# Patient Record
Sex: Female | Born: 1945
Health system: Southern US, Community
[De-identification: ages and names within clinical notes are randomized; demographics above are authoritative.]

## PROBLEM LIST (undated history)

## (undated) DIAGNOSIS — E785 Hyperlipidemia, unspecified: Secondary | ICD-10-CM

## (undated) DIAGNOSIS — I10 Essential (primary) hypertension: Secondary | ICD-10-CM

## (undated) DIAGNOSIS — K635 Polyp of colon: Secondary | ICD-10-CM

## (undated) DIAGNOSIS — M1990 Unspecified osteoarthritis, unspecified site: Secondary | ICD-10-CM

## (undated) DIAGNOSIS — T7840XA Allergy, unspecified, initial encounter: Secondary | ICD-10-CM

## (undated) HISTORY — DX: Essential (primary) hypertension: I10

## (undated) HISTORY — DX: Hyperlipidemia, unspecified: E78.5

## (undated) HISTORY — PX: FOOT SURGERY: SHX648

## (undated) HISTORY — DX: Allergy, unspecified, initial encounter: T78.40XA

## (undated) HISTORY — DX: Unspecified osteoarthritis, unspecified site: M19.90

## (undated) HISTORY — DX: Polyp of colon: K63.5

---

## 2004-10-05 ENCOUNTER — Other Ambulatory Visit: Admission: RE | Admit: 2004-10-05 | Discharge: 2004-10-05 | Payer: Self-pay | Admitting: Family Medicine

## 2004-10-30 ENCOUNTER — Encounter: Admission: RE | Admit: 2004-10-30 | Discharge: 2004-10-30 | Payer: Self-pay | Admitting: Family Medicine

## 2005-11-29 ENCOUNTER — Encounter: Admission: RE | Admit: 2005-11-29 | Discharge: 2005-11-29 | Payer: Self-pay | Admitting: Family Medicine

## 2005-12-13 ENCOUNTER — Ambulatory Visit: Payer: Self-pay | Admitting: Gastroenterology

## 2005-12-22 ENCOUNTER — Encounter (INDEPENDENT_AMBULATORY_CARE_PROVIDER_SITE_OTHER): Payer: Self-pay | Admitting: *Deleted

## 2005-12-22 ENCOUNTER — Ambulatory Visit: Payer: Self-pay | Admitting: Gastroenterology

## 2006-04-28 ENCOUNTER — Ambulatory Visit: Payer: Self-pay | Admitting: Family Medicine

## 2006-05-30 ENCOUNTER — Ambulatory Visit: Payer: Self-pay | Admitting: Family Medicine

## 2007-03-14 ENCOUNTER — Other Ambulatory Visit: Admission: RE | Admit: 2007-03-14 | Discharge: 2007-03-14 | Payer: Self-pay | Admitting: Family Medicine

## 2007-03-14 ENCOUNTER — Ambulatory Visit: Payer: Self-pay | Admitting: Family Medicine

## 2007-04-04 ENCOUNTER — Encounter: Admission: RE | Admit: 2007-04-04 | Discharge: 2007-04-04 | Payer: Self-pay | Admitting: Family Medicine

## 2007-04-14 ENCOUNTER — Ambulatory Visit: Payer: Self-pay | Admitting: Family Medicine

## 2007-06-09 ENCOUNTER — Ambulatory Visit: Payer: Self-pay | Admitting: Family Medicine

## 2007-07-11 ENCOUNTER — Ambulatory Visit: Payer: Self-pay | Admitting: Family Medicine

## 2008-04-16 ENCOUNTER — Encounter: Admission: RE | Admit: 2008-04-16 | Discharge: 2008-04-16 | Payer: Self-pay | Admitting: Family Medicine

## 2008-05-01 ENCOUNTER — Ambulatory Visit: Payer: Self-pay | Admitting: Family Medicine

## 2008-05-03 ENCOUNTER — Ambulatory Visit: Payer: Self-pay | Admitting: Family Medicine

## 2009-04-10 ENCOUNTER — Ambulatory Visit: Payer: Self-pay | Admitting: Family Medicine

## 2009-04-17 ENCOUNTER — Encounter: Admission: RE | Admit: 2009-04-17 | Discharge: 2009-04-17 | Payer: Self-pay | Admitting: Family Medicine

## 2009-04-30 ENCOUNTER — Ambulatory Visit: Payer: Self-pay | Admitting: Family Medicine

## 2010-04-03 ENCOUNTER — Emergency Department (HOSPITAL_COMMUNITY): Admission: EM | Admit: 2010-04-03 | Discharge: 2010-04-03 | Payer: Self-pay | Admitting: Emergency Medicine

## 2010-04-10 ENCOUNTER — Ambulatory Visit: Payer: Self-pay | Admitting: Family Medicine

## 2010-04-30 ENCOUNTER — Ambulatory Visit: Payer: Self-pay | Admitting: Family Medicine

## 2010-05-04 ENCOUNTER — Encounter: Admission: RE | Admit: 2010-05-04 | Discharge: 2010-05-04 | Payer: Self-pay | Admitting: Family Medicine

## 2011-02-04 ENCOUNTER — Encounter: Payer: Self-pay | Admitting: Gastroenterology

## 2011-04-01 ENCOUNTER — Encounter: Payer: Self-pay | Admitting: Family Medicine

## 2011-04-01 ENCOUNTER — Other Ambulatory Visit: Payer: Self-pay | Admitting: Family Medicine

## 2011-04-01 DIAGNOSIS — Z1231 Encounter for screening mammogram for malignant neoplasm of breast: Secondary | ICD-10-CM

## 2011-05-06 ENCOUNTER — Encounter: Payer: Self-pay | Admitting: Gastroenterology

## 2011-05-06 ENCOUNTER — Ambulatory Visit
Admission: RE | Admit: 2011-05-06 | Discharge: 2011-05-06 | Disposition: A | Discharge status: Home / Self Care | Payer: BC Managed Care – PPO | Source: Ambulatory Visit | Attending: Family Medicine | Admitting: Family Medicine

## 2011-05-06 DIAGNOSIS — Z1231 Encounter for screening mammogram for malignant neoplasm of breast: Secondary | ICD-10-CM

## 2011-05-07 ENCOUNTER — Other Ambulatory Visit: Payer: Self-pay | Admitting: Family Medicine

## 2011-05-07 NOTE — Telephone Encounter (Signed)
Just sent approval

## 2011-05-13 ENCOUNTER — Telehealth: Payer: Self-pay | Admitting: *Deleted

## 2011-05-13 NOTE — Telephone Encounter (Signed)
Dr. Jarold Motto-- I am putting pt's chart on your desk for review.  Last colonoscopy 2007-hyperplastic polyps.  No family history of colon cancer or colon polyps. Pt is not currently having problems.  Recall sheet recommends recall 2012.  Is pt due for recall 16109 or 2017? Ezra Sites

## 2011-05-13 NOTE — Telephone Encounter (Signed)
2017

## 2011-05-13 NOTE — Telephone Encounter (Signed)
Pt notified of colon recall 2017.  Recall for 2017 entered into computer. Danielle Yoder

## 2011-05-26 ENCOUNTER — Other Ambulatory Visit: Payer: BC Managed Care – PPO | Admitting: Gastroenterology

## 2011-05-31 ENCOUNTER — Ambulatory Visit (INDEPENDENT_AMBULATORY_CARE_PROVIDER_SITE_OTHER): Payer: BC Managed Care – PPO | Admitting: Family Medicine

## 2011-05-31 ENCOUNTER — Encounter: Payer: Self-pay | Admitting: Family Medicine

## 2011-05-31 VITALS — BP 124/74 | HR 64 | Ht 60.0 in | Wt 137.0 lb

## 2011-05-31 DIAGNOSIS — M129 Arthropathy, unspecified: Secondary | ICD-10-CM

## 2011-05-31 DIAGNOSIS — Z23 Encounter for immunization: Secondary | ICD-10-CM

## 2011-05-31 DIAGNOSIS — Z8249 Family history of ischemic heart disease and other diseases of the circulatory system: Secondary | ICD-10-CM | POA: Insufficient documentation

## 2011-05-31 DIAGNOSIS — E785 Hyperlipidemia, unspecified: Secondary | ICD-10-CM

## 2011-05-31 DIAGNOSIS — M1990 Unspecified osteoarthritis, unspecified site: Secondary | ICD-10-CM | POA: Insufficient documentation

## 2011-05-31 DIAGNOSIS — Z860100 Personal history of colon polyps, unspecified: Secondary | ICD-10-CM | POA: Insufficient documentation

## 2011-05-31 DIAGNOSIS — Z0000 Encounter for general adult medical examination without abnormal findings: Secondary | ICD-10-CM

## 2011-05-31 DIAGNOSIS — J301 Allergic rhinitis due to pollen: Secondary | ICD-10-CM

## 2011-05-31 DIAGNOSIS — I10 Essential (primary) hypertension: Secondary | ICD-10-CM

## 2011-05-31 LAB — CBC WITH DIFFERENTIAL/PLATELET
Eosinophils Absolute: 0.3 10*3/uL (ref 0.0–0.7)
Eosinophils Relative: 5 % (ref 0–5)
Hemoglobin: 12.7 g/dL (ref 12.0–15.0)
Lymphs Abs: 2.4 10*3/uL (ref 0.7–4.0)
MCH: 27.5 pg (ref 26.0–34.0)
MCV: 84.2 fL (ref 78.0–100.0)
Monocytes Relative: 9 % (ref 3–12)
RBC: 4.62 MIL/uL (ref 3.87–5.11)

## 2011-05-31 LAB — LIPID PANEL
Cholesterol: 167 mg/dL (ref 0–200)
Triglycerides: 110 mg/dL (ref <150)
VLDL: 22 mg/dL (ref 0–40)

## 2011-05-31 LAB — COMPREHENSIVE METABOLIC PANEL
CO2: 26 mEq/L (ref 19–32)
Calcium: 10 mg/dL (ref 8.4–10.5)
Creat: 0.69 mg/dL (ref 0.50–1.10)
Glucose, Bld: 86 mg/dL (ref 70–99)
Sodium: 141 mEq/L (ref 135–145)
Total Bilirubin: 0.5 mg/dL (ref 0.3–1.2)
Total Protein: 7 g/dL (ref 6.0–8.3)

## 2011-05-31 LAB — POCT URINALYSIS DIPSTICK
Bilirubin, UA: NEGATIVE
Blood, UA: NEGATIVE
Glucose, UA: NEGATIVE
Ketones, UA: NEGATIVE
Spec Grav, UA: 1.02

## 2011-05-31 NOTE — Patient Instructions (Signed)
Continue with your exercise and cut at one more day to this. Continue on your present medications. We will call you with the results of your blood work.

## 2011-05-31 NOTE — Progress Notes (Signed)
Subjective:    Patient ID: Danielle Yoder, female    DOB: 1945-10-27, 65 y.o.   MRN: [id number]  HPI She is here for a complete examination. She does have a history of colonic polyps and recently check with her gastroenterologist. He informed her that it will be on a ten-year cycle. She continues on Lipitor and is having no difficulty with this. She also continues on her blood pressure medications again without trouble. Her allergies are under good control. She has had some difficulty with constipation. Arthritis gives her very little trouble. Family history is significant for brother having a stent at age 57. She is now retired and enjoying her retirement. She does exercise 3 or 4 days per week.   Review of Systems  Constitutional: Negative.   HENT: Negative.   Eyes: Negative.   Respiratory: Negative.   Cardiovascular: Negative.   Gastrointestinal: Negative.   Genitourinary: Negative.   Musculoskeletal: Positive for arthralgias.  Neurological: Negative.   Hematological: Negative.   Psychiatric/Behavioral: Negative.        Objective:   Physical Exam BP 124/74  Pulse 64  Ht 5' (1.524 m)  Wt 137 lb (62.143 kg)  BMI 26.76 kg/m2  General Appearance:    Alert, cooperative, no distress, appears stated age  Head:    Normocephalic, without obvious abnormality, atraumatic  Eyes:    PERRL, conjunctiva/corneas clear, EOM's intact, fundi    benign  Ears:    Normal TM's and external ear canals  Nose:   Nares normal, mucosa normal, no drainage or sinus   tenderness  Throat:   Lips, mucosa, and tongue normal; teeth and gums normal  Neck:   Supple, no lymphadenopathy;  thyroid:  no   enlargement/tenderness/nodules; no carotid   bruit or JVD  Back:    Spine nontender, no curvature, ROM normal, no CVA     tenderness  Lungs:     Clear to auscultation bilaterally without wheezes, rales or     ronchi; respirations unlabored  Chest Wall:    No tenderness or deformity   Heart:    Regular rate and  rhythm, S1 and S2 normal, no murmur, rub   or gallop  Breast Exam:    Deferred to GYN  Abdomen:     Soft, non-tender, nondistended, normoactive bowel sounds,    no masses, no hepatosplenomegaly  Genitalia:    Deferred to GYN     Extremities:   No clubbing, cyanosis or edema  Pulses:   2+ and symmetric all extremities  Skin:   Skin color, texture, turgor normal, no rashes or lesions  Lymph nodes:   Cervical, supraclavicular, and axillary nodes normal  Neurologic:   CNII-XII intact, normal strength, sensation and gait; reflexes 2+ and symmetric throughout          Psych:   Normal mood, affect, hygiene and grooming.   EKG does show some T-wave inversions in the lateral leads as well as V3        Assessment & Plan:   1. Physical exam, annual  POCT Urinalysis Dipstick, CBC w/Diff, Comprehensive metabolic panel, Lipid panel, PR ELECTROCARDIOGRAM, COMPLETE  2. Family history of heart disease in female family member before age 36  Ambulatory referral to Cardiology, PR ELECTROCARDIOGRAM, COMPLETE  3. Arthritis    4. Hyperlipidemia LDL goal < 100  Lipid panel  5. Hypertension  CBC w/Diff, Comprehensive metabolic panel, Lipid panel  6. Allergic rhinitis due to pollen    7. History of colonic polyps  encouraged her to continue with her diet and exercise regimen.

## 2011-06-01 ENCOUNTER — Ambulatory Visit
Admission: RE | Admit: 2011-06-01 | Discharge: 2011-06-01 | Disposition: A | Discharge status: Home / Self Care | Payer: BC Managed Care – PPO | Source: Ambulatory Visit | Attending: Cardiology | Admitting: Cardiology

## 2011-06-01 ENCOUNTER — Telehealth: Payer: Self-pay

## 2011-06-01 ENCOUNTER — Other Ambulatory Visit: Payer: Self-pay | Admitting: Cardiology

## 2011-06-01 DIAGNOSIS — I119 Hypertensive heart disease without heart failure: Secondary | ICD-10-CM

## 2011-06-01 NOTE — Telephone Encounter (Signed)
Called pt to let her know labs look good pt informed

## 2011-06-02 ENCOUNTER — Other Ambulatory Visit: Payer: BC Managed Care – PPO | Admitting: Gastroenterology

## 2011-06-05 ENCOUNTER — Other Ambulatory Visit: Payer: Self-pay | Admitting: Family Medicine

## 2011-06-07 ENCOUNTER — Telehealth: Payer: Self-pay | Admitting: Family Medicine

## 2011-06-07 MED ORDER — HYDROCHLOROTHIAZIDE 12.5 MG PO CAPS: 12.5000 mg | ORAL_CAPSULE | ORAL | Status: DC

## 2011-06-07 MED ORDER — ATORVASTATIN CALCIUM 40 MG PO TABS: 40.0000 mg | ORAL_TABLET | Freq: Every day | ORAL | Status: DC

## 2011-06-07 MED ORDER — HYDROCHLOROTHIAZIDE 25 MG PO TABS: 25.0000 mg | ORAL_TABLET | Freq: Every day | ORAL | Status: DC

## 2011-06-07 MED ORDER — VERAPAMIL HCL ER 240 MG PO TBCR: 240.0000 mg | EXTENDED_RELEASE_TABLET | Freq: Every day | ORAL | Status: DC

## 2011-06-07 NOTE — Telephone Encounter (Signed)
Pt called and request refills med refilled

## 2011-09-29 ENCOUNTER — Telehealth: Payer: Self-pay | Admitting: Internal Medicine

## 2011-09-29 MED ORDER — VERAPAMIL HCL ER 240 MG PO TBCR: 240.0000 mg | EXTENDED_RELEASE_TABLET | Freq: Every day | ORAL | Status: DC

## 2011-09-29 MED ORDER — ATORVASTATIN CALCIUM 40 MG PO TABS: 40.0000 mg | ORAL_TABLET | Freq: Every day | ORAL | Status: DC

## 2011-09-29 MED ORDER — HYDROCHLOROTHIAZIDE 12.5 MG PO CAPS: 12.5000 mg | ORAL_CAPSULE | ORAL | Status: DC

## 2011-09-29 NOTE — Telephone Encounter (Signed)
Sent med in 

## 2011-09-29 NOTE — Telephone Encounter (Signed)
Sent meds in to [facility]

## 2011-12-14 ENCOUNTER — Other Ambulatory Visit: Payer: Self-pay | Admitting: Family Medicine

## 2012-01-25 ENCOUNTER — Telehealth: Payer: Self-pay | Admitting: Family Medicine

## 2012-01-25 NOTE — Telephone Encounter (Signed)
lm

## 2012-03-12 ENCOUNTER — Other Ambulatory Visit: Payer: Self-pay | Admitting: Family Medicine

## 2012-03-16 ENCOUNTER — Other Ambulatory Visit: Payer: Self-pay | Admitting: Family Medicine

## 2012-03-29 ENCOUNTER — Other Ambulatory Visit: Payer: Self-pay | Admitting: *Deleted

## 2012-03-29 MED ORDER — ATORVASTATIN CALCIUM 40 MG PO TABS: 40.0000 mg | ORAL_TABLET | Freq: Every day | ORAL | Status: DC

## 2012-04-06 ENCOUNTER — Ambulatory Visit (INDEPENDENT_AMBULATORY_CARE_PROVIDER_SITE_OTHER): Payer: Medicare Other | Admitting: Family Medicine

## 2012-04-06 ENCOUNTER — Encounter: Payer: Self-pay | Admitting: Family Medicine

## 2012-04-06 ENCOUNTER — Other Ambulatory Visit: Payer: Self-pay

## 2012-04-06 VITALS — BP 122/70 | HR 61 | Wt 129.0 lb

## 2012-04-06 DIAGNOSIS — M1990 Unspecified osteoarthritis, unspecified site: Secondary | ICD-10-CM

## 2012-04-06 DIAGNOSIS — Z79899 Other long term (current) drug therapy: Secondary | ICD-10-CM

## 2012-04-06 DIAGNOSIS — Z86010 Personal history of colonic polyps: Secondary | ICD-10-CM

## 2012-04-06 DIAGNOSIS — M129 Arthropathy, unspecified: Secondary | ICD-10-CM

## 2012-04-06 DIAGNOSIS — I10 Essential (primary) hypertension: Secondary | ICD-10-CM

## 2012-04-06 DIAGNOSIS — J301 Allergic rhinitis due to pollen: Secondary | ICD-10-CM

## 2012-04-06 DIAGNOSIS — Z8249 Family history of ischemic heart disease and other diseases of the circulatory system: Secondary | ICD-10-CM

## 2012-04-06 DIAGNOSIS — E785 Hyperlipidemia, unspecified: Secondary | ICD-10-CM

## 2012-04-06 LAB — CBC WITH DIFFERENTIAL/PLATELET
Basophils Relative: 1 % (ref 0–1)
Hemoglobin: 13.4 g/dL (ref 12.0–15.0)
Lymphocytes Relative: 40 % (ref 12–46)
Lymphs Abs: 2.3 10*3/uL (ref 0.7–4.0)
MCHC: 34.7 g/dL (ref 30.0–36.0)
Monocytes Relative: 8 % (ref 3–12)
Neutro Abs: 2.7 10*3/uL (ref 1.7–7.7)
Neutrophils Relative %: 48 % (ref 43–77)
RBC: 4.8 MIL/uL (ref 3.87–5.11)
WBC: 5.7 10*3/uL (ref 4.0–10.5)

## 2012-04-06 LAB — COMPREHENSIVE METABOLIC PANEL
Albumin: 4.2 g/dL (ref 3.5–5.2)
Alkaline Phosphatase: 57 U/L (ref 39–117)
Chloride: 105 mEq/L (ref 96–112)
Glucose, Bld: 79 mg/dL (ref 70–99)
Potassium: 4.3 mEq/L (ref 3.5–5.3)
Sodium: 140 mEq/L (ref 135–145)
Total Protein: 6.9 g/dL (ref 6.0–8.3)

## 2012-04-06 LAB — LIPID PANEL
LDL Cholesterol: 86 mg/dL (ref 0–99)
Triglycerides: 79 mg/dL (ref <150)

## 2012-04-06 MED ORDER — HYDROCHLOROTHIAZIDE 12.5 MG PO CAPS: 12.5000 mg | ORAL_CAPSULE | ORAL | Status: DC

## 2012-04-06 MED ORDER — ATORVASTATIN CALCIUM 40 MG PO TABS: 40.0000 mg | ORAL_TABLET | Freq: Every day | ORAL | Status: DC

## 2012-04-06 MED ORDER — VERAPAMIL HCL ER 240 MG PO TBCR: 240.0000 mg | EXTENDED_RELEASE_TABLET | ORAL | Status: DC

## 2012-04-06 NOTE — Progress Notes (Signed)
  Subjective:    Patient ID: Danielle Yoder, female    DOB: [birth year], 66 y.o.   MRN: [id number]  HPI He is here for an interval evaluation. Her allergies are under good control. The arthritis gives her very little difficulty. She continues on her statin medication as well as blood pressure meds and is having no difficulty with them. She has had a colonoscopy. She keeps herself busy during her retirement. She does walk on a daily basis. She and her husband are getting along quite well. She has no concerns or complaints.   Review of Systems     Objective:   Physical Exam alert and in no distress. Tympanic membranes and canals are normal. Throat is clear. Tonsils are normal. Neck is supple without adenopathy or thyromegaly. Cardiac exam shows a regular sinus rhythm without murmurs or gallops. Lungs are clear to auscultation.        Assessment & Plan:   1. Allergic rhinitis due to pollen    2. Arthritis    3. Family history of heart disease in female family member before age 58  Lipid panel  4. Hyperlipidemia LDL goal < 100  Lipid panel  5. History of colonic polyps    6. Hypertension  CBC with Differential, Comprehensive metabolic panel, hydrochlorothiazide (MICROZIDE) 12.5 MG capsule, verapamil (CALAN-SR) 240 MG CR tablet  7. Encounter for long-term (current) use of other medications  CBC with Differential, Comprehensive metabolic panel, Lipid panel

## 2012-04-07 NOTE — Progress Notes (Signed)
Quick Note:  The blood work is normal ______ 

## 2012-04-10 ENCOUNTER — Other Ambulatory Visit: Payer: Self-pay | Admitting: Family Medicine

## 2012-04-10 DIAGNOSIS — Z1231 Encounter for screening mammogram for malignant neoplasm of breast: Secondary | ICD-10-CM

## 2012-04-10 NOTE — Progress Notes (Signed)
Called pt home # left message word for word

## 2012-05-08 ENCOUNTER — Ambulatory Visit
Admission: RE | Admit: 2012-05-08 | Discharge: 2012-05-08 | Disposition: A | Discharge status: Home / Self Care | Payer: Medicare Other | Source: Ambulatory Visit | Attending: Family Medicine | Admitting: Family Medicine

## 2012-05-08 DIAGNOSIS — Z1231 Encounter for screening mammogram for malignant neoplasm of breast: Secondary | ICD-10-CM

## 2012-09-10 ENCOUNTER — Other Ambulatory Visit: Payer: Self-pay | Admitting: Family Medicine

## 2012-09-26 ENCOUNTER — Other Ambulatory Visit: Payer: 59

## 2012-09-26 DIAGNOSIS — Z23 Encounter for immunization: Secondary | ICD-10-CM

## 2012-09-26 MED ORDER — INFLUENZA VIRUS VACC SPLIT PF IM SUSP: 0.5000 mL | Freq: Once | INTRAMUSCULAR | Status: DC

## 2012-10-09 ENCOUNTER — Telehealth: Payer: Self-pay | Admitting: Family Medicine

## 2012-10-09 DIAGNOSIS — I10 Essential (primary) hypertension: Secondary | ICD-10-CM

## 2012-10-09 MED ORDER — ATORVASTATIN CALCIUM 40 MG PO TABS: 40.0000 mg | ORAL_TABLET | Freq: Every day | ORAL | Status: DC

## 2012-10-09 MED ORDER — HYDROCHLOROTHIAZIDE 12.5 MG PO CAPS: 12.5000 mg | ORAL_CAPSULE | ORAL | Status: DC

## 2012-10-09 MED ORDER — VERAPAMIL HCL ER 240 MG PO TBCR: 240.0000 mg | EXTENDED_RELEASE_TABLET | ORAL | Status: DC

## 2012-10-09 NOTE — Telephone Encounter (Signed)
Sent all meds with 90days to [facility] by [employer]

## 2012-10-09 NOTE — Telephone Encounter (Signed)
Ahead and set this up it looks like it's been a while since her last physical so work out with her as well

## 2013-05-07 ENCOUNTER — Other Ambulatory Visit: Payer: Self-pay

## 2013-05-07 DIAGNOSIS — Z1231 Encounter for screening mammogram for malignant neoplasm of breast: Secondary | ICD-10-CM

## 2013-05-22 ENCOUNTER — Ambulatory Visit
Admission: RE | Admit: 2013-05-22 | Discharge: 2013-05-22 | Disposition: A | Discharge status: Home / Self Care | Payer: Medicare Other | Source: Ambulatory Visit

## 2013-05-22 DIAGNOSIS — Z1231 Encounter for screening mammogram for malignant neoplasm of breast: Secondary | ICD-10-CM

## 2013-07-05 ENCOUNTER — Other Ambulatory Visit: Payer: Self-pay | Admitting: Family Medicine

## 2013-09-18 ENCOUNTER — Other Ambulatory Visit (INDEPENDENT_AMBULATORY_CARE_PROVIDER_SITE_OTHER): Payer: Medicare Other

## 2013-09-18 DIAGNOSIS — Z23 Encounter for immunization: Secondary | ICD-10-CM

## 2014-01-03 ENCOUNTER — Other Ambulatory Visit: Payer: Self-pay | Admitting: Family Medicine

## 2014-01-07 ENCOUNTER — Encounter: Payer: Self-pay | Admitting: Family Medicine

## 2014-01-07 ENCOUNTER — Ambulatory Visit (INDEPENDENT_AMBULATORY_CARE_PROVIDER_SITE_OTHER): Payer: Medicare Other | Admitting: Family Medicine

## 2014-01-07 ENCOUNTER — Other Ambulatory Visit: Payer: Self-pay | Admitting: Family Medicine

## 2014-01-07 VITALS — BP 118/80 | HR 60 | Wt 137.0 lb

## 2014-01-07 DIAGNOSIS — Z860100 Personal history of colon polyps, unspecified: Secondary | ICD-10-CM

## 2014-01-07 DIAGNOSIS — M1990 Unspecified osteoarthritis, unspecified site: Secondary | ICD-10-CM

## 2014-01-07 DIAGNOSIS — J301 Allergic rhinitis due to pollen: Secondary | ICD-10-CM

## 2014-01-07 DIAGNOSIS — M129 Arthropathy, unspecified: Secondary | ICD-10-CM

## 2014-01-07 DIAGNOSIS — Z23 Encounter for immunization: Secondary | ICD-10-CM

## 2014-01-07 DIAGNOSIS — E785 Hyperlipidemia, unspecified: Secondary | ICD-10-CM

## 2014-01-07 DIAGNOSIS — Z8249 Family history of ischemic heart disease and other diseases of the circulatory system: Secondary | ICD-10-CM

## 2014-01-07 DIAGNOSIS — I10 Essential (primary) hypertension: Secondary | ICD-10-CM

## 2014-01-07 DIAGNOSIS — Z79899 Other long term (current) drug therapy: Secondary | ICD-10-CM

## 2014-01-07 LAB — COMPREHENSIVE METABOLIC PANEL
ALBUMIN: 4.2 g/dL (ref 3.5–5.2)
ALT: 19 U/L (ref 0–35)
AST: 19 U/L (ref 0–37)
Alkaline Phosphatase: 64 U/L (ref 39–117)
BILIRUBIN TOTAL: 0.4 mg/dL (ref 0.2–1.2)
BUN: 11 mg/dL (ref 6–23)
CO2: 26 meq/L (ref 19–32)
Calcium: 10 mg/dL (ref 8.4–10.5)
Chloride: 104 mEq/L (ref 96–112)
Creat: 0.75 mg/dL (ref 0.50–1.10)
GLUCOSE: 112 mg/dL — AB (ref 70–99)
Potassium: 3.8 mEq/L (ref 3.5–5.3)
Sodium: 141 mEq/L (ref 135–145)
Total Protein: 6.8 g/dL (ref 6.0–8.3)

## 2014-01-07 LAB — CBC WITH DIFFERENTIAL/PLATELET
Basophils Absolute: 0.1 10*3/uL (ref 0.0–0.1)
Basophils Relative: 1 % (ref 0–1)
EOS ABS: 0.4 10*3/uL (ref 0.0–0.7)
Eosinophils Relative: 7 % — ABNORMAL HIGH (ref 0–5)
HCT: 38.6 % (ref 36.0–46.0)
HEMOGLOBIN: 12.9 g/dL (ref 12.0–15.0)
LYMPHS ABS: 2.6 10*3/uL (ref 0.7–4.0)
LYMPHS PCT: 44 % (ref 12–46)
MCH: 26.8 pg (ref 26.0–34.0)
MCHC: 33.4 g/dL (ref 30.0–36.0)
MCV: 80.2 fL (ref 78.0–100.0)
MONOS PCT: 5 % (ref 3–12)
Monocytes Absolute: 0.3 10*3/uL (ref 0.1–1.0)
NEUTROS PCT: 43 % (ref 43–77)
Neutro Abs: 2.5 10*3/uL (ref 1.7–7.7)
Platelets: 246 10*3/uL (ref 150–400)
RBC: 4.81 MIL/uL (ref 3.87–5.11)
RDW: 14.1 % (ref 11.5–15.5)
WBC: 5.8 10*3/uL (ref 4.0–10.5)

## 2014-01-07 LAB — LIPID PANEL
Cholesterol: 155 mg/dL (ref 0–200)
HDL: 46 mg/dL (ref >39)
LDL Cholesterol: 86 mg/dL (ref 0–99)
TRIGLYCERIDES: 116 mg/dL (ref <150)
Total CHOL/HDL Ratio: 3.4 Ratio
VLDL: 23 mg/dL (ref 0–40)

## 2014-01-07 MED ORDER — ATORVASTATIN CALCIUM 40 MG PO TABS: ORAL_TABLET | ORAL | Status: DC

## 2014-01-07 MED ORDER — VERAPAMIL HCL ER 240 MG PO TBCR: 240.0000 mg | EXTENDED_RELEASE_TABLET | ORAL | Status: DC

## 2014-01-07 NOTE — Progress Notes (Signed)
   Subjective:    Patient ID: Christianne DolinJoyce A Galindo, female    DOB: 05/19/1946, 68 y.o.   MRN: [id number]  HPI   Msr. Christianne DolinJoyce A Dibenedetto is a very pleasant 68 y.o. yo female who  has a past medical history of Allergy; Arthritis; Hypertension; Hemorrhoids; Dyslipidemia; and Colonic polyp. She presents today for medication management.   The patient doing well with no acute complaints. Patient would like to know when to take her asprin, has been told to take it at night but isn't sure what is best.   Doing well overall, her allergies are well controlled and her recent blood pressures have been below 130 systolic and 90 diastolic.  She does have underlying arthritis but is not having any difficulty with that. She is now retired and enjoying her retirement. She is up-to-date on her colonoscopy. The patient needs to receive her pneumoccocal 23 today.   Review of Systems is negative except per HPI.    Objective:   Physical Exam  Constitutional: Patient is oriented to person, place, and time and well-developed, well-nourished, and in no distress. Cardiovascular: Normal rate, regular rhythm and intact distal pulses. Pulmonary/Chest: Effort normal and breath sounds normal.     Assessment & Plan:  History of colonic polyps  Arthritis  Allergic rhinitis due to pollen  Hyperlipidemia LDL goal < 100  Hypertension  Family history of heart disease in female family member before age 67  Encounter for long-term (current) use of other medications  Will order CBC, Cmet and Pneumoccol 23 today.

## 2014-01-08 LAB — HEMOGLOBIN A1C
Hgb A1c MFr Bld: 6.1 % — ABNORMAL HIGH (ref <5.7)
MEAN PLASMA GLUCOSE: 128 mg/dL — AB (ref <117)

## 2014-01-08 MED ORDER — HYDROCHLOROTHIAZIDE 12.5 MG PO CAPS: 12.5000 mg | ORAL_CAPSULE | ORAL | Status: DC

## 2014-01-08 NOTE — Addendum Note (Signed)
Addended by: Barbette OrLOWE, SABRINA A on: 01/08/2014 09:00 AM   Modules accepted: Orders

## 2014-05-14 ENCOUNTER — Other Ambulatory Visit: Payer: Self-pay

## 2014-05-14 DIAGNOSIS — Z1231 Encounter for screening mammogram for malignant neoplasm of breast: Secondary | ICD-10-CM

## 2014-05-30 ENCOUNTER — Ambulatory Visit
Admission: RE | Admit: 2014-05-30 | Discharge: 2014-05-30 | Disposition: A | Discharge status: Home / Self Care | Payer: 59 | Source: Ambulatory Visit

## 2014-05-30 DIAGNOSIS — Z1231 Encounter for screening mammogram for malignant neoplasm of breast: Secondary | ICD-10-CM

## 2015-01-08 ENCOUNTER — Ambulatory Visit (INDEPENDENT_AMBULATORY_CARE_PROVIDER_SITE_OTHER): Payer: Medicare Other | Admitting: Family Medicine

## 2015-01-08 ENCOUNTER — Encounter: Payer: Self-pay | Admitting: Family Medicine

## 2015-01-08 VITALS — BP 148/80 | HR 60 | Ht 60.0 in | Wt 135.8 lb

## 2015-01-08 DIAGNOSIS — S9031XA Contusion of right foot, initial encounter: Secondary | ICD-10-CM | POA: Diagnosis not present

## 2015-01-08 DIAGNOSIS — M25571 Pain in right ankle and joints of right foot: Secondary | ICD-10-CM

## 2015-01-08 NOTE — Progress Notes (Signed)
Chief Complaint  Patient presents with  . Toe Injury    yesterday afternoon she injured her two toes on right foot on a piece a furniture. She did ice it and took 2 advil last and again this am. She is having a pain that is going up the front of her leg. Toes are bruised. Has had 2 foot surgeries on this particular foot.    Yesterday afternoon, after putting her granddaughter to bed in her bed, while glancing back at her, her right foot hit the corner of a stand in her bedroom.  She had acute onset of pain. She iced it right away, and iced it again last night.  Advil helped with pain, but it would recur when it wore off.  She has noticed some swelling in the right foot, bruising, and occasionally has some pains that shoot up the top of the foot.  She feels like she can't bend the toes (2nd through 4th toes--these are the toes that are painful--but pain also extends into the top of the foot).  PMH, PSH, SH reviewed  Outpatient Encounter Prescriptions as of 01/08/2015  Medication Sig  . aspirin 81 MG tablet Take 81 mg by mouth daily.    Marland Kitchen. atorvastatin (LIPITOR) 40 MG tablet TAKE 1 TABLET BY MOUTH DAILY.  . Calcium-Vitamin D (CALTRATE 600 PLUS-VIT D PO) Take by mouth.    . hydrochlorothiazide (MICROZIDE) 12.5 MG capsule Take 1 capsule (12.5 mg total) by mouth 1 day or 1 dose.  . ibuprofen (ADVIL,MOTRIN) 200 MG tablet Take 400 mg by mouth every 6 (six) hours as needed.  . verapamil (CALAN-SR) 240 MG CR tablet Take 1 tablet (240 mg total) by mouth 1 day or 1 dose.   No Known Allergies  ROS: no fever, chills, URI symptoms, headaches, dizziness, chest pain, shortness of breath, GI or GU complaints. See HPI  PHYSICAL EXAM: BP 148/80 mmHg  Pulse 60  Ht 5' (1.524 m)  Wt 135 lb 12.8 oz (61.598 kg)  BMI 26.52 kg/m2  Well developed, pleasant female i no distress Right foot:  2+ pulse PT and DP. There is very minimal soft tissue swelling, along with ecchymosis noted at the distal part of her foot at  the base of the 2nd through 4th toes.  There is a slightly darker/purple bruise at the base of the 4th toe (MTP).  She is mildly tender over the proximal 3rd and 4th phalanges, and tender at the 4th MTP (where bruise is). There is no other bony tenderness.  Metatarsals are nontender.  ASSESSMENT/PLAN:  Foot contusion, right, initial encounter  Pain in joint, ankle and foot, right - Plan: DG Foot Complete Right   Right foot pain--suspect contusion rather than fracture.  Go for x-ray in 1-2 weeks if ongoing pain (sooner if worsening pain, swelling). You may use ibuprofen (advil) and/or tylenol as needed for pain. Continue to ice while there is still swelling present.

## 2015-01-08 NOTE — Patient Instructions (Signed)
   Right foot pain--suspect contusion rather than fracture.  Go for x-ray in 1-2 weeks if ongoing pain (sooner if worsening pain, swelling). You may use ibuprofen (advil) and/or tylenol as needed for pain. Continue to ice while there is still swelling present.  [facility], [facility] ([address])

## 2015-01-09 ENCOUNTER — Other Ambulatory Visit: Payer: Self-pay | Admitting: Family Medicine

## 2015-01-13 ENCOUNTER — Telehealth: Payer: Self-pay | Admitting: Family Medicine

## 2015-01-13 DIAGNOSIS — I10 Essential (primary) hypertension: Secondary | ICD-10-CM

## 2015-01-13 MED ORDER — ATORVASTATIN CALCIUM 40 MG PO TABS: ORAL_TABLET | ORAL | Status: DC

## 2015-01-13 MED ORDER — VERAPAMIL HCL ER 240 MG PO TBCR: 240.0000 mg | EXTENDED_RELEASE_TABLET | ORAL | Status: DC

## 2015-01-13 MED ORDER — HYDROCHLOROTHIAZIDE 12.5 MG PO CAPS: 12.5000 mg | ORAL_CAPSULE | ORAL | Status: DC

## 2015-01-13 NOTE — Telephone Encounter (Signed)
done

## 2015-01-13 NOTE — Telephone Encounter (Signed)
Pt schedule a medcheck appt for 5/17 but needs refills until them. She needs lipitor, microzide and verapamil [facility].

## 2015-01-15 ENCOUNTER — Ambulatory Visit
Admission: RE | Admit: 2015-01-15 | Discharge: 2015-01-15 | Disposition: A | Discharge status: Home / Self Care | Payer: Medicare Other | Source: Ambulatory Visit | Attending: Family Medicine | Admitting: Family Medicine

## 2015-01-15 DIAGNOSIS — M25571 Pain in right ankle and joints of right foot: Secondary | ICD-10-CM

## 2015-01-28 ENCOUNTER — Ambulatory Visit (INDEPENDENT_AMBULATORY_CARE_PROVIDER_SITE_OTHER): Payer: Medicare Other | Admitting: Family Medicine

## 2015-01-28 ENCOUNTER — Encounter: Payer: Self-pay | Admitting: Family Medicine

## 2015-01-28 VITALS — BP 130/90 | HR 60 | Wt 136.2 lb

## 2015-01-28 DIAGNOSIS — Z86010 Personal history of colonic polyps: Secondary | ICD-10-CM | POA: Diagnosis not present

## 2015-01-28 DIAGNOSIS — M1990 Unspecified osteoarthritis, unspecified site: Secondary | ICD-10-CM | POA: Diagnosis not present

## 2015-01-28 DIAGNOSIS — R7302 Impaired glucose tolerance (oral): Secondary | ICD-10-CM | POA: Diagnosis not present

## 2015-01-28 DIAGNOSIS — Z8249 Family history of ischemic heart disease and other diseases of the circulatory system: Secondary | ICD-10-CM

## 2015-01-28 DIAGNOSIS — J301 Allergic rhinitis due to pollen: Secondary | ICD-10-CM | POA: Diagnosis not present

## 2015-01-28 DIAGNOSIS — E785 Hyperlipidemia, unspecified: Secondary | ICD-10-CM

## 2015-01-28 DIAGNOSIS — I10 Essential (primary) hypertension: Secondary | ICD-10-CM

## 2015-01-28 LAB — CBC WITH DIFFERENTIAL/PLATELET
BASOS ABS: 0 10*3/uL (ref 0.0–0.1)
BASOS PCT: 0 % (ref 0–1)
EOS PCT: 7 % — AB (ref 0–5)
Eosinophils Absolute: 0.4 10*3/uL (ref 0.0–0.7)
HEMATOCRIT: 39.6 % (ref 36.0–46.0)
Hemoglobin: 13.2 g/dL (ref 12.0–15.0)
Lymphocytes Relative: 36 % (ref 12–46)
Lymphs Abs: 2.1 10*3/uL (ref 0.7–4.0)
MCH: 27.4 pg (ref 26.0–34.0)
MCHC: 33.3 g/dL (ref 30.0–36.0)
MCV: 82.3 fL (ref 78.0–100.0)
MONO ABS: 0.5 10*3/uL (ref 0.1–1.0)
MPV: 10.7 fL (ref 8.6–12.4)
Monocytes Relative: 9 % (ref 3–12)
NEUTROS ABS: 2.7 10*3/uL (ref 1.7–7.7)
Neutrophils Relative %: 48 % (ref 43–77)
PLATELETS: 255 10*3/uL (ref 150–400)
RBC: 4.81 MIL/uL (ref 3.87–5.11)
RDW: 14.1 % (ref 11.5–15.5)
WBC: 5.7 10*3/uL (ref 4.0–10.5)

## 2015-01-28 LAB — COMPREHENSIVE METABOLIC PANEL
ALT: 20 U/L (ref 0–35)
AST: 16 U/L (ref 0–37)
Albumin: 4.1 g/dL (ref 3.5–5.2)
Alkaline Phosphatase: 54 U/L (ref 39–117)
BILIRUBIN TOTAL: 0.6 mg/dL (ref 0.2–1.2)
BUN: 16 mg/dL (ref 6–23)
CO2: 24 mEq/L (ref 19–32)
CREATININE: 0.68 mg/dL (ref 0.50–1.10)
Calcium: 10 mg/dL (ref 8.4–10.5)
Chloride: 105 mEq/L (ref 96–112)
Glucose, Bld: 78 mg/dL (ref 70–99)
Potassium: 4.3 mEq/L (ref 3.5–5.3)
Sodium: 140 mEq/L (ref 135–145)
Total Protein: 7.1 g/dL (ref 6.0–8.3)

## 2015-01-28 LAB — LIPID PANEL
Cholesterol: 168 mg/dL (ref 0–200)
HDL: 51 mg/dL (ref >=46)
LDL CALC: 93 mg/dL (ref 0–99)
TRIGLYCERIDES: 121 mg/dL (ref <150)
Total CHOL/HDL Ratio: 3.3 Ratio
VLDL: 24 mg/dL (ref 0–40)

## 2015-01-28 MED ORDER — HYDROCHLOROTHIAZIDE 12.5 MG PO CAPS: 12.5000 mg | ORAL_CAPSULE | Freq: Every day | ORAL | Status: DC

## 2015-01-28 MED ORDER — VERAPAMIL HCL ER 240 MG PO TBCR: 240.0000 mg | EXTENDED_RELEASE_TABLET | Freq: Every day | ORAL | Status: DC

## 2015-01-28 MED ORDER — ATORVASTATIN CALCIUM 40 MG PO TABS: ORAL_TABLET | ORAL | Status: DC

## 2015-01-28 NOTE — Progress Notes (Signed)
   Subjective:    Patient ID: Danielle Yoder, female    DOB: 11/23/1945, 69 y.o.   MRN: [id number]  HPI She is here for medication check. She continues on her blood pressure medications and is having no difficulty with them. She also continues on atorvastatin again with no joint aches or pains or myalgias. She does have a history of colonic polyps and is scheduled for repeat colonoscopy next year. Family history of heart disease is noted. Her arthritis is giving her very little difficulty. She continues to treat her allergies with OTC medications. Review of her record indicates she did have an hemoglobin A1c of 6.1. She keeps himself quite busy. She is retired and enjoying this. Her marriage is going well. Family and social history was reviewed as well as health maintenance.   Review of Systems     Objective:   Physical Exam Alert and in no distress. Tympanic membranes and canals are normal. Pharyngeal area is normal. Neck is supple without adenopathy or thyromegaly. Cardiac exam shows a regular sinus rhythm without murmurs or gallops. Lungs are clear to auscultation.Abdominal exam shows normal bowel sounds without hepatosplenomegaly.       Assessment & Plan:  Essential hypertension - Plan: CBC with Differential/Platelet, Comprehensive metabolic panel, verapamil (CALAN-SR) 240 MG CR tablet, hydrochlorothiazide (MICROZIDE) 12.5 MG capsule  Hyperlipidemia with target LDL less than 100 - Plan: Lipid panel, atorvastatin (LIPITOR) 40 MG tablet  Family history of heart disease in female family member before age 68 - Plan: CBC with Differential/Platelet, Comprehensive metabolic panel, Lipid panel  History of colonic polyps  Arthritis  Allergic rhinitis due to pollen  Glucose intolerance (impaired glucose tolerance) I encouraged her to continue to take good care of herself.

## 2015-04-10 ENCOUNTER — Other Ambulatory Visit: Payer: Self-pay | Admitting: Family Medicine

## 2015-05-07 ENCOUNTER — Other Ambulatory Visit: Payer: Self-pay

## 2015-05-07 DIAGNOSIS — Z1231 Encounter for screening mammogram for malignant neoplasm of breast: Secondary | ICD-10-CM

## 2015-06-16 ENCOUNTER — Ambulatory Visit
Admission: RE | Admit: 2015-06-16 | Discharge: 2015-06-16 | Disposition: A | Discharge status: Home / Self Care | Payer: Medicare Other | Source: Ambulatory Visit

## 2015-06-16 DIAGNOSIS — Z1231 Encounter for screening mammogram for malignant neoplasm of breast: Secondary | ICD-10-CM

## 2015-06-16 LAB — HM MAMMOGRAPHY: HM Mammogram: NORMAL (ref 0–4)

## 2016-01-12 ENCOUNTER — Encounter: Payer: Self-pay | Admitting: Internal Medicine

## 2016-03-25 ENCOUNTER — Encounter: Payer: Self-pay | Admitting: Family Medicine

## 2016-03-25 ENCOUNTER — Ambulatory Visit (INDEPENDENT_AMBULATORY_CARE_PROVIDER_SITE_OTHER): Payer: Medicare Other | Admitting: Family Medicine

## 2016-03-25 VITALS — BP 128/80 | HR 64 | Ht 60.0 in | Wt 135.6 lb

## 2016-03-25 DIAGNOSIS — Z1211 Encounter for screening for malignant neoplasm of colon: Secondary | ICD-10-CM

## 2016-03-25 DIAGNOSIS — M1990 Unspecified osteoarthritis, unspecified site: Secondary | ICD-10-CM

## 2016-03-25 DIAGNOSIS — J301 Allergic rhinitis due to pollen: Secondary | ICD-10-CM

## 2016-03-25 DIAGNOSIS — M810 Age-related osteoporosis without current pathological fracture: Secondary | ICD-10-CM | POA: Diagnosis not present

## 2016-03-25 DIAGNOSIS — Z8249 Family history of ischemic heart disease and other diseases of the circulatory system: Secondary | ICD-10-CM

## 2016-03-25 DIAGNOSIS — I10 Essential (primary) hypertension: Secondary | ICD-10-CM

## 2016-03-25 DIAGNOSIS — Z23 Encounter for immunization: Secondary | ICD-10-CM | POA: Diagnosis not present

## 2016-03-25 DIAGNOSIS — Z1159 Encounter for screening for other viral diseases: Secondary | ICD-10-CM | POA: Diagnosis not present

## 2016-03-25 DIAGNOSIS — M79672 Pain in left foot: Secondary | ICD-10-CM

## 2016-03-25 DIAGNOSIS — G8929 Other chronic pain: Secondary | ICD-10-CM | POA: Diagnosis not present

## 2016-03-25 DIAGNOSIS — E785 Hyperlipidemia, unspecified: Secondary | ICD-10-CM

## 2016-03-25 LAB — CBC WITH DIFFERENTIAL/PLATELET
BASOS ABS: 0 {cells}/uL (ref 0–200)
Basophils Relative: 0 %
EOS PCT: 7 %
Eosinophils Absolute: 392 cells/uL (ref 15–500)
HCT: 39.9 % (ref 35.0–45.0)
Hemoglobin: 13.1 g/dL (ref 11.7–15.5)
LYMPHS PCT: 40 %
Lymphs Abs: 2240 cells/uL (ref 850–3900)
MCH: 27.1 pg (ref 27.0–33.0)
MCHC: 32.8 g/dL (ref 32.0–36.0)
MCV: 82.4 fL (ref 80.0–100.0)
MONOS PCT: 9 %
MPV: 10.4 fL (ref 7.5–12.5)
Monocytes Absolute: 504 cells/uL (ref 200–950)
NEUTROS PCT: 44 %
Neutro Abs: 2464 cells/uL (ref 1500–7800)
Platelets: 251 10*3/uL (ref 140–400)
RBC: 4.84 MIL/uL (ref 3.80–5.10)
RDW: 14.1 % (ref 11.0–15.0)
WBC: 5.6 10*3/uL (ref 4.0–10.5)

## 2016-03-25 MED ORDER — ATORVASTATIN CALCIUM 40 MG PO TABS: ORAL_TABLET | ORAL | Status: DC

## 2016-03-25 MED ORDER — VERAPAMIL HCL ER 240 MG PO TBCR: 240.0000 mg | EXTENDED_RELEASE_TABLET | Freq: Every day | ORAL | Status: DC

## 2016-03-25 MED ORDER — HYDROCHLOROTHIAZIDE 12.5 MG PO CAPS: 12.5000 mg | ORAL_CAPSULE | Freq: Every day | ORAL | Status: DC

## 2016-03-25 NOTE — Progress Notes (Signed)
Subjective:   HPI  Danielle Yoder is a 70 y.o. female who presents for a complete physical.  Medical care team includes:   Preventative care: Last ophthalmology visit:? Last dental visit:6/17 [facility] Last colonoscopy:12/22/05 Hyperplastic polyps noted. Last mammogram:06/16/15 Last gynecological exam:2008 Last ZOX:WRUEAVEKG:tilley 5 yrs ago Last labs:01/28/15  Prior vaccinations: TD or Tdap:03/13/10 Influenza:09/18/13 Pneumococcal: 23 04/30/09 01/07/14 Shingles/Zostavax:2009 Other: -  Advanced directive: Done Concerns: He continues on her blood pressure medications without difficulty. She also takes atorvastatin for her hyperlipidemia. There is also a positive family history for heart disease with a brother having an MI at age 69. She does complain of heel pain especially with walking but only on the left heel. She also complains of some arthritis type symptoms but mainly in the left index finger MCP joint. Her allergies give her very little difficulty. She has not had a DEXA scan. Reviewed their medical, surgical, family, social, medication, and allergy history and updated chart as appropriate.  Review of Systems Constitutional: -fever, -chills, -sweats, -unexpected weight change, -decreased appetite, -fatigue Allergy: -sneezing, -itching, -congestion Dermatology: -changing moles, --rash, -lumps ENT: -runny nose, -ear pain, -sore throat, -hoarseness, -sinus pain, -teeth pain, - ringing in ears, -hearing loss, -nosebleeds Cardiology: -chest pain, -palpitations, -swelling, -difficulty breathing when lying flat, -waking up short of breath Respiratory: -cough, -shortness of breath, -difficulty breathing with exercise or exertion, -wheezing, -coughing up blood Gastroenterology: -abdominal pain, -nausea, -vomiting, -diarrhea, -constipation, -blood in stool, -changes in bowel movement, -difficulty swallowing or eating Hematology: -bleeding, -bruising  Musculoskeletal: -joint aches, -muscle aches,  -joint swelling, -back pain, -neck pain, -cramping, -changes in gait Ophthalmology: denies vision changes, eye redness, itching, discharge Urology: -burning with urination, -difficulty urinating, -blood in urine, -urinary frequency, -urgency, -incontinence Neurology: -headache, -weakness, -tingling, -numbness, -memory loss, -falls, -dizziness Psychology: -depressed mood, -agitation, -sleep problems     Objective:   Physical Exam   General appearance: alert, no distress, WD/WN,  Skin:  HEENT: normocephalic, conjunctiva/corneas normal, sclerae anicteric, PERRLA, EOMi, nares patent, no discharge or erythema, pharynx normal Oral cavity: MMM, tongue normal, teeth normal Neck: supple, no lymphadenopathy, no thyromegaly, no masses, normal ROM Chest: non tender, normal shape and expansion Heart: RRR, normal S1, S2, no murmurs Lungs: CTA bilaterally, no wheezes, rhonchi, or rales Abdomen: +bs, soft, non tender, non distended, no masses, no hepatomegaly, no splenomegaly, no bruits Musculoskeletal: upper extremities non tender, no obvious deformity, normal ROM throughout, lower extremities non tender, no obvious deformity, normal ROM throughout Extremities: no edema, no cyanosis, no clubbing Pulses: 2+ symmetric, upper and lower extremities, normal cap refill Neurological: alert, oriented x 3, CN2-12 intact, strength normal upper extremities and lower extremities, sensation normal throughout, DTRs 2+ throughout, no cerebellar signs, gait normal Psychiatric: normal affect, behavior normal, pleasant     Assessment and Plan :   Essential hypertension - Plan: hydrochlorothiazide (MICROZIDE) 12.5 MG capsule, verapamil (CALAN-SR) 240 MG CR tablet, CBC with Differential/Platelet, Comprehensive metabolic panel  Arthritis  Hyperlipidemia with target LDL less than 100 - Plan: atorvastatin (LIPITOR) 40 MG tablet, Lipid panel  Non-seasonal allergic rhinitis due to pollen  Family history of heart  disease in female family member before age 69 - Plan: CBC with Differential/Platelet, Comprehensive metabolic panel, Lipid panel  Need for prophylactic vaccination against Streptococcus pneumoniae (pneumococcus) - Plan: Pneumococcal conjugate vaccine 13-valent  High risk for fracture due to osteoporosis by DEXA scan - Plan: DG Bone Density  Screening for colon cancer - Plan: Cologuard  Need for hepatitis C screening test -  Plan: Hepatitis C antibody  Heel pain, chronic, left Recommend Tylenol for her arthritis. Also recommend bilateral heel cups to help with her heel pain. Tylenol for pain relief. She will continue on her present medication regimen. I will set her up for Cologuard since she had hyperplastic polyps. Will also get a DEXA scan on her. Otherwise encouraged her to continue to take good care of herself.  Physical exam - discussed healthy lifestyle, diet, exercise, preventative care, vaccinations, and addressed their concerns.  Handout given. Follow-up one year.

## 2016-03-25 NOTE — Patient Instructions (Signed)
  Ms. Catalina AntiguaBattle , Thank you for taking time to come for your Medicare Wellness Visit. I appreciate your ongoing commitment to your health goals. Please review the following plan we discussed and let me know if I can assist you in the future.   These are the goals we discussed: Goals    None      This is a list of the screening recommended for you and due dates:  Health Maintenance  Topic Date Due  .  Hepatitis C: One time screening is recommended by Center for Disease Control  (CDC) for  adults born from [birth year] through 1964.   09-29-1945  . DEXA scan (bone density measurement)  12/20/2011  . Colon Cancer Screening  12/23/2015  . Flu Shot  04/13/2016  . Mammogram  06/15/2017  . Tetanus Vaccine  03/13/2020  . Shingles Vaccine  Completed  . Pneumonia vaccines  Completed

## 2016-03-26 LAB — COMPREHENSIVE METABOLIC PANEL
ALBUMIN: 4.2 g/dL (ref 3.6–5.1)
ALK PHOS: 58 U/L (ref 33–130)
ALT: 16 U/L (ref 6–29)
AST: 18 U/L (ref 10–35)
BILIRUBIN TOTAL: 0.5 mg/dL (ref 0.2–1.2)
BUN: 17 mg/dL (ref 7–25)
CALCIUM: 10.7 mg/dL — AB (ref 8.6–10.4)
CO2: 23 mmol/L (ref 20–31)
Chloride: 106 mmol/L (ref 98–110)
Creat: 0.84 mg/dL (ref 0.50–0.99)
Glucose, Bld: 88 mg/dL (ref 65–99)
POTASSIUM: 3.9 mmol/L (ref 3.5–5.3)
Sodium: 138 mmol/L (ref 135–146)
Total Protein: 6.8 g/dL (ref 6.1–8.1)

## 2016-03-26 LAB — LIPID PANEL
Cholesterol: 159 mg/dL (ref 125–200)
HDL: 49 mg/dL (ref >=46)
LDL Cholesterol: 89 mg/dL (ref <130)
TRIGLYCERIDES: 107 mg/dL (ref <150)
Total CHOL/HDL Ratio: 3.2 Ratio (ref <=5.0)
VLDL: 21 mg/dL (ref <30)

## 2016-03-26 LAB — HEPATITIS C ANTIBODY: HCV AB: NEGATIVE

## 2016-04-22 ENCOUNTER — Telehealth: Payer: Self-pay | Admitting: Family Medicine

## 2016-04-22 NOTE — Telephone Encounter (Signed)
Pt called stating that she has not received the Cologuard info and no one has ever contacted her about this at all

## 2016-04-22 NOTE — Telephone Encounter (Signed)
Called pt back to let her know I will resend the request pt verbalized understanding

## 2016-04-29 LAB — COLOGUARD: Cologuard: NEGATIVE

## 2016-05-07 ENCOUNTER — Telehealth: Payer: Self-pay

## 2016-05-07 NOTE — Telephone Encounter (Signed)
Pt states that she has done the cologuard and returned it to them. She got an automated call yesterday saying that she is due for a colonoscopy. Does she still need colonscopy with cologuard?   Thank you, RLB

## 2016-05-08 NOTE — Telephone Encounter (Signed)
Let her know that as soon as we get the results back on the Cologuard we will let her know concerning colonoscopy

## 2016-05-10 NOTE — Telephone Encounter (Signed)
Left message on home machine word for word

## 2016-05-12 ENCOUNTER — Encounter: Payer: Self-pay | Admitting: Family Medicine

## 2016-07-05 ENCOUNTER — Other Ambulatory Visit: Payer: Self-pay | Admitting: Family Medicine

## 2016-07-05 DIAGNOSIS — Z1231 Encounter for screening mammogram for malignant neoplasm of breast: Secondary | ICD-10-CM

## 2016-08-10 ENCOUNTER — Ambulatory Visit
Admission: RE | Admit: 2016-08-10 | Discharge: 2016-08-10 | Disposition: A | Discharge status: Home / Self Care | Payer: Medicare Other | Source: Ambulatory Visit | Attending: Family Medicine | Admitting: Family Medicine

## 2016-08-10 DIAGNOSIS — Z1231 Encounter for screening mammogram for malignant neoplasm of breast: Secondary | ICD-10-CM

## 2017-04-19 ENCOUNTER — Telehealth: Payer: Self-pay | Admitting: Family Medicine

## 2017-04-19 NOTE — Telephone Encounter (Signed)
Pt states got Emmi call stating she is due for colonoscopy and she had colo-guard 8/17 & was negative and we called her and informed her.  Apologized and advised will update record.

## 2017-04-20 ENCOUNTER — Other Ambulatory Visit: Payer: Self-pay | Admitting: Family Medicine

## 2017-04-20 DIAGNOSIS — I10 Essential (primary) hypertension: Secondary | ICD-10-CM

## 2017-04-20 DIAGNOSIS — E785 Hyperlipidemia, unspecified: Secondary | ICD-10-CM

## 2017-04-20 NOTE — Telephone Encounter (Signed)
done

## 2017-04-21 ENCOUNTER — Other Ambulatory Visit: Payer: Self-pay | Admitting: Family Medicine

## 2017-04-21 DIAGNOSIS — I10 Essential (primary) hypertension: Secondary | ICD-10-CM

## 2017-04-21 DIAGNOSIS — E785 Hyperlipidemia, unspecified: Secondary | ICD-10-CM

## 2017-05-17 ENCOUNTER — Encounter: Payer: Self-pay | Admitting: Family Medicine

## 2017-05-17 ENCOUNTER — Ambulatory Visit (INDEPENDENT_AMBULATORY_CARE_PROVIDER_SITE_OTHER): Payer: Medicare Other | Admitting: Family Medicine

## 2017-05-17 VITALS — BP 130/70 | HR 56 | Resp 16 | Wt 137.4 lb

## 2017-05-17 DIAGNOSIS — Z23 Encounter for immunization: Secondary | ICD-10-CM

## 2017-05-17 DIAGNOSIS — L299 Pruritus, unspecified: Secondary | ICD-10-CM | POA: Diagnosis not present

## 2017-05-17 NOTE — Progress Notes (Signed)
   Subjective:    Patient ID: Danielle Yoder, female    DOB: 12/30/1945, 71 y.o.   MRN: [id number]  HPI She complains of itching sensation in and around her right ear but no redness, swelling, vesicles, earache, sore throat   Review of Systems     Objective:   Physical Exam Alert and in no distress. Exam of the external ear shows no lesions. There is no postauricular problems. TMs and canals normal.       Assessment & Plan:  Pruritus  Need for influenza vaccination - Plan: Flu vaccine HIGH DOSE PF (Fluzone High dose)   I explained that I did not find anything of significance. Recommend supportive care and if continued difficulty, refer to ENT.

## 2017-07-19 ENCOUNTER — Other Ambulatory Visit: Payer: Self-pay | Admitting: Family Medicine

## 2017-07-19 DIAGNOSIS — Z1231 Encounter for screening mammogram for malignant neoplasm of breast: Secondary | ICD-10-CM

## 2017-07-22 ENCOUNTER — Other Ambulatory Visit: Payer: Self-pay | Admitting: Family Medicine

## 2017-07-22 DIAGNOSIS — E785 Hyperlipidemia, unspecified: Secondary | ICD-10-CM

## 2017-07-22 DIAGNOSIS — I10 Essential (primary) hypertension: Secondary | ICD-10-CM

## 2017-08-17 ENCOUNTER — Ambulatory Visit
Admission: RE | Admit: 2017-08-17 | Discharge: 2017-08-17 | Disposition: A | Discharge status: Home / Self Care | Payer: Medicare Other | Source: Ambulatory Visit | Attending: Family Medicine | Admitting: Family Medicine

## 2017-08-17 DIAGNOSIS — Z1231 Encounter for screening mammogram for malignant neoplasm of breast: Secondary | ICD-10-CM

## 2017-10-24 ENCOUNTER — Other Ambulatory Visit: Payer: Self-pay | Admitting: Family Medicine

## 2017-10-24 DIAGNOSIS — I10 Essential (primary) hypertension: Secondary | ICD-10-CM

## 2017-10-24 DIAGNOSIS — E785 Hyperlipidemia, unspecified: Secondary | ICD-10-CM

## 2017-10-27 ENCOUNTER — Other Ambulatory Visit: Payer: Self-pay | Admitting: Family Medicine

## 2017-10-27 DIAGNOSIS — I10 Essential (primary) hypertension: Secondary | ICD-10-CM

## 2017-10-27 DIAGNOSIS — E785 Hyperlipidemia, unspecified: Secondary | ICD-10-CM

## 2017-10-27 NOTE — Telephone Encounter (Signed)
[facility] Monday [facility]

## 2017-12-02 ENCOUNTER — Telehealth: Payer: Self-pay | Admitting: Family Medicine

## 2017-12-02 NOTE — Telephone Encounter (Signed)
Probably okay for her to stop the aspirin

## 2017-12-02 NOTE — Telephone Encounter (Signed)
Please advise if new studies on aspirin would apply to this pt. Thanks [facility]

## 2017-12-02 NOTE — Telephone Encounter (Signed)
New Message  Pt c/o medication issue:  1. Name of Medication:  Aspirin 81 mg tablet once daily  2. How are you currently taking this medication (dosage and times per day)?  See above  3. Are you having a reaction (difficulty breathing--STAT)?  N/A  4. What is your medication issue?  Pt verbalized she is wanting to speak to someone in regard this medication and recent studies.  Pt verbalized she includes Aspirin as her daily regimen and would like to speak to nurse.  Please f/u

## 2017-12-02 NOTE — Telephone Encounter (Signed)
PT WAS MADE AWARE. KH

## 2017-12-13 ENCOUNTER — Encounter: Payer: Self-pay | Admitting: Family Medicine

## 2017-12-13 ENCOUNTER — Ambulatory Visit: Payer: Medicare Other | Admitting: Family Medicine

## 2017-12-13 VITALS — BP 118/76 | HR 60 | Temp 97.8°F | Ht 60.0 in | Wt 139.6 lb

## 2017-12-13 DIAGNOSIS — Z860100 Personal history of colon polyps, unspecified: Secondary | ICD-10-CM

## 2017-12-13 DIAGNOSIS — Z8249 Family history of ischemic heart disease and other diseases of the circulatory system: Secondary | ICD-10-CM

## 2017-12-13 DIAGNOSIS — E2839 Other primary ovarian failure: Secondary | ICD-10-CM

## 2017-12-13 DIAGNOSIS — M1990 Unspecified osteoarthritis, unspecified site: Secondary | ICD-10-CM | POA: Diagnosis not present

## 2017-12-13 DIAGNOSIS — E785 Hyperlipidemia, unspecified: Secondary | ICD-10-CM | POA: Diagnosis not present

## 2017-12-13 DIAGNOSIS — I10 Essential (primary) hypertension: Secondary | ICD-10-CM

## 2017-12-13 DIAGNOSIS — J301 Allergic rhinitis due to pollen: Secondary | ICD-10-CM | POA: Diagnosis not present

## 2017-12-13 MED ORDER — HYDROCHLOROTHIAZIDE 12.5 MG PO CAPS: 12.5000 mg | ORAL_CAPSULE | Freq: Every day | ORAL | 3 refills | Status: DC

## 2017-12-13 MED ORDER — ATORVASTATIN CALCIUM 40 MG PO TABS: 40.0000 mg | ORAL_TABLET | Freq: Every day | ORAL | 3 refills | Status: DC

## 2017-12-13 MED ORDER — VERAPAMIL HCL ER 240 MG PO TBCR: 240.0000 mg | EXTENDED_RELEASE_TABLET | Freq: Every day | ORAL | 3 refills | Status: DC

## 2017-12-13 NOTE — Progress Notes (Signed)
Danielle Yoder is a 72 y.o. female who presents for annual wellness visit and follow-up on chronic medical conditions.  She has the following concerns: She has no particular concerns or complaints.  She continues on atorvastatin as well as her blood pressure medications and is having no difficulty with them.  She is also taking calcium and vitamin D.  Her allergies are under good control.  She does have occasional difficulty with arthritis but is not interfering with her ADLs.  She had a recent Cologuard.  Immunizations and Health Maintenance Immunization History  Administered Date(s) Administered  . Influenza Split 05/31/2011, 09/26/2012  . Influenza, High Dose Seasonal PF 09/18/2013, 05/17/2017  . Influenza-Unspecified 09/24/2016  . Pneumococcal Conjugate-13 03/25/2016  . Pneumococcal Polysaccharide-23 04/30/2009, 01/07/2014  . Tdap 03/13/2010  . Zoster 05/01/2008   Health Maintenance Due  Topic Date Due  . DEXA SCAN  12/20/2011    Last Pap smear: years ago Last mammogram: nov 2018 Last colonoscopy: 15 years ago, Cologuard in 2017 Last DEXA: never.ordered today  Symmetric text dentist: march 2019 Ophtho: 2018 end of year Exercise: [employer] silver sneaker 3 days a week  Other doctors caring for patient include:Danielle Yoder  Advanced directives: No.  Copy requested. Does Patient Have a Medical Advance Directive?: Yes Type of Advance Directive: Healthcare Power of Attorney, Living will Does patient want to make changes to medical advance directive?: No - Patient declined  Depression screen:  See questionnaire below.  Depression screen Danielle Yoder 2/9 12/13/2017 03/25/2016  Decreased Interest 0 0  Down, Depressed, Hopeless 0 0  PHQ - 2 Score 0 0    Fall Risk Screen: see questionnaire below. Fall Risk  12/13/2017 03/25/2016  Falls in the past year? No No    ADL screen:  See questionnaire below Functional Status Survey: Is the patient deaf or have difficulty hearing?: No Does the patient have  difficulty seeing, even when wearing glasses/contacts?: No Does the patient have difficulty concentrating, remembering, or making decisions?: No Does the patient have difficulty walking or climbing stairs?: No Does the patient have difficulty dressing or bathing?: No Does the patient have difficulty doing errands alone such as visiting a doctor's office or shopping?: No   Review of Systems Constitutional: -, -unexpected weight change, -anorexia, -fatigue Allergy: -sneezing, -itching, -congestion Dermatology: denies changing moles, rash, lumps ENT: -runny nose, -ear pain, -sore throat,  Cardiology:  -chest pain, -palpitations, -orthopnea, Respiratory: -cough, -shortness of breath, -dyspnea on exertion, -wheezing,  Gastroenterology: -abdominal pain, -nausea, -vomiting, -diarrhea, -constipation, -dysphagia Hematology: -bleeding or bruising problems Musculoskeletal: -arthralgias, -myalgias, -joint swelling, -back pain, - Ophthalmology: -vision changes,  Urology: -dysuria, -difficulty urinating,  -urinary frequency, -urgency, incontinence Neurology: -, -numbness, , -memory loss, -falls, -dizziness    PHYSICAL EXAM:  General Appearance: Alert, cooperative, no distress, appears stated age Head: Normocephalic, without obvious abnormality, atraumatic Eyes: PERRL, conjunctiva/corneas clear, EOM's intact, fundi benign Ears: Normal TM's and external ear canals Nose: Nares normal, mucosa normal, no drainage or sinus tenderness Throat: Lips, mucosa, and tongue normal; teeth and gums normal Neck: Supple, no lymphadenopathy;  thyroid:  no enlargement/tenderness/nodules; no carotid bruit or JVD Lungs: Clear to auscultation bilaterally without wheezes, rales or ronchi; respirations unlabored Heart: Regular rate and rhythm, S1 and S2 normal, no murmur, rubor gallop Abdomen: Soft, non-tender, nondistended, normoactive bowel sounds,  no masses, no hepatosplenomegaly Extremities: No clubbing, cyanosis  or edema Pulses: 2+ and symmetric all extremities Skin:  Skin color, texture, turgor normal, no rashes or lesions Lymph nodes: Cervical, supraclavicular, and  axillary nodes normal Neurologic:  CNII-XII intact, normal strength, sensation and gait; reflexes 2+ and symmetric throughout Psych: Normal mood, affect, hygiene and grooming.  ASSESSMENT/PLAN: Non-seasonal allergic rhinitis due to pollen  Arthritis  History of colonic polyps  Hyperlipidemia with target LDL less than 100 - Plan: Lipid panel, atorvastatin (LIPITOR) 40 MG tablet  Family history of heart disease in female family member before age 18 - Plan: CBC with Differential/Platelet, Comprehensive metabolic panel, Lipid panel  Essential hypertension - Plan: CBC with Differential/Platelet, Comprehensive metabolic panel, hydrochlorothiazide (MICROZIDE) 12.5 MG capsule, verapamil (CALAN-SR) 240 MG CR tablet  Estrogen deficiency - Plan: DG Bone Density Overall she is taking excellent care of herself.  I will continue her on her present medication regimen.  Encouraged her to stay physically active.      Medicare Attestation I have personally reviewed: The patient's medical and social history Their use of alcohol, tobacco or illicit drugs Their current medications and supplements The patient's functional ability including ADLs,fall risks, home safety risks, cognitive, and hearing and visual impairment Diet and physical activities Evidence for depression or mood disorders  The patient's weight, height, and BMI have been recorded in the chart.  I have made referrals, counseling, and provided education to the patient based on review of the above and I have provided the patient with a written personalized care plan for preventive services.     Danielle Gowda, MD   12/13/2017

## 2017-12-13 NOTE — Patient Instructions (Signed)
  Ms. Danielle Yoder , Thank you for taking time to come for your Medicare Wellness Visit. I appreciate your ongoing commitment to your health goals. Please review the following plan we discussed and let me know if I can assist you in the future.   These are the goals we discussed: Goals    None      This is a list of the screening recommended for you and due dates:  Health Maintenance  Topic Date Due  . DEXA scan (bone density measurement)  12/20/2011  . Flu Shot  04/13/2018  . Cologuard (Stool DNA test)  04/30/2019  . Mammogram  08/18/2019  . Tetanus Vaccine  03/13/2020  .  Hepatitis C: One time screening is recommended by Center for Disease Control  (CDC) for  adults born from [id number] through 1965.   Completed  . Pneumonia vaccines  Completed

## 2017-12-14 LAB — CBC WITH DIFFERENTIAL/PLATELET
BASOS: 0 %
Basophils Absolute: 0 10*3/uL (ref 0.0–0.2)
EOS (ABSOLUTE): 0.6 10*3/uL — ABNORMAL HIGH (ref 0.0–0.4)
EOS: 9 %
HEMATOCRIT: 41.7 % (ref 34.0–46.6)
HEMOGLOBIN: 13.1 g/dL (ref 11.1–15.9)
Immature Grans (Abs): 0 10*3/uL (ref 0.0–0.1)
Immature Granulocytes: 0 %
LYMPHS ABS: 2.2 10*3/uL (ref 0.7–3.1)
Lymphs: 32 %
MCH: 26.7 pg (ref 26.6–33.0)
MCHC: 31.4 g/dL — AB (ref 31.5–35.7)
MCV: 85 fL (ref 79–97)
Monocytes Absolute: 0.7 10*3/uL (ref 0.1–0.9)
Monocytes: 11 %
NEUTROS ABS: 3.2 10*3/uL (ref 1.4–7.0)
Neutrophils: 48 %
Platelets: 243 10*3/uL (ref 150–379)
RBC: 4.9 x10E6/uL (ref 3.77–5.28)
RDW: 14.2 % (ref 12.3–15.4)
WBC: 6.7 10*3/uL (ref 3.4–10.8)

## 2017-12-14 LAB — COMPREHENSIVE METABOLIC PANEL
A/G RATIO: 1.5 (ref 1.2–2.2)
ALBUMIN: 4.2 g/dL (ref 3.5–4.8)
ALT: 21 IU/L (ref 0–32)
AST: 18 IU/L (ref 0–40)
Alkaline Phosphatase: 71 IU/L (ref 39–117)
BUN / CREAT RATIO: 29 — AB (ref 12–28)
BUN: 24 mg/dL (ref 8–27)
Bilirubin Total: 0.4 mg/dL (ref 0.0–1.2)
CO2: 25 mmol/L (ref 20–29)
Calcium: 10.1 mg/dL (ref 8.7–10.3)
Chloride: 101 mmol/L (ref 96–106)
Creatinine, Ser: 0.82 mg/dL (ref 0.57–1.00)
GFR calc Af Amer: 84 mL/min/{1.73_m2} (ref >59)
GFR calc non Af Amer: 73 mL/min/{1.73_m2} (ref >59)
GLOBULIN, TOTAL: 2.8 g/dL (ref 1.5–4.5)
Glucose: 83 mg/dL (ref 65–99)
POTASSIUM: 4.2 mmol/L (ref 3.5–5.2)
SODIUM: 142 mmol/L (ref 134–144)
Total Protein: 7 g/dL (ref 6.0–8.5)

## 2017-12-14 LAB — LIPID PANEL
CHOL/HDL RATIO: 3.7 ratio (ref 0.0–4.4)
CHOLESTEROL TOTAL: 160 mg/dL (ref 100–199)
HDL: 43 mg/dL (ref >39)
LDL CALC: 92 mg/dL (ref 0–99)
Triglycerides: 125 mg/dL (ref 0–149)
VLDL Cholesterol Cal: 25 mg/dL (ref 5–40)

## 2018-01-11 ENCOUNTER — Inpatient Hospital Stay: Admission: RE | Admit: 2018-01-11 | Payer: Medicare Other | Source: Ambulatory Visit

## 2018-02-17 ENCOUNTER — Ambulatory Visit
Admission: RE | Admit: 2018-02-17 | Discharge: 2018-02-17 | Disposition: A | Discharge status: Home / Self Care | Payer: Medicare Other | Source: Ambulatory Visit | Attending: Family Medicine | Admitting: Family Medicine

## 2018-02-17 DIAGNOSIS — M8580 Other specified disorders of bone density and structure, unspecified site: Secondary | ICD-10-CM | POA: Insufficient documentation

## 2018-03-29 ENCOUNTER — Encounter: Payer: Self-pay | Admitting: *Deleted

## 2018-07-13 ENCOUNTER — Ambulatory Visit: Payer: Medicare Other | Admitting: Family Medicine

## 2018-07-13 ENCOUNTER — Encounter: Payer: Self-pay | Admitting: Family Medicine

## 2018-07-13 VITALS — BP 138/86 | HR 68 | Temp 98.0°F | Wt 139.2 lb

## 2018-07-13 DIAGNOSIS — R6889 Other general symptoms and signs: Secondary | ICD-10-CM | POA: Diagnosis not present

## 2018-07-13 NOTE — Progress Notes (Signed)
   Subjective:    Patient ID: Danielle Yoder, female    DOB: [birth year], 72 y.o.   MRN: [id number]  HPI She is here for a consult concerning her recent home evaluation from her insurance company.  It apparently did show possible circulation issue in her extremities.  The circulation test stated 0.85 on the right and 0.5 on the left.  She does not have any leg pains.  No difficulty with physical activity.  Sleep is normal.   Review of Systems     Objective:   Physical Exam Alert and in no distress.  Pulses are quite good bilaterally.  Skin is normal.  Negative Homans sign.      Assessment & Plan:  Abnormal ankle brachial index (ABI) - Plan: VAS [place] ABI WITH/WO TBI I will get a more formal ABI and see what this shows.

## 2018-07-18 ENCOUNTER — Other Ambulatory Visit: Payer: Self-pay | Admitting: Family Medicine

## 2018-07-18 DIAGNOSIS — Z1231 Encounter for screening mammogram for malignant neoplasm of breast: Secondary | ICD-10-CM

## 2018-07-19 ENCOUNTER — Ambulatory Visit (HOSPITAL_COMMUNITY)
Admission: RE | Admit: 2018-07-19 | Discharge: 2018-07-19 | Disposition: A | Discharge status: Home / Self Care | Payer: Medicare Other | Source: Ambulatory Visit | Attending: Cardiovascular Disease | Admitting: Cardiovascular Disease

## 2018-07-19 DIAGNOSIS — R6889 Other general symptoms and signs: Secondary | ICD-10-CM | POA: Diagnosis present

## 2018-08-29 ENCOUNTER — Ambulatory Visit
Admission: RE | Admit: 2018-08-29 | Discharge: 2018-08-29 | Disposition: A | Discharge status: Home / Self Care | Payer: Medicare Other | Source: Ambulatory Visit | Attending: Family Medicine | Admitting: Family Medicine

## 2018-08-29 DIAGNOSIS — Z1231 Encounter for screening mammogram for malignant neoplasm of breast: Secondary | ICD-10-CM

## 2019-01-24 ENCOUNTER — Other Ambulatory Visit: Payer: Self-pay | Admitting: Family Medicine

## 2019-01-24 DIAGNOSIS — I10 Essential (primary) hypertension: Secondary | ICD-10-CM

## 2019-01-24 DIAGNOSIS — E785 Hyperlipidemia, unspecified: Secondary | ICD-10-CM

## 2019-01-29 ENCOUNTER — Ambulatory Visit: Payer: Medicare Other | Admitting: Family Medicine

## 2019-01-30 ENCOUNTER — Encounter: Payer: Self-pay | Admitting: Family Medicine

## 2019-01-30 ENCOUNTER — Other Ambulatory Visit: Payer: Self-pay

## 2019-01-30 ENCOUNTER — Ambulatory Visit: Payer: Medicare Other | Admitting: Family Medicine

## 2019-01-30 VITALS — BP 132/80 | HR 63 | Temp 97.7°F | Ht 60.0 in | Wt 125.0 lb

## 2019-01-30 DIAGNOSIS — Z8249 Family history of ischemic heart disease and other diseases of the circulatory system: Secondary | ICD-10-CM

## 2019-01-30 DIAGNOSIS — M1990 Unspecified osteoarthritis, unspecified site: Secondary | ICD-10-CM

## 2019-01-30 DIAGNOSIS — J301 Allergic rhinitis due to pollen: Secondary | ICD-10-CM

## 2019-01-30 DIAGNOSIS — Z1211 Encounter for screening for malignant neoplasm of colon: Secondary | ICD-10-CM | POA: Diagnosis not present

## 2019-01-30 DIAGNOSIS — I10 Essential (primary) hypertension: Secondary | ICD-10-CM

## 2019-01-30 DIAGNOSIS — M8580 Other specified disorders of bone density and structure, unspecified site: Secondary | ICD-10-CM

## 2019-01-30 DIAGNOSIS — E785 Hyperlipidemia, unspecified: Secondary | ICD-10-CM

## 2019-01-30 LAB — COMPREHENSIVE METABOLIC PANEL
ALT: 22 IU/L (ref 0–32)
AST: 25 IU/L (ref 0–40)
Albumin/Globulin Ratio: 1.7 (ref 1.2–2.2)
Albumin: 4.3 g/dL (ref 3.7–4.7)
Alkaline Phosphatase: 57 IU/L (ref 39–117)
BUN/Creatinine Ratio: 24 (ref 12–28)
BUN: 22 mg/dL (ref 8–27)
Bilirubin Total: 0.4 mg/dL (ref 0.0–1.2)
CO2: 25 mmol/L (ref 20–29)
Calcium: 11.1 mg/dL — ABNORMAL HIGH (ref 8.7–10.3)
Chloride: 106 mmol/L (ref 96–106)
Creatinine, Ser: 0.9 mg/dL (ref 0.57–1.00)
GFR calc Af Amer: 74 mL/min/{1.73_m2} (ref >59)
GFR calc non Af Amer: 64 mL/min/{1.73_m2} (ref >59)
Globulin, Total: 2.6 g/dL (ref 1.5–4.5)
Glucose: 82 mg/dL (ref 65–99)
Potassium: 4.9 mmol/L (ref 3.5–5.2)
Sodium: 145 mmol/L — ABNORMAL HIGH (ref 134–144)
Total Protein: 6.9 g/dL (ref 6.0–8.5)

## 2019-01-30 LAB — CBC WITH DIFFERENTIAL/PLATELET
Basophils Absolute: 0.1 10*3/uL (ref 0.0–0.2)
Basos: 1 %
EOS (ABSOLUTE): 0.5 10*3/uL — ABNORMAL HIGH (ref 0.0–0.4)
Eos: 8 %
Hematocrit: 39.1 % (ref 34.0–46.6)
Hemoglobin: 12.7 g/dL (ref 11.1–15.9)
Immature Grans (Abs): 0 10*3/uL (ref 0.0–0.1)
Immature Granulocytes: 0 %
Lymphocytes Absolute: 2.1 10*3/uL (ref 0.7–3.1)
Lymphs: 35 %
MCH: 27.1 pg (ref 26.6–33.0)
MCHC: 32.5 g/dL (ref 31.5–35.7)
MCV: 83 fL (ref 79–97)
Monocytes Absolute: 0.7 10*3/uL (ref 0.1–0.9)
Monocytes: 11 %
Neutrophils Absolute: 2.7 10*3/uL (ref 1.4–7.0)
Neutrophils: 45 %
Platelets: 238 10*3/uL (ref 150–450)
RBC: 4.69 x10E6/uL (ref 3.77–5.28)
RDW: 13.1 % (ref 11.7–15.4)
WBC: 6 10*3/uL (ref 3.4–10.8)

## 2019-01-30 LAB — LIPID PANEL
Chol/HDL Ratio: 3.5 ratio (ref 0.0–4.4)
Cholesterol, Total: 143 mg/dL (ref 100–199)
HDL: 41 mg/dL (ref >39)
LDL Calculated: 84 mg/dL (ref 0–99)
Triglycerides: 92 mg/dL (ref 0–149)
VLDL Cholesterol Cal: 18 mg/dL (ref 5–40)

## 2019-01-30 MED ORDER — VERAPAMIL HCL ER 240 MG PO TBCR: 240.0000 mg | EXTENDED_RELEASE_TABLET | Freq: Every day | ORAL | 3 refills | Status: DC

## 2019-01-30 MED ORDER — ATORVASTATIN CALCIUM 40 MG PO TABS: 40.0000 mg | ORAL_TABLET | Freq: Every day | ORAL | 3 refills | Status: DC

## 2019-01-30 MED ORDER — HYDROCHLOROTHIAZIDE 12.5 MG PO CAPS: 12.5000 mg | ORAL_CAPSULE | Freq: Every day | ORAL | 3 refills | Status: DC

## 2019-01-30 NOTE — Progress Notes (Signed)
Danielle Yoder is a 73 y.o. female who presents for annual wellness visit and follow-up on chronic medical conditions.  She has had some minor difficulty when she walks of getting some allergic type symptoms.  Otherwise she seems to be doing well.  She continues on medications listed in the chart and they were reviewed with her.  Her marriage and home life are going quite well.  Family and social history was reviewed.  She has made some dietary and lifestyle adjustments and is very happy with the weight loss. Immunizations and Health Maintenance Immunization History  Administered Date(s) Administered  . Influenza Split 05/31/2011, 09/26/2012  . Influenza, High Dose Seasonal PF 09/18/2013, 05/17/2017  . Influenza-Unspecified 09/24/2016  . Pneumococcal Conjugate-13 03/25/2016  . Pneumococcal Polysaccharide-23 04/30/2009, 01/07/2014  . Tdap 03/13/2010  . Zoster 05/01/2008   There are no preventive care reminders to display for this patient.  Last Pap smear: aged out  Last mammogram:08-29-18 Last colonoscopy:04/29/16 [facility] Last DEXA:02/17/2018 Dentist: dec 2019 Ophtho: over two years Exercise: [employer], walking qd  Other doctors caring for patient include: Dr. Duke Salvia  Cardiology  Advanced directives: Yes.  Copy asked for.  Depression screen:  See questionnaire below.  Depression screen [facility] 2/9 01/30/2019 12/13/2017 03/25/2016  Decreased Interest 0 0 0  Down, Depressed, Hopeless 0 0 0  PHQ - 2 Score 0 0 0    Fall Risk Screen: see questionnaire below. Fall Risk  01/30/2019 12/13/2017 03/25/2016  Falls in the past year? 0 No No    ADL screen:  See questionnaire below Functional Status Survey: Is the patient deaf or have difficulty hearing?: No Does the patient have difficulty seeing, even when wearing glasses/contacts?: No Does the patient have difficulty concentrating, remembering, or making decisions?: No Does the patient have difficulty walking or climbing stairs?: No Does the  patient have difficulty dressing or bathing?: No Does the patient have difficulty doing errands alone such as visiting a doctor's office or shopping?: No   Review of Systems Constitutional: -, -unexpected weight change, -anorexia, -fatigue Allergy: -sneezing, -itching, -congestion Dermatology: denies changing moles, rash, lumps ENT: -runny nose, -ear pain, -sore throat,  Cardiology:  -chest pain, -palpitations, -orthopnea, Respiratory: -cough, -shortness of breath, -dyspnea on exertion, -wheezing,  Gastroenterology: -abdominal pain, -nausea, -vomiting, -diarrhea, -constipation, -dysphagia Hematology: -bleeding or bruising problems Musculoskeletal: -arthralgias, -myalgias, -joint swelling, -back pain, - Ophthalmology: -vision changes,  Urology: -dysuria, -difficulty urinating,  -urinary frequency, -urgency, incontinence Neurology: -, -numbness, , -memory loss, -falls, -dizziness    PHYSICAL EXAM:  BP 132/80 (BP Location: Left Arm, Patient Position: Sitting)   Pulse 63   Temp 97.7 F (36.5 C)   Ht 5' (1.524 m)   Wt 125 lb (56.7 kg)   SpO2 98%   BMI 24.41 kg/m   General Appearance: Alert, cooperative, no distress, appears stated age Head: Normocephalic, without obvious abnormality, atraumatic Eyes: PERRL, conjunctiva/corneas clear, EOM's intact, fundi benign Ears: Normal TM's and external ear canals Nose: Nares normal, mucosa normal, no drainage or sinus tenderness Throat: Lips, mucosa, and tongue normal; teeth and gums normal Neck: Supple, no lymphadenopathy;  thyroid:  no enlargement/tenderness/nodules; no carotid bruit or JVD Lungs: Clear to auscultation bilaterally without wheezes, rales or ronchi; respirations unlabored Heart: Regular rate and rhythm, S1 and S2 normal, no murmur, rubor gallop Abdomen: Soft, non-tender, nondistended, normoactive bowel sounds,  no masses, no hepatosplenomegaly Extremities: No clubbing, cyanosis or edema Pulses: 2+ and symmetric all  extremities Skin:  Skin color, texture, turgor normal, no rashes or  lesions Lymph nodes: Cervical, supraclavicular, and axillary nodes normal Neurologic:  CNII-XII intact, normal strength, sensation and gait; reflexes 2+ and symmetric throughout Psych: Normal mood, affect, hygiene and grooming.  ASSESSMENT/PLAN: Essential hypertension - Plan: CBC with Differential/Platelet, Comprehensive metabolic panel, verapamil (CALAN-SR) 240 MG CR tablet, hydrochlorothiazide (MICROZIDE) 12.5 MG capsule  Hyperlipidemia with target LDL less than 100 - Plan: Lipid panel, atorvastatin (LIPITOR) 40 MG tablet  Screening for colon cancer - Plan: Cologuard  Family history of heart disease in female family member before age 72  Osteopenia, unspecified location  Arthritis  Non-seasonal allergic rhinitis due to pollen Recommended that she try Claritin when she walks to see if that will help with her allergy symptoms.  Congratulated her on her weight loss and recommend that she get rid of the close that no longer fit her.  She will continue on her present medication regimen.     at least 30 minutes of aerobic activity at least 5 days/week and weight-bearing exercise 2x/week;  healthy diet, including goals of calcium and vitamin D intake Immunization recommendations discussed.  Colonoscopy recommendations reviewed   Medicare Attestation I have personally reviewed: The patient's medical and social history Their use of alcohol, tobacco or illicit drugs Their current medications and supplements The patient's functional ability including ADLs,fall risks, home safety risks, cognitive, and hearing and visual impairment Diet and physical activities Evidence for depression or mood disorders  The patient's weight, height, and BMI have been recorded in the chart.  I have made referrals, counseling, and provided education to the patient based on review of the above and I have provided the patient with a written  personalized care plan for preventive services.     Sharlot GowdaJohn , MD   01/30/2019

## 2019-01-30 NOTE — Patient Instructions (Signed)
  Danielle Yoder , Thank you for taking time to come for your Medicare Wellness Visit. I appreciate your ongoing commitment to your health goals. Please review the following plan we discussed and let me know if I can assist you in the future.   These are the goals we discussed: Try Claritin when you go out for walks to see if that will help with your allergies.  Otherwise continue on your present medications. This is a list of the screening recommended for you and due dates:  Health Maintenance  Topic Date Due  . Flu Shot  04/14/2019  . Cologuard (Stool DNA test)  04/30/2019  . Tetanus Vaccine  03/13/2020  . Mammogram  08/29/2020  . DEXA scan (bone density measurement)  Completed  .  Hepatitis C: One time screening is recommended by Center for Disease Control  (CDC) for  adults born from [birth year] through 1964.   Completed  . Pneumonia vaccines  Completed

## 2019-05-10 LAB — COLOGUARD: Cologuard: NEGATIVE

## 2019-05-17 ENCOUNTER — Encounter: Payer: Self-pay | Admitting: *Deleted

## 2019-07-24 ENCOUNTER — Encounter: Payer: Self-pay | Admitting: Family Medicine

## 2019-07-25 ENCOUNTER — Other Ambulatory Visit: Payer: Self-pay | Admitting: Family Medicine

## 2019-07-25 DIAGNOSIS — Z1231 Encounter for screening mammogram for malignant neoplasm of breast: Secondary | ICD-10-CM

## 2019-09-20 ENCOUNTER — Other Ambulatory Visit: Payer: Self-pay

## 2019-09-20 ENCOUNTER — Ambulatory Visit
Admission: RE | Admit: 2019-09-20 | Discharge: 2019-09-20 | Disposition: A | Discharge status: Home / Self Care | Payer: Medicare Other | Source: Ambulatory Visit | Attending: Family Medicine | Admitting: Family Medicine

## 2019-09-20 DIAGNOSIS — Z1231 Encounter for screening mammogram for malignant neoplasm of breast: Secondary | ICD-10-CM | POA: Diagnosis not present

## 2019-10-25 ENCOUNTER — Ambulatory Visit: Payer: Medicare PPO

## 2020-01-23 ENCOUNTER — Other Ambulatory Visit: Payer: Self-pay | Admitting: Family Medicine

## 2020-01-23 DIAGNOSIS — E785 Hyperlipidemia, unspecified: Secondary | ICD-10-CM

## 2020-01-31 ENCOUNTER — Other Ambulatory Visit: Payer: Self-pay

## 2020-01-31 ENCOUNTER — Encounter: Payer: Self-pay | Admitting: Family Medicine

## 2020-01-31 ENCOUNTER — Ambulatory Visit: Payer: Medicare PPO | Admitting: Family Medicine

## 2020-01-31 VITALS — BP 128/82 | HR 61 | Temp 96.7°F | Ht 59.75 in | Wt 132.8 lb

## 2020-01-31 DIAGNOSIS — I10 Essential (primary) hypertension: Secondary | ICD-10-CM

## 2020-01-31 DIAGNOSIS — M8580 Other specified disorders of bone density and structure, unspecified site: Secondary | ICD-10-CM

## 2020-01-31 DIAGNOSIS — E2839 Other primary ovarian failure: Secondary | ICD-10-CM

## 2020-01-31 DIAGNOSIS — Z8249 Family history of ischemic heart disease and other diseases of the circulatory system: Secondary | ICD-10-CM

## 2020-01-31 DIAGNOSIS — J301 Allergic rhinitis due to pollen: Secondary | ICD-10-CM | POA: Diagnosis not present

## 2020-01-31 DIAGNOSIS — E785 Hyperlipidemia, unspecified: Secondary | ICD-10-CM | POA: Diagnosis not present

## 2020-01-31 DIAGNOSIS — M1990 Unspecified osteoarthritis, unspecified site: Secondary | ICD-10-CM

## 2020-01-31 DIAGNOSIS — Z0000 Encounter for general adult medical examination without abnormal findings: Secondary | ICD-10-CM

## 2020-01-31 MED ORDER — ATORVASTATIN CALCIUM 40 MG PO TABS: 40.0000 mg | ORAL_TABLET | Freq: Every day | ORAL | 3 refills | Status: DC

## 2020-01-31 MED ORDER — VERAPAMIL HCL ER 240 MG PO TBCR: 240.0000 mg | EXTENDED_RELEASE_TABLET | Freq: Every day | ORAL | 3 refills | Status: DC

## 2020-01-31 MED ORDER — HYDROCHLOROTHIAZIDE 12.5 MG PO CAPS: 12.5000 mg | ORAL_CAPSULE | Freq: Every day | ORAL | 3 refills | Status: DC

## 2020-01-31 NOTE — Progress Notes (Signed)
Danielle Yoder is a 74 y.o. female who presents for annual wellness visit,CPE and follow-up on chronic medical conditions.  She continues on atorvastatin and is having no aches or pains with that.  She does take hydrochlorothiazide and verapamil for her blood pressure and is doing well on that.  Her allergies seem to be under good control.  She does have a history of osteopenia and presently is taking vitamin D and calcium supplementation.  She has had a Cologuard test and will be scheduled for another one in 2 years.  Immunizations and Health Maintenance Immunization History  Administered Date(s) Administered  . Influenza Split 05/31/2011, 09/26/2012  . Influenza, High Dose Seasonal PF 09/18/2013, 05/17/2017  . Influenza-Unspecified 09/24/2016  . PFIZER SARS-COV-2 Vaccination 10/04/2019, 10/25/2019  . Pneumococcal Conjugate-13 03/25/2016  . Pneumococcal Polysaccharide-23 04/30/2009, 01/07/2014  . Tdap 03/13/2010  . Zoster 05/01/2008  . Zoster Recombinat (Shingrix) 01/12/2018, 04/24/2018   There are no preventive care reminders to display for this patient.  Last Pap smear: aged out  Last mammogram: 09/20/19 Last colonoscopy: 05/07/19 cologuard Last DEXA: 02/17/18 Dentist: Q six months Ophtho: over five years Exercise: Staying active , walking  Other doctors caring for patient include: Dr. Sharlett I[facility]  Advanced directives: Not on file     Depression screen:  See questionnaire below.  Depression screen [facility] 2/9 01/31/2020 01/30/2019 12/13/2017 03/25/2016  Decreased Interest 0 0 0 0  Down, Depressed, Hopeless 0 0 0 0  PHQ - 2 Score 0 0 0 0    Fall Risk Screen: see questionnaire below. Fall Risk  01/31/2020 01/30/2019 12/13/2017 03/25/2016  Falls in the past year? 0 0 No No    ADL screen:  See questionnaire below Functional Status Survey: Is the patient deaf or have difficulty hearing?: No Does the patient have difficulty seeing, even when wearing glasses/contacts?: No Does the  patient have difficulty concentrating, remembering, or making decisions?: No Does the patient have difficulty walking or climbing stairs?: No Does the patient have difficulty dressing or bathing?: No Does the patient have difficulty doing errands alone such as visiting a doctor's office or shopping?: No   Review of Systems Constitutional: -, -unexpected weight change, -anorexia, -fatigue Allergy: -sneezing, -itching, -congestion Dermatology: denies changing moles, rash, lumps ENT: -runny nose, -ear pain, -sore throat,  Cardiology:  -chest pain, -palpitations, -orthopnea, Respiratory: -cough, -shortness of breath, -dyspnea on exertion, -wheezing,  Gastroenterology: -abdominal pain, -nausea, -vomiting, -diarrhea, -constipation, -dysphagia Hematology: -bleeding or bruising problems Musculoskeletal: -arthralgias, -myalgias, -joint swelling, -back pain, - Ophthalmology: -vision changes,  Urology: -dysuria, -difficulty urinating,  -urinary frequency, -urgency, incontinence Neurology: -, -numbness, , -memory loss, -falls, -dizziness    PHYSICAL EXAM:  BP 128/82   Pulse 61   Temp (!) 96.7 F (35.9 C)   Ht 4' 11.75" (1.518 m)   Wt 132 lb 12.8 oz (60.2 kg)   SpO2 97%   BMI 26.15 kg/m   General Appearance: Alert, cooperative, no distress, appears stated age Head: Normocephalic, without obvious abnormality, atraumatic Eyes: PERRL, conjunctiva/corneas clear, EOM's intact,  Ears: Normal TM's and external ear canals Nose: Nares normal, mucosa normal, no drainage or sinus tenderness Throat: Lips, mucosa, and tongue normal; teeth and gums normal Neck: Supple, no lymphadenopathy;  thyroid:  no enlargement/tenderness/nodules; no carotid bruit or JVD Lungs: Clear to auscultation bilaterally without wheezes, rales or ronchi; respirations unlabored Heart: Regular rate and rhythm, S1 and S2 normal, no murmur, rubor gallop Abdomen: Soft, non-tender, nondistended, normoactive bowel sounds,  no  masses, no  hepatosplenomegaly Extremities: No clubbing, cyanosis or edema Pulses: 2+ and symmetric all extremities Skin:  Skin color, texture, turgor normal, no rashes or lesions Lymph nodes: Cervical, supraclavicular, and axillary nodes normal Neurologic:  CNII-XII intact, normal strength, sensation and gait; reflexes 2+ and symmetric throughout Psych: Normal mood, affect, hygiene and grooming.  ASSESSMENT/PLAN: Osteopenia, unspecified location - Plan: DG Bone Density  Essential hypertension - Plan: Comprehensive metabolic panel, CBC with Differential/Platelet  Hyperlipidemia with target LDL less than 100 - Plan: Lipid panel  Family history of heart disease in female family member before age 56 - Plan: Comprehensive metabolic panel, CBC with Differential/Platelet, Lipid panel  Arthritis  Non-seasonal allergic rhinitis due to pollen  Estrogen deficiency - Plan: DG Bone Density . Recommend she follow-up with getting an ophthalmology evaluation continue on her present medications.  Encouraged her to remain physically active.  We then talked about her retirement and keeping herself intellectually, psychologically and socially engaged.   healthy diet, including goals of calcium and vitamin D intake and alcohol recommendations (less than or equal to 1 drink/day) reviewed; regular seatbelt use; changing batteries in smoke detectors.  Immunization recommendations discussed.  Colonoscopy recommendations reviewed   Medicare Attestation I have personally reviewed: The patient's medical and social history Their use of alcohol, tobacco or illicit drugs Their current medications and supplements The patient's functional ability including ADLs,fall risks, home safety risks, cognitive, and hearing and visual impairment Diet and physical activities Evidence for depression or mood disorders  The patient's weight, height, and BMI have been recorded in the chart.  I have made referrals, counseling,  and provided education to the patient based on review of the above and I have provided the patient with a written personalized care plan for preventive services.     Sharlot Gowda, MD   01/31/2020

## 2020-01-31 NOTE — Patient Instructions (Signed)
  Danielle Yoder , Thank you for taking time to come for your Medicare Wellness Visit. I appreciate your ongoing commitment to your health goals. Please review the following plan we discussed and let me know if I can assist you in the future.   These are the goals we discussed: Goals   None     This is a list of the screening recommended for you and due dates:  Health Maintenance  Topic Date Due  . Tetanus Vaccine  03/13/2020  . Flu Shot  04/13/2020  . Mammogram  09/19/2021  . Cologuard (Stool DNA test)  05/09/2022  . DEXA scan (bone density measurement)  Completed  . COVID-19 Vaccine  Completed  .  Hepatitis C: One time screening is recommended by Center for Disease Control  (CDC) for  adults born from [birth year] through 1964.   Completed  . Pneumonia vaccines  Completed

## 2020-02-01 LAB — COMPREHENSIVE METABOLIC PANEL
ALT: 26 IU/L (ref 0–32)
AST: 27 IU/L (ref 0–40)
Albumin/Globulin Ratio: 1.5 (ref 1.2–2.2)
Albumin: 4.2 g/dL (ref 3.7–4.7)
Alkaline Phosphatase: 64 IU/L (ref 48–121)
BUN/Creatinine Ratio: 25 (ref 12–28)
BUN: 21 mg/dL (ref 8–27)
Bilirubin Total: 0.4 mg/dL (ref 0.0–1.2)
CO2: 24 mmol/L (ref 20–29)
Calcium: 10.8 mg/dL — ABNORMAL HIGH (ref 8.7–10.3)
Chloride: 102 mmol/L (ref 96–106)
Creatinine, Ser: 0.84 mg/dL (ref 0.57–1.00)
GFR calc Af Amer: 80 mL/min/{1.73_m2} (ref >59)
GFR calc non Af Amer: 69 mL/min/{1.73_m2} (ref >59)
Globulin, Total: 2.8 g/dL (ref 1.5–4.5)
Glucose: 76 mg/dL (ref 65–99)
Potassium: 4.7 mmol/L (ref 3.5–5.2)
Sodium: 142 mmol/L (ref 134–144)
Total Protein: 7 g/dL (ref 6.0–8.5)

## 2020-02-01 LAB — CBC WITH DIFFERENTIAL/PLATELET
Basophils Absolute: 0 10*3/uL (ref 0.0–0.2)
Basos: 1 %
EOS (ABSOLUTE): 0.5 10*3/uL — ABNORMAL HIGH (ref 0.0–0.4)
Eos: 9 %
Hematocrit: 39.5 % (ref 34.0–46.6)
Hemoglobin: 13 g/dL (ref 11.1–15.9)
Immature Grans (Abs): 0 10*3/uL (ref 0.0–0.1)
Immature Granulocytes: 0 %
Lymphocytes Absolute: 1.9 10*3/uL (ref 0.7–3.1)
Lymphs: 35 %
MCH: 27.5 pg (ref 26.6–33.0)
MCHC: 32.9 g/dL (ref 31.5–35.7)
MCV: 84 fL (ref 79–97)
Monocytes Absolute: 0.6 10*3/uL (ref 0.1–0.9)
Monocytes: 10 %
Neutrophils Absolute: 2.4 10*3/uL (ref 1.4–7.0)
Neutrophils: 45 %
Platelets: 204 10*3/uL (ref 150–450)
RBC: 4.73 x10E6/uL (ref 3.77–5.28)
RDW: 12.5 % (ref 11.7–15.4)
WBC: 5.3 10*3/uL (ref 3.4–10.8)

## 2020-02-01 LAB — LIPID PANEL
Chol/HDL Ratio: 3.2 ratio (ref 0.0–4.4)
Cholesterol, Total: 161 mg/dL (ref 100–199)
HDL: 50 mg/dL (ref >39)
LDL Chol Calc (NIH): 91 mg/dL (ref 0–99)
Triglycerides: 110 mg/dL (ref 0–149)
VLDL Cholesterol Cal: 20 mg/dL (ref 5–40)

## 2020-03-04 ENCOUNTER — Encounter: Payer: Self-pay | Admitting: Family Medicine

## 2020-03-04 ENCOUNTER — Other Ambulatory Visit: Payer: Self-pay

## 2020-03-04 ENCOUNTER — Ambulatory Visit: Payer: Medicare PPO | Admitting: Family Medicine

## 2020-03-04 VITALS — BP 130/78 | HR 62 | Temp 97.8°F | Wt 133.6 lb

## 2020-03-04 DIAGNOSIS — M79661 Pain in right lower leg: Secondary | ICD-10-CM

## 2020-03-04 NOTE — Progress Notes (Signed)
   Subjective:    Patient ID: Danielle Yoder, female    DOB: 01-18-1946, 74 y.o.   MRN: [id number]  HPI She complains of a 1 week history of right lateral calf discomfort that mainly occurs in the morning.  She can usually take a pain medicine and it lasts the whole day.  No history of injury, increase activity, sitting for long periods of time.  She states the pain is 8 out of 10 but does drop down to a 2 out of 10 with minimal pain medication intervention.   Review of Systems     Objective:   Physical Exam Exam of the right leg shows no swelling, redness, tenderness.  Denna Haggard' sign is negative.  Full motion of the ankle without pain.  No palpable tenderness or lesions are noted.      Assessment & Plan:  Right calf pain Will treat with 2 Aleve twice per day for the next week and if continued difficulty reevaluate and possibly get an ultrasound on this area.

## 2020-03-04 NOTE — Patient Instructions (Signed)
Take 2 Aleve twice per day for the next week

## 2020-03-18 ENCOUNTER — Ambulatory Visit: Payer: Medicare PPO | Admitting: Family Medicine

## 2020-03-19 ENCOUNTER — Ambulatory Visit
Admission: RE | Admit: 2020-03-19 | Discharge: 2020-03-19 | Disposition: A | Discharge status: Home / Self Care | Payer: Medicare PPO | Source: Ambulatory Visit | Attending: Family Medicine | Admitting: Family Medicine

## 2020-03-19 ENCOUNTER — Other Ambulatory Visit: Payer: Self-pay

## 2020-03-19 ENCOUNTER — Ambulatory Visit: Payer: Medicare PPO | Admitting: Family Medicine

## 2020-03-19 ENCOUNTER — Encounter: Payer: Self-pay | Admitting: Family Medicine

## 2020-03-19 VITALS — BP 116/72 | HR 72 | Temp 97.3°F | Wt 132.6 lb

## 2020-03-19 DIAGNOSIS — M79661 Pain in right lower leg: Secondary | ICD-10-CM

## 2020-03-19 DIAGNOSIS — M79604 Pain in right leg: Secondary | ICD-10-CM | POA: Diagnosis not present

## 2020-03-19 NOTE — Progress Notes (Signed)
° °  Subjective:    Patient ID: Danielle Yoder, female    DOB: 11/29/1945, 73 y.o.   MRN: [id number]  HPI She is here for recheck on right calf pain.  This is been going on approximately 3 to 4 weeks.  She did use 2 Aleve twice per day and got no real benefit.  No history of injury, overuse, chest pain, shortness of breath, knee hip or ankle discomfort.  The pain does seem to be worse in the morning.   Review of Systems     Objective:   Physical Exam Alert and in no distress.  The calf size is equal bilaterally.  Negative Homans' sign.  It is not warm or tender.  No palpable lesions noted.  No pain on plantar flexion.       Assessment & Plan:  Right calf pain - Plan: DG Tibia/Fibula Right I explained that we need to get an x-ray to make sure there is no's and bony abnormalities and if negative, I will refer for possible ultrasound of this area.

## 2020-03-20 ENCOUNTER — Other Ambulatory Visit: Payer: Self-pay

## 2020-03-20 DIAGNOSIS — M79661 Pain in right lower leg: Secondary | ICD-10-CM

## 2020-03-25 ENCOUNTER — Ambulatory Visit: Payer: Medicare PPO | Admitting: Orthopedic Surgery

## 2020-03-25 ENCOUNTER — Other Ambulatory Visit: Payer: Self-pay

## 2020-03-25 ENCOUNTER — Encounter: Payer: Self-pay | Admitting: Orthopedic Surgery

## 2020-03-25 ENCOUNTER — Ambulatory Visit (INDEPENDENT_AMBULATORY_CARE_PROVIDER_SITE_OTHER): Payer: Medicare PPO

## 2020-03-25 VITALS — Ht 59.75 in | Wt 132.6 lb

## 2020-03-25 DIAGNOSIS — M5410 Radiculopathy, site unspecified: Secondary | ICD-10-CM | POA: Diagnosis not present

## 2020-03-25 DIAGNOSIS — M545 Low back pain, unspecified: Secondary | ICD-10-CM

## 2020-03-25 MED ORDER — PREDNISONE 10 MG PO TABS: 20.0000 mg | ORAL_TABLET | Freq: Every day | ORAL | 0 refills | Status: DC

## 2020-03-25 NOTE — Progress Notes (Signed)
Office Visit Note   Patient: Danielle Yoder           Date of Birth: 1946/04/27           MRN: [id number] Visit Date: 03/25/2020              Requested by: [name], MD [address] [place],  [place] [address] PCP: [name], MD  Chief Complaint  Patient presents with  . Right Leg - Pain, New Patient (Initial Visit)      HPI: Patient is a 74 year old woman who presents complaining of lateral right calf pain with symptoms for about 3 weeks.  Patient states she has a bone density is scheduled for August 5.  She has had x-rays of her right tibia and fibula on July 7 which are normal.  Patient states her pain is worse in the morning she has been using Aleve without relief denies any specific injuries.  Assessment & Plan: Visit Diagnoses:  1. Low back pain, unspecified back pain laterality, unspecified chronicity, unspecified whether sciatica present   2. Radicular pain of right lower extremity     Plan: Patient does have radicular symptoms down the right lower extremity we will start her on prednisone low dose at breakfast.  At follow-up if she is not improving will evaluate for an MRI scan.  Follow-Up Instructions: Return in about 3 weeks (around 04/15/2020).   Ortho Exam  Patient is alert, oriented, no adenopathy, well-dressed, normal affect, normal respiratory effort. Examination patient has full range of motion of her hips knees and ankles no restrictions with range of motion of the joints.  She has a good dorsalis pedis pulse she has a negative straight leg raise and motor strength is 5/5 in all motor groups of both lower extremities.  The superficial peroneal nerve is asymptomatic.  Her calf is nontender to compression dorsiflexion the ankle does not reproduce any pain no evidence of DVT.  Radiographs show facet arthropathy worse at L4-5 and S1.  Imaging: No results found. No images are attached to the encounter.  Labs: Lab Results  Component Value  Date   HGBA1C 6.1 (H) 01/07/2014     Lab Results  Component Value Date   ALBUMIN 4.2 01/31/2020   ALBUMIN 4.3 01/30/2019   ALBUMIN 4.2 12/13/2017    No results found for: MG No results found for: VD25OH  No results found for: PREALBUMIN CBC EXTENDED Latest Ref Rng & Units 01/31/2020 01/30/2019 12/13/2017  WBC 3.4 - 10.8 x10E3/uL 5.3 6.0 6.7  RBC 3.77 - 5.28 x10E6/uL 4.73 4.69 4.90  HGB 11.1 - 15.9 g/dL 49.6 75.9 16.3  HCT 84.6 - 46.6 % 39.5 39.1 41.7  PLT 150 - 450 x10E3/uL 204 238 243  NEUTROABS 1 - 7 x10E3/uL 2.4 2.7 3.2  LYMPHSABS 0 - 3 x10E3/uL 1.9 2.1 2.2     Body mass index is 26.11 kg/m.  Orders:  Orders Placed This Encounter  Procedures  . XR Lumbar Spine 2-3 Views   Meds ordered this encounter  Medications  . predniSONE (DELTASONE) 10 MG tablet    Sig: Take 2 tablets (20 mg total) by mouth daily with breakfast.    Dispense:  60 tablet    Refill:  0     Procedures: No procedures performed  Clinical Data: No additional findings.  ROS:  All other systems negative, except as noted in the HPI. Review of Systems  Objective: Vital Signs: Ht 4' 11.75" (1.518 m)  Wt 132 lb 9.6 oz (60.1 kg)   BMI 26.11 kg/m   Specialty Comments:  No specialty comments available.  PMFS History: Patient Active Problem List   Diagnosis Date Noted  . Osteopenia 02/17/2018  . Family history of heart disease in female family member before age 72 05/31/2011  . Arthritis 05/31/2011  . Hyperlipidemia with target LDL less than 100 05/31/2011  . Hypertension 05/31/2011  . Allergic rhinitis due to pollen 05/31/2011  . History of colonic polyps 05/31/2011   Past Medical History:  Diagnosis Date  . Allergy    RHINITIS  . Arthritis   . Colonic polyp   . Dyslipidemia   . Hemorrhoids   . Hypertension     Family History  Problem Relation Age of Onset  . Heart disease Mother 42       MI  . Cancer Father   . Hypertension Sister   . Breast cancer Sister   . Hypertension  Brother   . Heart disease Brother 50       stent    Past Surgical History:  Procedure Laterality Date  . FOOT SURGERY Right    bone shaved off; 2 surgeries (recurred 8 yrs later)  . FOOT SURGERY Left    bone shaved off ; 2 surgeries (recurred)   Social History   Occupational History  . Not on file  Tobacco Use  . Smoking status: Never Smoker  . Smokeless tobacco: Never Used  Substance and Sexual Activity  . Alcohol use: No  . Drug use: No  . Sexual activity: Yes

## 2020-04-01 ENCOUNTER — Telehealth: Payer: Self-pay | Admitting: Orthopedic Surgery

## 2020-04-01 ENCOUNTER — Encounter: Payer: Self-pay | Admitting: Orthopedic Surgery

## 2020-04-01 NOTE — Telephone Encounter (Signed)
Patient called. Says she is still in a lot of pain. Would like a MRI. Her call back number is [phone]

## 2020-04-01 NOTE — Telephone Encounter (Signed)
this pt was in the office on 03/25/20 for LBP no dictation available. had been given rx for prednisone at that office visit that is not helping and  she is having continued pain and would like an MRI.   Please advise.

## 2020-04-01 NOTE — Telephone Encounter (Signed)
Order placed

## 2020-04-01 NOTE — Telephone Encounter (Signed)
Pt has an appt for MRI on 04/20/20 and will follow up in the office for review with Dr. Lajoyce Corners 8/10/21pt states that she does not want to continue with the prednisone and that she has stopped taking this because it was not helping she will continue to use the OTC pain meds that she has been taking and then heat periodically and then once we have the results of the MRI will best know how to treat. Will call with any questions.

## 2020-04-01 NOTE — Addendum Note (Signed)
Addended by: Aldean Baker on: 04/01/2020 12:34 PM   Modules accepted: Orders

## 2020-04-15 ENCOUNTER — Ambulatory Visit: Payer: Medicare PPO | Admitting: Orthopedic Surgery

## 2020-04-17 ENCOUNTER — Ambulatory Visit
Admission: RE | Admit: 2020-04-17 | Discharge: 2020-04-17 | Disposition: A | Discharge status: Home / Self Care | Payer: Medicare PPO | Source: Ambulatory Visit | Attending: Family Medicine | Admitting: Family Medicine

## 2020-04-17 ENCOUNTER — Other Ambulatory Visit: Payer: Self-pay

## 2020-04-17 DIAGNOSIS — Z780 Asymptomatic menopausal state: Secondary | ICD-10-CM | POA: Diagnosis not present

## 2020-04-17 DIAGNOSIS — M85852 Other specified disorders of bone density and structure, left thigh: Secondary | ICD-10-CM | POA: Diagnosis not present

## 2020-04-20 ENCOUNTER — Other Ambulatory Visit: Payer: Self-pay

## 2020-04-20 ENCOUNTER — Ambulatory Visit
Admission: RE | Admit: 2020-04-20 | Discharge: 2020-04-20 | Disposition: A | Discharge status: Home / Self Care | Payer: Medicare PPO | Source: Ambulatory Visit | Attending: Orthopedic Surgery | Admitting: Orthopedic Surgery

## 2020-04-20 DIAGNOSIS — M48061 Spinal stenosis, lumbar region without neurogenic claudication: Secondary | ICD-10-CM | POA: Diagnosis not present

## 2020-04-20 DIAGNOSIS — M5410 Radiculopathy, site unspecified: Secondary | ICD-10-CM

## 2020-04-21 ENCOUNTER — Other Ambulatory Visit: Payer: Self-pay

## 2020-04-21 DIAGNOSIS — M545 Low back pain, unspecified: Secondary | ICD-10-CM

## 2020-04-21 DIAGNOSIS — M5410 Radiculopathy, site unspecified: Secondary | ICD-10-CM

## 2020-04-22 ENCOUNTER — Ambulatory Visit: Payer: Medicare PPO | Admitting: Orthopedic Surgery

## 2020-04-22 ENCOUNTER — Telehealth: Payer: Self-pay | Admitting: Physical Medicine and Rehabilitation

## 2020-04-22 NOTE — Telephone Encounter (Signed)
Patient called. Would like to schedule an appointment with Dr. Newton.  °

## 2020-04-23 ENCOUNTER — Other Ambulatory Visit: Payer: Self-pay

## 2020-04-23 ENCOUNTER — Other Ambulatory Visit: Payer: Self-pay | Admitting: Family

## 2020-04-23 MED ORDER — TRAMADOL HCL 50 MG PO TABS: 50.0000 mg | ORAL_TABLET | Freq: Four times a day (QID) | ORAL | 0 refills | Status: DC | PRN

## 2020-04-23 NOTE — Telephone Encounter (Signed)
I called patient to advise that we need to get authorization from [facility] before we can schedule her injection. She states that she is going on vacation and wants to know if there is anything other than advil that she can take for the pain. She states that prednisone did not really help.Please advise.

## 2020-04-23 NOTE — Telephone Encounter (Signed)
I called pt and she used the [facility] on [address]. Rx for tramadol was sent to the pharm for her per [facility]. Pt aware and will call with any questions.

## 2020-04-23 NOTE — Telephone Encounter (Signed)
Please see note from yesterday pt was referred to [facility] for ESI see message below.

## 2020-04-24 ENCOUNTER — Ambulatory Visit: Payer: Medicare PPO | Admitting: Family Medicine

## 2020-04-24 ENCOUNTER — Other Ambulatory Visit: Payer: Self-pay

## 2020-04-24 VITALS — BP 132/78 | HR 65 | Temp 97.4°F | Wt 131.8 lb

## 2020-04-24 DIAGNOSIS — N289 Disorder of kidney and ureter, unspecified: Secondary | ICD-10-CM | POA: Diagnosis not present

## 2020-04-24 DIAGNOSIS — M545 Low back pain, unspecified: Secondary | ICD-10-CM

## 2020-04-24 NOTE — Progress Notes (Signed)
   Subjective:    Patient ID: Danielle Yoder, female    DOB: [birth year], 74 y.o.   MRN: [id number]  HPI She is here for consult concerning recent MRI.  It did show evidence of a renal lesion indicating need for follow-up.  She was seen by orthopedics and apparently there is nothing surgical that appeared on the MRI however she is scheduled to see Dr. Naaman Plummer for possible epidural injection.  The MRI did show some subarticular stenosis that might be amenable to epidural.   Review of Systems     Objective:   Physical Exam Alert and in no distress otherwise not examined       Assessment & Plan:  Low back pain without sciatica, unspecified back pain laterality, unspecified chronicity  Renal lesion - Plan: US Renal Discussed the possible epidural injection and she is to follow-up with Dr. Alvester Morin concerning that.  We will also set her up for ultrasound

## 2020-04-25 ENCOUNTER — Telehealth: Payer: Self-pay

## 2020-04-25 NOTE — Telephone Encounter (Signed)
I called and sw pt and she states that she would like for Dr. Alvester Morin. She saw her PCP for a renal cyst that was found on MRI and that he highly recommended FN and she would like to wait for him and it was ok if it took a couple of weeks.

## 2020-04-29 ENCOUNTER — Telehealth: Payer: Self-pay | Admitting: Physical Medicine and Rehabilitation

## 2020-04-29 NOTE — Telephone Encounter (Signed)
Scheduled

## 2020-04-29 NOTE — Telephone Encounter (Signed)
Patient called requesting a call back about appt. Patient returning call from [place]. Please call patient about setting an appt at [phone].

## 2020-05-07 ENCOUNTER — Other Ambulatory Visit: Payer: Self-pay

## 2020-05-07 ENCOUNTER — Ambulatory Visit: Payer: Medicare PPO | Admitting: Physical Medicine and Rehabilitation

## 2020-05-07 ENCOUNTER — Ambulatory Visit: Payer: Self-pay

## 2020-05-07 ENCOUNTER — Encounter: Payer: Self-pay | Admitting: Physical Medicine and Rehabilitation

## 2020-05-07 DIAGNOSIS — M5416 Radiculopathy, lumbar region: Secondary | ICD-10-CM

## 2020-05-07 MED ORDER — METHYLPREDNISOLONE ACETATE 80 MG/ML IJ SUSP
80.0000 mg | Freq: Once | INTRAMUSCULAR | Status: AC
  Administered 2020-05-07: 80 mg

## 2020-05-07 NOTE — Progress Notes (Signed)
Pt states right calf aching pain that sometime come to her knee. Pt state waking up with pain in her calf. Pt states walking makes the pain worse. Pt state she has to rest to get sum relief.  Numeric Pain Rating Scale and Functional Assessment Average Pain 6   In the last MONTH (on 0-10 scale) has pain interfered with the following?  1. General activity like being  able to carry out your everyday physical activities such as walking, climbing stairs, carrying groceries, or moving a chair?  Rating(9)   +Driver, -BT, -Dye Allergies.

## 2020-05-20 ENCOUNTER — Telehealth: Payer: Self-pay | Admitting: Physical Medicine and Rehabilitation

## 2020-05-20 NOTE — Telephone Encounter (Signed)
Patient called.   She was told to call us if the pain persisted after the injection. It did persist so she is requesting a call back  Call back: [phone]

## 2020-05-21 NOTE — Telephone Encounter (Signed)
If helped a lot but not long then ok to repeat S1 tf esi, if no help then L5 tf esi

## 2020-05-21 NOTE — Telephone Encounter (Signed)
Please advise 

## 2020-05-21 NOTE — Telephone Encounter (Signed)
Needs auth and scheduling for [address]- S1 TF.

## 2020-05-22 NOTE — Telephone Encounter (Signed)
Scheduled for 9/22 at 1345 with driver.

## 2020-05-22 NOTE — Telephone Encounter (Signed)
Pt was approve, pt can be sch.

## 2020-05-25 NOTE — Procedures (Signed)
S1 Lumbosacral Transforaminal Epidural Steroid Injection - Sub-Pedicular Approach with Fluoroscopic Guidance   Patient: Danielle Yoder      Date of Birth: 14-Apr-1946 MRN: [id number] PCP: [name], MD      Visit Date: 05/07/2020   Universal Protocol:    Date/Time: 09/12/215:12 PM  Consent Given By: the patient  Position:  PRONE  Additional Comments: Vital signs were monitored before and after the procedure. Patient was prepped and draped in the usual sterile fashion. The correct patient, procedure, and site was verified.   Injection Procedure Details:  Procedure Site One Meds Administered:  Meds ordered this encounter  Medications  . methylPREDNISolone acetate (DEPO-MEDROL) injection 80 mg    Laterality: Right  Location/Site:  S1 Foramen   Needle size: 22 ga.  Needle type: Spinal  Needle Placement: Transforaminal  Findings:   -Comments: Excellent flow of contrast along the nerve and into the epidural space.  Epidurogram: Contrast epidurogram showed no nerve root cut off or restricted flow pattern.  Procedure Details: After squaring off the sacral end-plate to get a true AP view, the C-arm was positioned so that the best possible view of the S1 foramen was visualized. The soft tissues overlying this structure were infiltrated with 2-3 ml. of 1% Lidocaine without Epinephrine.    The spinal needle was inserted toward the target using a "trajectory" view along the fluoroscope beam.  Under AP and lateral visualization, the needle was advanced so it did not puncture dura. Biplanar projections were used to confirm position. Aspiration was confirmed to be negative for CSF and/or blood. A 1-2 ml. volume of Isovue-250 was injected and flow of contrast was noted at each level. Radiographs were obtained for documentation purposes.   After attaining the desired flow of contrast documented above, a 0.5 to 1.0 ml test dose of 0.25% Marcaine was injected into each  respective transforaminal space.  The patient was observed for 90 seconds post injection.  After no sensory deficits were reported, and normal lower extremity motor function was noted,   the above injectate was administered so that equal amounts of the injectate were placed at each foramen (level) into the transforaminal epidural space.   Additional Comments:  The patient tolerated the procedure well Dressing: Band-Aid with 2 x 2 sterile gauze    Post-procedure details: Patient was observed during the procedure. Post-procedure instructions were reviewed.  Patient left the clinic in stable condition.

## 2020-05-25 NOTE — Progress Notes (Signed)
Danielle Yoder [place] - 74 y.o. female MRN [id number]  Date of birth: Oct 19, 1945  Office Visit Note: Visit Date: 05/07/2020 PCP: [name], MD Referred by: [name], MD  Subjective: No chief complaint on file.  HPI:  Danielle Yoder is a 74 y.o. female who comes in today at the request of Dr. Aldean Baker for planned Right S1-2 Lumbar epidural steroid injection with fluoroscopic guidance.  The patient has failed conservative care including home exercise, medications, time and activity modification.  This injection will be diagnostic and hopefully therapeutic.  Please see requesting physician notes for further details and justification.  MRI reviewed with images and spine model.  MRI reviewed in the note below.  Patient was severe 6 out of 10 pain that really limits what she can do.  She reports pain really into the calf sometimes buttock but mainly this skips to the calf and this calf pain is really what is giving her a lot of grief.  MRI does show grade 1 listhesis of L5 on S1 with arthritis at both joints at that level with lateral recess or subarticular narrowing.  She really has this on both sides no high-grade nerve compression.  She is also been followed closely with this by her primary care physician Dr. Sharlot Gowda.  If not getting relief could consider electrodiagnostic study.  ROS Otherwise per HPI.  Assessment & Plan: Visit Diagnoses:  1. Lumbar radiculopathy     Plan: No additional findings.   Meds & Orders:  Meds ordered this encounter  Medications  . methylPREDNISolone acetate (DEPO-MEDROL) injection 80 mg    Orders Placed This Encounter  Procedures  . XR C-ARM NO REPORT  . Epidural Steroid injection    Follow-up: Return if symptoms worsen or fail to improve.   Procedures: No procedures performed  S1 Lumbosacral Transforaminal Epidural Steroid Injection - Sub-Pedicular Approach with Fluoroscopic Guidance   Patient: Danielle Yoder      Date  of Birth: 19-Mar-1946 MRN: [id number] PCP: [name], MD      Visit Date: 05/07/2020   Universal Protocol:    Date/Time: 09/12/215:12 PM  Consent Given By: the patient  Position:  PRONE  Additional Comments: Vital signs were monitored before and after the procedure. Patient was prepped and draped in the usual sterile fashion. The correct patient, procedure, and site was verified.   Injection Procedure Details:  Procedure Site One Meds Administered:  Meds ordered this encounter  Medications  . methylPREDNISolone acetate (DEPO-MEDROL) injection 80 mg    Laterality: Right  Location/Site:  S1 Foramen   Needle size: 22 ga.  Needle type: Spinal  Needle Placement: Transforaminal  Findings:   -Comments: Excellent flow of contrast along the nerve and into the epidural space.  Epidurogram: Contrast epidurogram showed no nerve root cut off or restricted flow pattern.  Procedure Details: After squaring off the sacral end-plate to get a true AP view, the C-arm was positioned so that the best possible view of the S1 foramen was visualized. The soft tissues overlying this structure were infiltrated with 2-3 ml. of 1% Lidocaine without Epinephrine.    The spinal needle was inserted toward the target using a "trajectory" view along the fluoroscope beam.  Under AP and lateral visualization, the needle was advanced so it did not puncture dura. Biplanar projections were used to confirm position. Aspiration was confirmed to be negative for CSF and/or blood. A 1-2 ml. volume of Isovue-250 was injected and flow of contrast  was noted at each level. Radiographs were obtained for documentation purposes.   After attaining the desired flow of contrast documented above, a 0.5 to 1.0 ml test dose of 0.25% Marcaine was injected into each respective transforaminal space.  The patient was observed for 90 seconds post injection.  After no sensory deficits were reported, and normal lower extremity  motor function was noted,   the above injectate was administered so that equal amounts of the injectate were placed at each foramen (level) into the transforaminal epidural space.   Additional Comments:  The patient tolerated the procedure well Dressing: Band-Aid with 2 x 2 sterile gauze    Post-procedure details: Patient was observed during the procedure. Post-procedure instructions were reviewed.  Patient left the clinic in stable condition.     Clinical History: Low back pain with right-sided radiculopathy  EXAM: MRI LUMBAR SPINE WITHOUT CONTRAST  TECHNIQUE: Multiplanar, multisequence MR imaging of the lumbar spine was performed. No intravenous contrast was administered.  COMPARISON:  X-ray 03/25/2020  FINDINGS: Segmentation:  Standard.  Alignment:  3 mm anterolisthesis L5 on S1.  Vertebrae: No fracture, evidence of discitis, or bone lesion. Diffuse marrow heterogeneity without discrete marrow replacing lesion. Findings are nonspecific but can be seen in the setting of chronic anemia, smoking, and/or obesity.  Conus medullaris and cauda equina: Conus extends to the L1-2 level. Conus and cauda equina appear normal.  Paraspinal and other soft tissues: 1.2 cm rounded lesion within the mid to inferior pole of the right kidney which is low signal on T2 (series 12, image 12).  Disc levels:  T12-L1: Unremarkable.  L1-L2: Unremarkable.  L2-L3: Unremarkable.  L3-L4: Unremarkable.  L4-L5: Unremarkable disc. Mild bilateral facet hypertrophy. Mild bilateral subarticular recess stenosis. No foraminal or canal stenosis.  L5-S1: Grade 1 anterolisthesis with disc uncovering. Moderate bilateral facet arthropathy. Moderate bilateral subarticular recess stenosis without canal or foraminal stenosis.  IMPRESSION: 1. Moderate bilateral subarticular recess stenosis at L5-S1 and mild bilateral subarticular recess stenosis at L4-L5. 2. No canal stenosis at  any level. 3. Incidental 1.2 cm rounded lesion within the mid to inferior pole of the right kidney which is low signal on T2. Findings are nonspecific but may reflect cyst with internal proteinaceous or hemorrhagic content. Solid lesion not excluded. Further evaluation with renal ultrasound is recommended. 4. Diffuse marrow heterogeneity without discrete marrow replacing lesion. Findings are nonspecific but can be seen in the setting of chronic anemia, smoking, and/or obesity.   Electronically Signed   By: Duanne Guess D.O.   On: 04/20/2020 17:40     Objective:  VS:  HT:    WT:   BMI:     BP:   HR: bpm  TEMP: ( )  RESP:  Physical Exam Constitutional:      General: She is not in acute distress.    Appearance: Normal appearance. She is not ill-appearing.  HENT:     Head: Normocephalic and atraumatic.     Right Ear: External ear normal.     Left Ear: External ear normal.  Eyes:     Extraocular Movements: Extraocular movements intact.  Cardiovascular:     Rate and Rhythm: Normal rate.     Pulses: Normal pulses.  Musculoskeletal:     Right lower leg: No edema.     Left lower leg: No edema.     Comments: Patient has good distal strength with no pain over the greater trochanters.  No clonus or focal weakness.  Skin:    Findings:  No erythema, lesion or rash.  Neurological:     General: No focal deficit present.     Mental Status: She is alert and oriented to person, place, and time.     Sensory: No sensory deficit.     Motor: No weakness or abnormal muscle tone.     Coordination: Coordination normal.  Psychiatric:        Mood and Affect: Mood normal.        Behavior: Behavior normal.      Imaging: No results found.

## 2020-06-04 ENCOUNTER — Encounter: Payer: Self-pay | Admitting: Physical Medicine and Rehabilitation

## 2020-06-04 ENCOUNTER — Other Ambulatory Visit: Payer: Self-pay

## 2020-06-04 ENCOUNTER — Ambulatory Visit: Payer: Self-pay

## 2020-06-04 ENCOUNTER — Ambulatory Visit: Payer: Medicare PPO | Admitting: Physical Medicine and Rehabilitation

## 2020-06-04 VITALS — BP 140/78 | HR 59

## 2020-06-04 DIAGNOSIS — M5416 Radiculopathy, lumbar region: Secondary | ICD-10-CM

## 2020-06-04 MED ORDER — METHYLPREDNISOLONE ACETATE 80 MG/ML IJ SUSP
80.0000 mg | Freq: Once | INTRAMUSCULAR | Status: AC
  Administered 2020-06-04: 80 mg

## 2020-06-04 NOTE — Procedures (Signed)
S1 Lumbosacral Transforaminal Epidural Steroid Injection - Sub-Pedicular Approach with Fluoroscopic Guidance   Patient: Danielle Yoder      Date of Birth: [birth year] MRN: [id number] PCP: [name], MD      Visit Date: 06/04/2020   Universal Protocol:    Date/Time: 09/22/212:00 PM  Consent Given By: the patient  Position:  PRONE  Additional Comments: Vital signs were monitored before and after the procedure. Patient was prepped and draped in the usual sterile fashion. The correct patient, procedure, and site was verified.   Injection Procedure Details:  Procedure Site One Meds Administered:  Meds ordered this encounter  Medications  . methylPREDNISolone acetate (DEPO-MEDROL) injection 80 mg    Laterality: Right  Location/Site:  S1 Foramen   Needle size: 22 ga.  Needle type: Spinal  Needle Placement: Transforaminal  Findings:   -Comments: Excellent flow of contrast along the nerve and into the epidural space.   Procedure Details: After squaring off the sacral end-plate to get a true AP view, the C-arm was positioned so that the best possible view of the S1 foramen was visualized. The soft tissues overlying this structure were infiltrated with 2-3 ml. of 1% Lidocaine without Epinephrine.    The spinal needle was inserted toward the target using a "trajectory" view along the fluoroscope beam.  Under AP and lateral visualization, the needle was advanced so it did not puncture dura. Biplanar projections were used to confirm position. Aspiration was confirmed to be negative for CSF and/or blood. A 1-2 ml. volume of Isovue-250 was injected and flow of contrast was noted at each level. Radiographs were obtained for documentation purposes.   After attaining the desired flow of contrast documented above, a 0.5 to 1.0 ml test dose of 0.25% Marcaine was injected into each respective transforaminal space.  The patient was observed for 90 seconds post injection.  After no  sensory deficits were reported, and normal lower extremity motor function was noted,   the above injectate was administered so that equal amounts of the injectate were placed at each foramen (level) into the transforaminal epidural space.   Additional Comments:  The patient tolerated the procedure well Dressing: Band-Aid with 2 x 2 sterile gauze    Post-procedure details: Patient was observed during the procedure. Post-procedure instructions were reviewed.  Patient left the clinic in stable condition.

## 2020-06-04 NOTE — Progress Notes (Signed)
Danielle Yoder - 74 y.o. female MRN [id number]  Date of birth: 30-Nov-1945  Office Visit Note: Visit Date: 06/04/2020 PCP: [name], MD Referred by: [name], MD  Subjective: Chief Complaint  Patient presents with  . Lower Back - Pain   HPI:  Danielle Yoder is a 74 y.o. female who comes in today for planned repeat Right S1-2 Lumbar epidural steroid injection with fluoroscopic guidance.  The patient has failed conservative care including home exercise, medications, time and activity modification.  This injection will be diagnostic and hopefully therapeutic.  Please see requesting physician notes for further details and justification. Patient received more than 50% pain relief from prior injection.   Referring: Dr. Aldean Baker   Prior injection did offer more than 50% relief as noted above and did give the patient more functional ability for activities of daily living and was also beneficial in that it did reduce her medication requirement.  Procedures are done as part of a comprehensive orthopedic and pain management program.  ROS Otherwise per HPI.  Assessment & Plan: Visit Diagnoses:  1. Lumbar radiculopathy     Plan: No additional findings.   Meds & Orders:  Meds ordered this encounter  Medications  . methylPREDNISolone acetate (DEPO-MEDROL) injection 80 mg    Orders Placed This Encounter  Procedures  . XR C-ARM NO REPORT  . Epidural Steroid injection    Follow-up: Return if symptoms worsen or fail to improve.   Procedures: No procedures performed  S1 Lumbosacral Transforaminal Epidural Steroid Injection - Sub-Pedicular Approach with Fluoroscopic Guidance   Patient: Danielle Yoder      Date of Birth: 1945/11/07 MRN: [id number] PCP: [name], MD      Visit Date: 06/04/2020   Universal Protocol:    Date/Time: 09/22/212:00 PM  Consent Given By: the patient  Position:  PRONE  Additional Comments: Vital signs were monitored before  and after the procedure. Patient was prepped and draped in the usual sterile fashion. The correct patient, procedure, and site was verified.   Injection Procedure Details:  Procedure Site One Meds Administered:  Meds ordered this encounter  Medications  . methylPREDNISolone acetate (DEPO-MEDROL) injection 80 mg    Laterality: Right  Location/Site:  S1 Foramen   Needle size: 22 ga.  Needle type: Spinal  Needle Placement: Transforaminal  Findings:   -Comments: Excellent flow of contrast along the nerve and into the epidural space.   Procedure Details: After squaring off the sacral end-plate to get a true AP view, the C-arm was positioned so that the best possible view of the S1 foramen was visualized. The soft tissues overlying this structure were infiltrated with 2-3 ml. of 1% Lidocaine without Epinephrine.    The spinal needle was inserted toward the target using a "trajectory" view along the fluoroscope beam.  Under AP and lateral visualization, the needle was advanced so it did not puncture dura. Biplanar projections were used to confirm position. Aspiration was confirmed to be negative for CSF and/or blood. A 1-2 ml. volume of Isovue-250 was injected and flow of contrast was noted at each level. Radiographs were obtained for documentation purposes.   After attaining the desired flow of contrast documented above, a 0.5 to 1.0 ml test dose of 0.25% Marcaine was injected into each respective transforaminal space.  The patient was observed for 90 seconds post injection.  After no sensory deficits were reported, and normal lower extremity motor function was noted,   the above  injectate was administered so that equal amounts of the injectate were placed at each foramen (level) into the transforaminal epidural space.   Additional Comments:  The patient tolerated the procedure well Dressing: Band-Aid with 2 x 2 sterile gauze    Post-procedure details: Patient was observed  during the procedure. Post-procedure instructions were reviewed.  Patient left the clinic in stable condition.     Clinical History: Low back pain with right-sided radiculopathy  EXAM: MRI LUMBAR SPINE WITHOUT CONTRAST  TECHNIQUE: Multiplanar, multisequence MR imaging of the lumbar spine was performed. No intravenous contrast was administered.  COMPARISON:  X-ray 03/25/2020  FINDINGS: Segmentation:  Standard.  Alignment:  3 mm anterolisthesis L5 on S1.  Vertebrae: No fracture, evidence of discitis, or bone lesion. Diffuse marrow heterogeneity without discrete marrow replacing lesion. Findings are nonspecific but can be seen in the setting of chronic anemia, smoking, and/or obesity.  Conus medullaris and cauda equina: Conus extends to the L1-2 level. Conus and cauda equina appear normal.  Paraspinal and other soft tissues: 1.2 cm rounded lesion within the mid to inferior pole of the right kidney which is low signal on T2 (series 12, image 12).  Disc levels:  T12-L1: Unremarkable.  L1-L2: Unremarkable.  L2-L3: Unremarkable.  L3-L4: Unremarkable.  L4-L5: Unremarkable disc. Mild bilateral facet hypertrophy. Mild bilateral subarticular recess stenosis. No foraminal or canal stenosis.  L5-S1: Grade 1 anterolisthesis with disc uncovering. Moderate bilateral facet arthropathy. Moderate bilateral subarticular recess stenosis without canal or foraminal stenosis.  IMPRESSION: 1. Moderate bilateral subarticular recess stenosis at L5-S1 and mild bilateral subarticular recess stenosis at L4-L5. 2. No canal stenosis at any level. 3. Incidental 1.2 cm rounded lesion within the mid to inferior pole of the right kidney which is low signal on T2. Findings are nonspecific but may reflect cyst with internal proteinaceous or hemorrhagic content. Solid lesion not excluded. Further evaluation with renal ultrasound is recommended. 4. Diffuse marrow heterogeneity  without discrete marrow replacing lesion. Findings are nonspecific but can be seen in the setting of chronic anemia, smoking, and/or obesity.   Electronically Signed   By: Duanne Guess D.O.   On: 04/20/2020 17:40     Objective:  VS:  HT:    WT:   BMI:     BP:140/78  HR:(!) 59bpm  TEMP: ( )  RESP:  Physical Exam Constitutional:      General: She is not in acute distress.    Appearance: Normal appearance. She is not ill-appearing.  HENT:     Head: Normocephalic and atraumatic.     Right Ear: External ear normal.     Left Ear: External ear normal.  Eyes:     Extraocular Movements: Extraocular movements intact.  Cardiovascular:     Rate and Rhythm: Normal rate.     Pulses: Normal pulses.  Musculoskeletal:     Right lower leg: No edema.     Left lower leg: No edema.     Comments: Patient has good distal strength with no pain over the greater trochanters.  No clonus or focal weakness.  Skin:    Findings: No erythema, lesion or rash.  Neurological:     General: No focal deficit present.     Mental Status: She is alert and oriented to person, place, and time.     Sensory: No sensory deficit.     Motor: No weakness or abnormal muscle tone.     Coordination: Coordination normal.  Psychiatric:        Mood and  Affect: Mood normal.        Behavior: Behavior normal.      Imaging: No results found.

## 2020-06-04 NOTE — Progress Notes (Signed)
Pt state lower back. Pt state mostly the pain is in her right leg down to her calf. Pt state pain pills to make it feel better. Pt has hx of inj on 05/07/20 pt state it was great for about two weeks then the pain returned.  Numeric Pain Rating Scale and Functional Assessment Average Pain 6   In the last MONTH (on 0-10 scale) has pain interfered with the following?  1. General activity like being  able to carry out your everyday physical activities such as walking, climbing stairs, carrying groceries, or moving a chair?  Rating(6)   +Driver, -BT, -Dye Allergies.

## 2020-06-12 DIAGNOSIS — Z20828 Contact with and (suspected) exposure to other viral communicable diseases: Secondary | ICD-10-CM | POA: Diagnosis not present

## 2020-06-14 ENCOUNTER — Other Ambulatory Visit: Payer: Self-pay | Admitting: Physician Assistant

## 2020-06-14 ENCOUNTER — Ambulatory Visit (HOSPITAL_COMMUNITY)
Admission: RE | Admit: 2020-06-14 | Discharge: 2020-06-14 | Disposition: A | Discharge status: Home / Self Care | Payer: Medicare Other | Source: Ambulatory Visit | Attending: Pulmonary Disease | Admitting: Pulmonary Disease

## 2020-06-14 DIAGNOSIS — E663 Overweight: Secondary | ICD-10-CM | POA: Diagnosis present

## 2020-06-14 DIAGNOSIS — Z23 Encounter for immunization: Secondary | ICD-10-CM | POA: Diagnosis not present

## 2020-06-14 DIAGNOSIS — U071 COVID-19: Secondary | ICD-10-CM

## 2020-06-14 DIAGNOSIS — I10 Essential (primary) hypertension: Secondary | ICD-10-CM | POA: Diagnosis present

## 2020-06-14 MED ORDER — SODIUM CHLORIDE 0.9 % IV SOLN
1200.0000 mg | Freq: Once | INTRAVENOUS | Status: AC
  Administered 2020-06-14: 1200 mg via INTRAVENOUS

## 2020-06-14 MED ORDER — DIPHENHYDRAMINE HCL 50 MG/ML IJ SOLN: 50.0000 mg | Freq: Once | INTRAMUSCULAR | Status: DC | PRN

## 2020-06-14 MED ORDER — SODIUM CHLORIDE 0.9 % IV SOLN: INTRAVENOUS | Status: DC | PRN

## 2020-06-14 MED ORDER — EPINEPHRINE 0.3 MG/0.3ML IJ SOAJ: 0.3000 mg | Freq: Once | INTRAMUSCULAR | Status: DC | PRN

## 2020-06-14 MED ORDER — FAMOTIDINE IN NACL 20-0.9 MG/50ML-% IV SOLN: 20.0000 mg | Freq: Once | INTRAVENOUS | Status: DC | PRN

## 2020-06-14 MED ORDER — ALBUTEROL SULFATE HFA 108 (90 BASE) MCG/ACT IN AERS: 2.0000 | INHALATION_SPRAY | Freq: Once | RESPIRATORY_TRACT | Status: DC | PRN

## 2020-06-14 MED ORDER — METHYLPREDNISOLONE SODIUM SUCC 125 MG IJ SOLR: 125.0000 mg | Freq: Once | INTRAMUSCULAR | Status: DC | PRN

## 2020-06-14 NOTE — Discharge Instructions (Signed)

## 2020-06-14 NOTE — Progress Notes (Signed)
  Diagnosis: COVID-19  Physician: Dr Wright  Procedure: [facility] Med: casirivimab\imdevimab infusion - Provided patient with casirivimab\imdevimab fact sheet for patients, parents and caregivers prior to infusion.  Complications: No immediate complications noted.  Discharge: Discharged home   Kaelei Wheeler L 06/14/2020  

## 2020-06-14 NOTE — Progress Notes (Signed)
I connected by phone with Danielle Yoder on 06/14/2020 at 8:41 AM to discuss the potential use of a new treatment for mild to moderate COVID-19 viral infection in non-hospitalized patients.  This patient is a 74 y.o. female that meets the FDA criteria for Emergency Use Authorization of COVID monoclonal antibody casirivimab/imdevimab or bamlanivimab/eteseviamb.  Has a (+) direct SARS-CoV-2 viral test result  Has mild or moderate COVID-19   Is NOT hospitalized due to COVID-19  Is within 10 days of symptom onset  Has at least one of the high risk factor(s) for progression to severe COVID-19 and/or hospitalization as defined in EUA.  Specific high risk criteria : Older age (>/= 73 yo), BMI > 25 and Cardiovascular disease or hypertension   I have spoken and communicated the following to the patient or parent/caregiver regarding COVID monoclonal antibody treatment:  1. FDA has authorized the emergency use for the treatment of mild to moderate COVID-19 in adults and pediatric patients with positive results of direct SARS-CoV-2 viral testing who are 28 years of age and older weighing at least 40 kg, and who are at high risk for progressing to severe COVID-19 and/or hospitalization.  2. The significant known and potential risks and benefits of COVID monoclonal antibody, and the extent to which such potential risks and benefits are unknown.  3. Information on available alternative treatments and the risks and benefits of those alternatives, including clinical trials.  4. Patients treated with COVID monoclonal antibody should continue to self-isolate and use infection control measures (e.g., wear mask, isolate, social distance, avoid sharing personal items, clean and disinfect "high touch" surfaces, and frequent handwashing) according to CDC guidelines.   5. The patient or parent/caregiver has the option to accept or refuse COVID monoclonal antibody treatment.  After reviewing this information  with the patient, the patient has agreed to receive one of the available covid 19 monoclonal antibodies and will be provided an appropriate fact sheet prior to infusion. Roe Rutherford [name], [place] 06/14/2020 8:41 AM

## 2020-06-18 ENCOUNTER — Encounter: Payer: Self-pay | Admitting: Family Medicine

## 2020-06-18 ENCOUNTER — Other Ambulatory Visit: Payer: Self-pay

## 2020-06-18 ENCOUNTER — Telehealth (INDEPENDENT_AMBULATORY_CARE_PROVIDER_SITE_OTHER): Payer: Medicare PPO | Admitting: Family Medicine

## 2020-06-18 VITALS — Temp 97.4°F | Wt 131.0 lb

## 2020-06-18 DIAGNOSIS — U071 COVID-19: Secondary | ICD-10-CM | POA: Diagnosis not present

## 2020-06-18 DIAGNOSIS — R059 Cough, unspecified: Secondary | ICD-10-CM

## 2020-06-18 NOTE — Progress Notes (Signed)
   Subjective:    Patient ID: Danielle Yoder, female    DOB: 11/13/1945, 74 y.o.   MRN: [id number]  HPI I connected with  Kasondra Junod Reif on 06/18/20 by a video enabled telemedicine application and verified that I am speaking with the correct person using two identifiers.  caregility used she is at home alone and I am in my office I discussed the limitations of evaluation and management by telemedicine. The patient expressed understanding and agreed to proceed. She started having sneezing, dry cough, rhinorrhea on September 27.  She was tested on September 30 and the test was positive.  She was given the IV antibodies on 10 2.  The same time she started having symptoms on the 27th, she also got her booster shot.  She has not lost smell or taste.  She does have a slight cough.  She is concerned about when she can get the flu shot.   Review of Systems     Objective:   Physical Exam Alert and in no distress otherwise not examined       Assessment & Plan:  COVID  Cough I explained that since she is now down the road roughly 10 days she is free to be out of quarantine.  She is having a slight cough and I did recommend using Robitussin-DM.  Also suggest to get the flu shot sometime next week.  She was comfortable with that.  8 minutes spent discussing all these issues with her.

## 2020-07-01 ENCOUNTER — Other Ambulatory Visit: Payer: Self-pay

## 2020-07-01 ENCOUNTER — Other Ambulatory Visit (INDEPENDENT_AMBULATORY_CARE_PROVIDER_SITE_OTHER): Payer: Medicare PPO

## 2020-07-01 ENCOUNTER — Telehealth: Payer: Self-pay | Admitting: Physical Medicine and Rehabilitation

## 2020-07-01 DIAGNOSIS — Z23 Encounter for immunization: Secondary | ICD-10-CM

## 2020-07-01 NOTE — Telephone Encounter (Signed)
Called pt back and sch for 11/15. °

## 2020-07-01 NOTE — Telephone Encounter (Signed)
Patient had right S1 TF on 9/22. Ok to repeat if helped, same problem/side, and no new injury?

## 2020-07-01 NOTE — Telephone Encounter (Signed)
Patient called. She would like an appointment with Dr. Alvester Morin. Her call back number is [phone]

## 2020-07-01 NOTE — Telephone Encounter (Signed)
Needs auth and scheduling for 64483-repeat S1 TF. Patient reports good relief from injection on 06/04/2020.

## 2020-07-01 NOTE — Telephone Encounter (Signed)
Pt was approve Authorization #[id number].

## 2020-07-01 NOTE — Telephone Encounter (Signed)
ok 

## 2020-07-10 ENCOUNTER — Telehealth: Payer: Self-pay | Admitting: Physical Medicine and Rehabilitation

## 2020-07-10 NOTE — Telephone Encounter (Signed)
Pt states she missed a call and believes it was for a sooner appt; pt would like a CB if that was the case  [phone]

## 2020-07-14 ENCOUNTER — Ambulatory Visit: Payer: Self-pay

## 2020-07-14 ENCOUNTER — Other Ambulatory Visit: Payer: Self-pay

## 2020-07-14 ENCOUNTER — Ambulatory Visit (INDEPENDENT_AMBULATORY_CARE_PROVIDER_SITE_OTHER): Payer: Medicare PPO | Admitting: Physical Medicine and Rehabilitation

## 2020-07-14 ENCOUNTER — Encounter: Payer: Self-pay | Admitting: Physical Medicine and Rehabilitation

## 2020-07-14 VITALS — BP 144/81 | HR 73

## 2020-07-14 DIAGNOSIS — M5416 Radiculopathy, lumbar region: Secondary | ICD-10-CM

## 2020-07-14 MED ORDER — METHYLPREDNISOLONE ACETATE 80 MG/ML IJ SUSP
80.0000 mg | Freq: Once | INTRAMUSCULAR | Status: AC
  Administered 2020-07-14: 80 mg

## 2020-07-14 NOTE — Procedures (Signed)
S1 Lumbosacral Transforaminal Epidural Steroid Injection - Sub-Pedicular Approach with Fluoroscopic Guidance   Patient: Danielle Yoder      Date of Birth: 1946-04-02 MRN: [id number] PCP: [name], MD      Visit Date: 07/14/2020   Universal Protocol:    Date/Time: 11/01/218:53 AM  Consent Given By: the patient  Position:  PRONE  Additional Comments: Vital signs were monitored before and after the procedure. Patient was prepped and draped in the usual sterile fashion. The correct patient, procedure, and site was verified.   Injection Procedure Details:  Procedure Site One Meds Administered:  Meds ordered this encounter  Medications  . methylPREDNISolone acetate (DEPO-MEDROL) injection 80 mg    Laterality: Right  Location/Site:  S1 Foramen   Needle size: 22 ga.  Needle type: Spinal  Needle Placement: Transforaminal  Findings:   -Comments: Excellent flow of contrast along the nerve and into the epidural space.   Procedure Details: After squaring off the sacral end-plate to get a true AP view, the C-arm was positioned so that the best possible view of the S1 foramen was visualized. The soft tissues overlying this structure were infiltrated with 2-3 ml. of 1% Lidocaine without Epinephrine.    The spinal needle was inserted toward the target using a "trajectory" view along the fluoroscope beam.  Under AP and lateral visualization, the needle was advanced so it did not puncture dura. Biplanar projections were used to confirm position. Aspiration was confirmed to be negative for CSF and/or blood. A 1-2 ml. volume of Isovue-250 was injected and flow of contrast was noted at each level. Radiographs were obtained for documentation purposes.   After attaining the desired flow of contrast documented above, a 0.5 to 1.0 ml test dose of 0.25% Marcaine was injected into each respective transforaminal space.  The patient was observed for 90 seconds post injection.  After no  sensory deficits were reported, and normal lower extremity motor function was noted,   the above injectate was administered so that equal amounts of the injectate were placed at each foramen (level) into the transforaminal epidural space.   Additional Comments:  The patient tolerated the procedure well Dressing: Band-Aid with 2 x 2 sterile gauze    Post-procedure details: Patient was observed during the procedure. Post-procedure instructions were reviewed.  Patient left the clinic in stable condition.

## 2020-07-14 NOTE — Progress Notes (Signed)
Danielle Yoder - 74 y.o. female MRN [id number]  Date of birth: Jul 26, 1946  Office Visit Note: Visit Date: 07/14/2020 PCP: [name], MD Referred by: [name], MD  Subjective: Chief Complaint  Patient presents with  . Right Leg - Pain   HPI:  Danielle Yoder is a 74 y.o. female who comes in today for planned repeat Right S1-2 Lumbar epidural steroid injection with fluoroscopic guidance.  The patient has failed conservative care including home exercise, medications, time and activity modification.  This injection will be diagnostic and hopefully therapeutic.  Please see requesting physician notes for further details and justification. Patient received more than 50% pain relief from prior injection.   Referring: Dr. Aldean Baker  Consider facet block vs spine surgey referral.  ROS Otherwise per HPI.  Assessment & Plan: Visit Diagnoses:  1. Lumbar radiculopathy     Plan: No additional findings.   Meds & Orders:  Meds ordered this encounter  Medications  . methylPREDNISolone acetate (DEPO-MEDROL) injection 80 mg    Orders Placed This Encounter  Procedures  . XR C-ARM NO REPORT  . Epidural Steroid injection    Follow-up: Return for visit to requesting physician as needed.   Procedures: No procedures performed  S1 Lumbosacral Transforaminal Epidural Steroid Injection - Sub-Pedicular Approach with Fluoroscopic Guidance   Patient: Danielle Yoder      Date of Birth: July 19, 1946 MRN: [id number] PCP: [name], MD      Visit Date: 07/14/2020   Universal Protocol:    Date/Time: 11/01/218:53 AM  Consent Given By: the patient  Position:  PRONE  Additional Comments: Vital signs were monitored before and after the procedure. Patient was prepped and draped in the usual sterile fashion. The correct patient, procedure, and site was verified.   Injection Procedure Details:  Procedure Site One Meds Administered:  Meds ordered this encounter    Medications  . methylPREDNISolone acetate (DEPO-MEDROL) injection 80 mg    Laterality: Right  Location/Site:  S1 Foramen   Needle size: 22 ga.  Needle type: Spinal  Needle Placement: Transforaminal  Findings:   -Comments: Excellent flow of contrast along the nerve and into the epidural space.   Procedure Details: After squaring off the sacral end-plate to get a true AP view, the C-arm was positioned so that the best possible view of the S1 foramen was visualized. The soft tissues overlying this structure were infiltrated with 2-3 ml. of 1% Lidocaine without Epinephrine.    The spinal needle was inserted toward the target using a "trajectory" view along the fluoroscope beam.  Under AP and lateral visualization, the needle was advanced so it did not puncture dura. Biplanar projections were used to confirm position. Aspiration was confirmed to be negative for CSF and/or blood. A 1-2 ml. volume of Isovue-250 was injected and flow of contrast was noted at each level. Radiographs were obtained for documentation purposes.   After attaining the desired flow of contrast documented above, a 0.5 to 1.0 ml test dose of 0.25% Marcaine was injected into each respective transforaminal space.  The patient was observed for 90 seconds post injection.  After no sensory deficits were reported, and normal lower extremity motor function was noted,   the above injectate was administered so that equal amounts of the injectate were placed at each foramen (level) into the transforaminal epidural space.   Additional Comments:  The patient tolerated the procedure well Dressing: Band-Aid with 2 x 2 sterile gauze  Post-procedure details: Patient was observed during the procedure. Post-procedure instructions were reviewed.  Patient left the clinic in stable condition.     Clinical History: Low back pain with right-sided radiculopathy  EXAM: MRI LUMBAR SPINE WITHOUT  CONTRAST  TECHNIQUE: Multiplanar, multisequence MR imaging of the lumbar spine was performed. No intravenous contrast was administered.  COMPARISON:  X-ray 03/25/2020  FINDINGS: Segmentation:  Standard.  Alignment:  3 mm anterolisthesis L5 on S1.  Vertebrae: No fracture, evidence of discitis, or bone lesion. Diffuse marrow heterogeneity without discrete marrow replacing lesion. Findings are nonspecific but can be seen in the setting of chronic anemia, smoking, and/or obesity.  Conus medullaris and cauda equina: Conus extends to the L1-2 level. Conus and cauda equina appear normal.  Paraspinal and other soft tissues: 1.2 cm rounded lesion within the mid to inferior pole of the right kidney which is low signal on T2 (series 12, image 12).  Disc levels:  T12-L1: Unremarkable.  L1-L2: Unremarkable.  L2-L3: Unremarkable.  L3-L4: Unremarkable.  L4-L5: Unremarkable disc. Mild bilateral facet hypertrophy. Mild bilateral subarticular recess stenosis. No foraminal or canal stenosis.  L5-S1: Grade 1 anterolisthesis with disc uncovering. Moderate bilateral facet arthropathy. Moderate bilateral subarticular recess stenosis without canal or foraminal stenosis.  IMPRESSION: 1. Moderate bilateral subarticular recess stenosis at L5-S1 and mild bilateral subarticular recess stenosis at L4-L5. 2. No canal stenosis at any level. 3. Incidental 1.2 cm rounded lesion within the mid to inferior pole of the right kidney which is low signal on T2. Findings are nonspecific but may reflect cyst with internal proteinaceous or hemorrhagic content. Solid lesion not excluded. Further evaluation with renal ultrasound is recommended. 4. Diffuse marrow heterogeneity without discrete marrow replacing lesion. Findings are nonspecific but can be seen in the setting of chronic anemia, smoking, and/or obesity.   Electronically Signed   By: Duanne Guess D.O.   On: 04/20/2020  17:40     Objective:  VS:  HT:    WT:   BMI:     BP:(!) 144/81  HR:73bpm  TEMP: ( )  RESP:  Physical Exam Constitutional:      General: She is not in acute distress.    Appearance: Normal appearance. She is not ill-appearing.  HENT:     Head: Normocephalic and atraumatic.     Right Ear: External ear normal.     Left Ear: External ear normal.  Eyes:     Extraocular Movements: Extraocular movements intact.  Cardiovascular:     Rate and Rhythm: Normal rate.     Pulses: Normal pulses.  Musculoskeletal:     Right lower leg: No edema.     Left lower leg: No edema.     Comments: Patient has good distal strength with no pain over the greater trochanters.  No clonus or focal weakness.  Skin:    Findings: No erythema, lesion or rash.  Neurological:     General: No focal deficit present.     Mental Status: She is alert and oriented to person, place, and time.     Sensory: No sensory deficit.     Motor: No weakness or abnormal muscle tone.     Coordination: Coordination normal.  Psychiatric:        Mood and Affect: Mood normal.        Behavior: Behavior normal.      Imaging: No results found.

## 2020-07-14 NOTE — Progress Notes (Signed)
Pt states right calf pain that travels up to the postier of the right thigh. Pt state walking and standing for a long period of time makes the pain worse. Pt state she has to sit down and rest to ease the pain. Pt has hx of inj on 06/04/20 pt state it was fine and she didn't feel any pain for a month.  Numeric Pain Rating Scale and Functional Assessment Average Pain 8   In the last MONTH (on 0-10 scale) has pain interfered with the following?  1. General activity like being  able to carry out your everyday physical activities such as walking, climbing stairs, carrying groceries, or moving a chair?  Rating(8)   +Driver, -BT, -Dye Allergies.

## 2020-07-15 ENCOUNTER — Telehealth: Payer: Self-pay

## 2020-07-15 NOTE — Telephone Encounter (Signed)
Pt has been getting injection for pain and pt has been taking ibuprofen in between the injections . Pt stated that there is a new ibuprofen with acetaminophen in it. Pt would like to know if this is safe to take with her other meds. Please advised. KH

## 2020-07-16 NOTE — Telephone Encounter (Signed)
yes

## 2020-07-17 NOTE — Telephone Encounter (Signed)
Pt was advised kh 

## 2020-07-21 ENCOUNTER — Telehealth: Payer: Self-pay | Admitting: Physical Medicine and Rehabilitation

## 2020-07-21 NOTE — Telephone Encounter (Signed)
Right S1 TF on 11/1. Please advise.

## 2020-07-21 NOTE — Telephone Encounter (Signed)
Patient called. Says she has not felt any relief from the injection. Would like a call back. Her call back number is [phone]

## 2020-07-21 NOTE — Telephone Encounter (Signed)
As prior note, facet block vs spine surgery referral, ? [facility]

## 2020-07-22 NOTE — Telephone Encounter (Signed)
Patient would like to try facet joint injection. Which levels/ side for auth?

## 2020-07-22 NOTE — Telephone Encounter (Signed)
Pt was approve #[id number]

## 2020-07-22 NOTE — Telephone Encounter (Signed)
Right vs bilateral TF esi

## 2020-07-22 NOTE — Telephone Encounter (Signed)
Called pt and sch 12/13. 

## 2020-07-22 NOTE — Telephone Encounter (Signed)
Needs auth and scheduling for bilateral L5-S1 facet injections.

## 2020-07-28 ENCOUNTER — Ambulatory Visit: Payer: Medicare Other | Admitting: Physical Medicine and Rehabilitation

## 2020-07-30 ENCOUNTER — Ambulatory Visit
Admission: RE | Admit: 2020-07-30 | Discharge: 2020-07-30 | Disposition: A | Discharge status: Home / Self Care | Payer: Medicare PPO | Source: Ambulatory Visit | Attending: Family Medicine | Admitting: Family Medicine

## 2020-07-30 DIAGNOSIS — N281 Cyst of kidney, acquired: Secondary | ICD-10-CM | POA: Diagnosis not present

## 2020-07-31 NOTE — Addendum Note (Signed)
Addended by: [name] on: 07/31/2020 08:28 AM   Modules accepted: Orders

## 2020-08-15 ENCOUNTER — Other Ambulatory Visit: Payer: Self-pay | Admitting: Family Medicine

## 2020-08-15 DIAGNOSIS — Z1231 Encounter for screening mammogram for malignant neoplasm of breast: Secondary | ICD-10-CM

## 2020-08-25 ENCOUNTER — Ambulatory Visit: Payer: Self-pay

## 2020-08-25 ENCOUNTER — Ambulatory Visit (INDEPENDENT_AMBULATORY_CARE_PROVIDER_SITE_OTHER): Payer: Medicare PPO | Admitting: Physical Medicine and Rehabilitation

## 2020-08-25 ENCOUNTER — Encounter: Payer: Self-pay | Admitting: Physical Medicine and Rehabilitation

## 2020-08-25 ENCOUNTER — Other Ambulatory Visit: Payer: Self-pay

## 2020-08-25 VITALS — BP 138/79 | HR 69

## 2020-08-25 DIAGNOSIS — M5416 Radiculopathy, lumbar region: Secondary | ICD-10-CM

## 2020-08-25 DIAGNOSIS — M47816 Spondylosis without myelopathy or radiculopathy, lumbar region: Secondary | ICD-10-CM | POA: Diagnosis not present

## 2020-08-25 MED ORDER — BETAMETHASONE SOD PHOS & ACET 6 (3-3) MG/ML IJ SUSP
12.0000 mg | Freq: Once | INTRAMUSCULAR | Status: AC
  Administered 2020-08-25: 12 mg

## 2020-08-25 NOTE — Progress Notes (Signed)
Pt state right leg pain. Pt state walking for a long period of time makes the worse. Pt state she has to sit down to help ease the pain.  Numeric Pain Rating Scale and Functional Assessment Average Pain 7   In the last MONTH (on 0-10 scale) has pain interfered with the following?  1. General activity like being  able to carry out your everyday physical activities such as walking, climbing stairs, carrying groceries, or moving a chair?  Rating(9)   +Driver, -BT, -Dye Allergies.

## 2020-09-26 ENCOUNTER — Ambulatory Visit: Payer: Medicare PPO

## 2020-10-23 NOTE — Progress Notes (Signed)
Danielle Yoder - 75 y.o. female MRN [id number]  Date of birth: Jan 17, 1946  Office Visit Note: Visit Date: 08/25/2020 PCP: Danielle Nian, MD Referred by: Danielle Nian, MD  Subjective: Chief Complaint  Patient presents with  . Right Leg - Pain   HPI:  Danielle Yoder is a 75 y.o. female who comes in today at the request of Dr. Aldean Yoder for planned Right L5-S1 Lumbar epidural steroid injection with fluoroscopic guidance.  The patient has failed conservative care including home exercise, medications, time and activity modification.  This injection will be diagnostic and hopefully therapeutic.  Please see requesting physician notes for further details and justification.  MRI reviewed with images and spine model.  MRI reviewed in the note below.    ROS Otherwise per HPI.  Assessment & Plan: Visit Diagnoses:    ICD-10-CM   1. Lumbar radiculopathy  M54.16 Epidural Steroid injection  2. Spondylosis without myelopathy or radiculopathy, lumbar region  M47.816 XR C-ARM NO REPORT    betamethasone acetate-betamethasone sodium phosphate (CELESTONE) injection 12 mg    CANCELED: Facet Injection    Plan: No additional findings.   Meds & Orders:  Meds ordered this encounter  Medications  . betamethasone acetate-betamethasone sodium phosphate (CELESTONE) injection 12 mg    Orders Placed This Encounter  Procedures  . XR C-ARM NO REPORT  . Epidural Steroid injection    Follow-up: Return if symptoms worsen or fail to improve.   Procedures: No procedures performed  Lumbar Epidural Steroid Injection - Interlaminar Approach with Fluoroscopic Guidance  Patient: Danielle Yoder      Date of Birth: 01/08/1946 MRN: [id number] PCP: Danielle Nian, MD      Visit Date: 08/25/2020   Universal Protocol:     Consent Given By: the patient  Position: PRONE  Additional Comments: Vital signs were monitored before and after the procedure. Patient was prepped and draped in the  usual sterile fashion. The correct patient, procedure, and site was verified.   Injection Procedure Details:   Procedure diagnoses: Lumbar radiculopathy [M54.16]   Meds Administered:  Meds ordered this encounter  Medications  . betamethasone acetate-betamethasone sodium phosphate (CELESTONE) injection 12 mg     Laterality: Right  Location/Site:  L5-S1  Needle: 3.5 in., 20 ga. Tuohy  Needle Placement: Paramedian epidural  Findings:   -Comments: Excellent flow of contrast into the epidural space.  Procedure Details: Using a paramedian approach from the side mentioned above, the region overlying the inferior lamina was localized under fluoroscopic visualization and the soft tissues overlying this structure were infiltrated with 4 ml. of 1% Lidocaine without Epinephrine. The Tuohy needle was inserted into the epidural space using a paramedian approach.   The epidural space was localized using loss of resistance along with counter oblique bi-planar fluoroscopic views.  After negative aspirate for air, blood, and CSF, a 2 ml. volume of Isovue-250 was injected into the epidural space and the flow of contrast was observed. Radiographs were obtained for documentation purposes.    The injectate was administered into the level noted above.   Additional Comments:  The patient tolerated the procedure well Dressing: 2 x 2 sterile gauze and Band-Aid    Post-procedure details: Patient was observed during the procedure. Post-procedure instructions were reviewed.  Patient left the clinic in stable condition.      Clinical History: Low back pain with right-sided radiculopathy  EXAM: MRI LUMBAR SPINE WITHOUT CONTRAST  TECHNIQUE: Multiplanar, multisequence MR imaging of the lumbar  spine was performed. No intravenous contrast was administered.  COMPARISON:  X-ray 03/25/2020  FINDINGS: Segmentation:  Standard.  Alignment:  3 mm anterolisthesis L5 on S1.  Vertebrae: No  fracture, evidence of discitis, or bone lesion. Diffuse marrow heterogeneity without discrete marrow replacing lesion. Findings are nonspecific but can be seen in the setting of chronic anemia, smoking, and/or obesity.  Conus medullaris and cauda equina: Conus extends to the L1-2 level. Conus and cauda equina appear normal.  Paraspinal and other soft tissues: 1.2 cm rounded lesion within the mid to inferior pole of the right kidney which is low signal on T2 (series 12, image 12).  Disc levels:  T12-L1: Unremarkable.  L1-L2: Unremarkable.  L2-L3: Unremarkable.  L3-L4: Unremarkable.  L4-L5: Unremarkable disc. Mild bilateral facet hypertrophy. Mild bilateral subarticular recess stenosis. No foraminal or canal stenosis.  L5-S1: Grade 1 anterolisthesis with disc uncovering. Moderate bilateral facet arthropathy. Moderate bilateral subarticular recess stenosis without canal or foraminal stenosis.  IMPRESSION: 1. Moderate bilateral subarticular recess stenosis at L5-S1 and mild bilateral subarticular recess stenosis at L4-L5. 2. No canal stenosis at any level. 3. Incidental 1.2 cm rounded lesion within the mid to inferior pole of the right kidney which is low signal on T2. Findings are nonspecific but may reflect cyst with internal proteinaceous or hemorrhagic content. Solid lesion not excluded. Further evaluation with renal ultrasound is recommended. 4. Diffuse marrow heterogeneity without discrete marrow replacing lesion. Findings are nonspecific but can be seen in the setting of chronic anemia, smoking, and/or obesity.   Electronically Signed   By: Duanne Guess D.O.   On: 04/20/2020 17:40     Objective:  VS:  HT:    WT:   BMI:     BP:138/79  HR:69bpm  TEMP: ( )  RESP:  Physical Exam Vitals and nursing note reviewed.  Constitutional:      General: She is not in acute distress.    Appearance: Normal appearance. She is not ill-appearing.  HENT:      Head: Normocephalic and atraumatic.     Right Ear: External ear normal.     Left Ear: External ear normal.  Eyes:     Extraocular Movements: Extraocular movements intact.  Cardiovascular:     Rate and Rhythm: Normal rate.     Pulses: Normal pulses.  Pulmonary:     Effort: Pulmonary effort is normal. No respiratory distress.  Abdominal:     General: There is no distension.     Palpations: Abdomen is soft.  Musculoskeletal:        General: Tenderness present.     Cervical back: Neck supple.     Right lower leg: No edema.     Left lower leg: No edema.     Comments: Patient has good distal strength with no pain over the greater trochanters.  No clonus or focal weakness.  Skin:    Findings: No erythema, lesion or rash.  Neurological:     General: No focal deficit present.     Mental Status: She is alert and oriented to person, place, and time.     Sensory: No sensory deficit.     Motor: No weakness or abnormal muscle tone.     Coordination: Coordination normal.  Psychiatric:        Mood and Affect: Mood normal.        Behavior: Behavior normal.      Imaging: No results found.

## 2020-10-23 NOTE — Procedures (Signed)
Lumbar Epidural Steroid Injection - Interlaminar Approach with Fluoroscopic Guidance  Patient: Danielle Yoder      Date of Birth: Oct 11, 1945 MRN: [id number] PCP: [name], MD      Visit Date: 08/25/2020   Universal Protocol:     Consent Given By: the patient  Position: PRONE  Additional Comments: Vital signs were monitored before and after the procedure. Patient was prepped and draped in the usual sterile fashion. The correct patient, procedure, and site was verified.   Injection Procedure Details:   Procedure diagnoses: Lumbar radiculopathy [M54.16]   Meds Administered:  Meds ordered this encounter  Medications  . betamethasone acetate-betamethasone sodium phosphate (CELESTONE) injection 12 mg     Laterality: Right  Location/Site:  L5-S1  Needle: 3.5 in., 20 ga. Tuohy  Needle Placement: Paramedian epidural  Findings:   -Comments: Excellent flow of contrast into the epidural space.  Procedure Details: Using a paramedian approach from the side mentioned above, the region overlying the inferior lamina was localized under fluoroscopic visualization and the soft tissues overlying this structure were infiltrated with 4 ml. of 1% Lidocaine without Epinephrine. The Tuohy needle was inserted into the epidural space using a paramedian approach.   The epidural space was localized using loss of resistance along with counter oblique bi-planar fluoroscopic views.  After negative aspirate for air, blood, and CSF, a 2 ml. volume of Isovue-250 was injected into the epidural space and the flow of contrast was observed. Radiographs were obtained for documentation purposes.    The injectate was administered into the level noted above.   Additional Comments:  The patient tolerated the procedure well Dressing: 2 x 2 sterile gauze and Band-Aid    Post-procedure details: Patient was observed during the procedure. Post-procedure instructions were reviewed.  Patient left the  clinic in stable condition.

## 2020-11-06 ENCOUNTER — Other Ambulatory Visit: Payer: Self-pay

## 2020-11-06 ENCOUNTER — Ambulatory Visit
Admission: RE | Admit: 2020-11-06 | Discharge: 2020-11-06 | Disposition: A | Discharge status: Home / Self Care | Payer: Medicare PPO | Source: Ambulatory Visit | Attending: Family Medicine | Admitting: Family Medicine

## 2020-11-06 DIAGNOSIS — Z1231 Encounter for screening mammogram for malignant neoplasm of breast: Secondary | ICD-10-CM | POA: Diagnosis not present

## 2020-12-24 ENCOUNTER — Other Ambulatory Visit: Payer: Self-pay

## 2020-12-24 ENCOUNTER — Ambulatory Visit (INDEPENDENT_AMBULATORY_CARE_PROVIDER_SITE_OTHER): Payer: Medicare PPO

## 2020-12-24 DIAGNOSIS — Z23 Encounter for immunization: Secondary | ICD-10-CM | POA: Diagnosis not present

## 2021-02-02 ENCOUNTER — Encounter: Payer: Self-pay | Admitting: Family Medicine

## 2021-02-02 ENCOUNTER — Other Ambulatory Visit: Payer: Self-pay

## 2021-02-02 ENCOUNTER — Ambulatory Visit: Payer: Medicare PPO | Admitting: Family Medicine

## 2021-02-02 VITALS — BP 124/80 | HR 58 | Temp 97.0°F | Ht 60.0 in | Wt 137.8 lb

## 2021-02-02 DIAGNOSIS — Z8249 Family history of ischemic heart disease and other diseases of the circulatory system: Secondary | ICD-10-CM | POA: Diagnosis not present

## 2021-02-02 DIAGNOSIS — E785 Hyperlipidemia, unspecified: Secondary | ICD-10-CM | POA: Diagnosis not present

## 2021-02-02 DIAGNOSIS — Z0000 Encounter for general adult medical examination without abnormal findings: Secondary | ICD-10-CM | POA: Diagnosis not present

## 2021-02-02 DIAGNOSIS — I10 Essential (primary) hypertension: Secondary | ICD-10-CM | POA: Diagnosis not present

## 2021-02-02 DIAGNOSIS — J301 Allergic rhinitis due to pollen: Secondary | ICD-10-CM

## 2021-02-02 DIAGNOSIS — Z86010 Personal history of colonic polyps: Secondary | ICD-10-CM

## 2021-02-02 DIAGNOSIS — M8580 Other specified disorders of bone density and structure, unspecified site: Secondary | ICD-10-CM | POA: Diagnosis not present

## 2021-02-02 MED ORDER — ATORVASTATIN CALCIUM 40 MG PO TABS: 40.0000 mg | ORAL_TABLET | Freq: Every day | ORAL | 3 refills | Status: DC

## 2021-02-02 MED ORDER — HYDROCHLOROTHIAZIDE 12.5 MG PO CAPS: 12.5000 mg | ORAL_CAPSULE | Freq: Every day | ORAL | 3 refills | Status: DC

## 2021-02-02 MED ORDER — VERAPAMIL HCL ER 240 MG PO TBCR: 240.0000 mg | EXTENDED_RELEASE_TABLET | Freq: Every day | ORAL | 3 refills | Status: DC

## 2021-02-02 NOTE — Patient Instructions (Signed)
  Ms. Kincannon , Thank you for taking time to come for your Medicare Wellness Visit. I appreciate your ongoing commitment to your health goals. Please review the following plan we discussed and let me know if I can assist you in the future.   These are the goals we discussed: Goals   None     This is a list of the screening recommended for you and due dates:  Health Maintenance  Topic Date Due  . Flu Shot  04/13/2021  . Cologuard (Stool DNA test)  05/09/2022  . Mammogram  11/06/2022  . Tetanus Vaccine  02/01/2030  . DEXA scan (bone density measurement)  Completed  . COVID-19 Vaccine  Completed  . Hepatitis C Screening: USPSTF Recommendation to screen - Ages 71-79 yo.  Completed  . Pneumonia vaccines  Completed  . HPV Vaccine  Aged Out

## 2021-02-02 NOTE — Progress Notes (Signed)
Danielle Yoder is a 75 y.o. female who presents for annual wellness visit and follow-up on chronic medical conditions.  She has no particular concerns or complaints.  She continues on atorvastatin, HCTZ and verapamil and is having no difficulties with them.  She does have a history of osteopenia and does use vitamin D and calcium.  Her allergies are under good control.  She does have a history of colonic polyps but they were hyperplastic.  She will be due for another Cologuard within the next year or 2.  Otherwise family and social history is unremarkable.  Immunizations and Health Maintenance Immunization History  Administered Date(s) Administered  . Fluad Quad(high Dose 65+) 07/01/2020  . Influenza Split 05/31/2011, 09/26/2012  . Influenza, High Dose Seasonal PF 09/18/2013, 05/17/2017  . Influenza-Unspecified 09/24/2016  . PFIZER Comirnaty(Gray Top)Covid-19 Tri-Sucrose Vaccine 12/24/2020  . PFIZER(Purple Top)SARS-COV-2 Vaccination 10/04/2019, 10/25/2019, 06/03/2020  . Pneumococcal Conjugate-13 03/25/2016  . Pneumococcal Polysaccharide-23 04/30/2009, 01/07/2014  . Tdap 03/13/2010, 02/02/2020  . Zoster 05/01/2008  . Zoster Recombinat (Shingrix) 01/12/2018, 04/24/2018   There are no preventive care reminders to display for this patient.  Last Pap smear: aged out  Last mammogram: 11/06/20 Last colonoscopy: 12/22/05 cologuard: 04/17/19 Last DEXA: 04/17/20 Dentist: Q six months Ophtho: N/A Exercise: walking five days a week  Other doctors caring for patient include: Dr. Alvester Morin ortho  Advanced directives: Does Patient Have a Medical Advance Directive?: Yes Type of Advance Directive: Living will Does patient want to make changes to medical advance directive?: No - Patient declined  Depression screen:  See questionnaire below.  Depression screen [facility] 2/9 02/02/2021 03/19/2020 03/04/2020 01/31/2020 01/30/2019  Decreased Interest 0 0 0 0 0  Down, Depressed, Hopeless 0 0 0 0 0  PHQ - 2 Score 0 0  0 0 0    Fall Risk Screen: see questionnaire below. Fall Risk  02/02/2021 03/04/2020 01/31/2020 01/30/2019 12/13/2017  Falls in the past year? 0 0 0 0 No  Number falls in past yr: 0 - - - -  Injury with Fall? 0 - - - -  Risk for fall due to : No Fall Risks - - - -  Follow up Falls evaluation completed - - - -    ADL screen:  See questionnaire below Functional Status Survey: Is the patient deaf or have difficulty hearing?: No Does the patient have difficulty seeing, even when wearing glasses/contacts?: No Does the patient have difficulty concentrating, remembering, or making decisions?: No Does the patient have difficulty walking or climbing stairs?: No Does the patient have difficulty dressing or bathing?: No Does the patient have difficulty doing errands alone such as visiting a doctor's office or shopping?: No   Review of Systems Constitutional: -, -unexpected weight change, -anorexia, -fatigue Allergy: -sneezing, -itching, -congestion Dermatology: denies changing moles, rash, lumps ENT: -runny nose, -ear pain, -sore throat,  Cardiology:  -chest pain, -palpitations, -orthopnea, Respiratory: -cough, -shortness of breath, -dyspnea on exertion, -wheezing,  Gastroenterology: -abdominal pain, -nausea, -vomiting, -diarrhea, -constipation, -dysphagia Hematology: -bleeding or bruising problems Musculoskeletal: -arthralgias, -myalgias, -joint swelling, -back pain, - Ophthalmology: -vision changes,  Urology: -dysuria, -difficulty urinating,  -urinary frequency, -urgency, incontinence Neurology: -, -numbness, , -memory loss, -falls, -dizziness Still visible wheezing albuterol amount of deltoid relief okay  PHYSICAL EXAM:  General Appearance: Alert, cooperative, no distress, appears stated age Head: Normocephalic, without obvious abnormality, atraumatic Eyes: PERRL, conjunctiva/corneas clear, EOM's intact,  Ears: Normal TM's and external ear canals Nose: Nares normal, mucosa normal, no  drainage or sinus  tenderness Throat: Lips, mucosa, and tongue normal; teeth and gums normal Neck: Supple, no lymphadenopathy;  thyroid:  no enlargement/tenderness/nodules; no carotid bruit or JVD Lungs: Clear to auscultation bilaterally without wheezes, rales or ronchi; respirations unlabored Heart: Regular rate and rhythm, S1 and S2 normal, no murmur, rubor gallop Abdomen: Soft, non-tender, nondistended, normoactive bowel sounds,  no masses, no hepatosplenomegaly Extremities: No clubbing, cyanosis or edema Pulses: 2+ and symmetric all extremities Skin:  Skin color, texture, turgor normal, no rashes or lesions Lymph nodes: Cervical, supraclavicular, and axillary nodes normal Neurologic:  CNII-XII intact, normal strength, sensation and gait; reflexes 2+ and symmetric throughout Psych: Normal mood, affect, hygiene and grooming.  ASSESSMENT/PLAN: Routine general medical examination at a health care facility - Plan: CBC with Differential/Platelet, Comprehensive metabolic panel, Lipid panel  Osteopenia, unspecified location  Essential hypertension - Plan: CBC with Differential/Platelet, Comprehensive metabolic panel, hydrochlorothiazide (MICROZIDE) 12.5 MG capsule, verapamil (CALAN-SR) 240 MG CR tablet  Hyperlipidemia with target LDL less than 100 - Plan: Lipid panel, atorvastatin (LIPITOR) 40 MG tablet  Family history of heart disease in female family member before age 74  Non-seasonal allergic rhinitis due to pollen  History of colonic polyps - Hyperplastic    Discussed monthly self breast exams and yearly mammograms, and the fact that we can potentially stop the next year.  At least 30 minutes of aerobic activity at least 5 days/week and weight-bearing exercise 2x/week; proper sunscreen use reviewed; healthy diet, including goals of calcium and vitamin D intake  regular seatbelt use; changing batteries in smoke detectors.  Immunization recommendations discussed.  Colonoscopy  recommendations reviewed   Medicare Attestation I have personally reviewed: The patient's medical and social history Their use of alcohol, tobacco or illicit drugs Their current medications and supplements The patient's functional ability including ADLs,fall risks, home safety risks, cognitive, and hearing and visual impairment Diet and physical activities Evidence for depression or mood disorders  The patient's weight, height, and BMI have been recorded in the chart.  I have made referrals, counseling, and provided education to the patient based on review of the above and I have provided the patient with a written personalized care plan for preventive services.     Sharlot Gowda, MD   02/02/2021

## 2021-02-03 LAB — CBC WITH DIFFERENTIAL/PLATELET
Basophils Absolute: 0 10*3/uL (ref 0.0–0.2)
Basos: 1 %
EOS (ABSOLUTE): 0.7 10*3/uL — ABNORMAL HIGH (ref 0.0–0.4)
Eos: 9 %
Hematocrit: 41.9 % (ref 34.0–46.6)
Hemoglobin: 13.6 g/dL (ref 11.1–15.9)
Immature Grans (Abs): 0 10*3/uL (ref 0.0–0.1)
Immature Granulocytes: 0 %
Lymphocytes Absolute: 2.5 10*3/uL (ref 0.7–3.1)
Lymphs: 34 %
MCH: 27 pg (ref 26.6–33.0)
MCHC: 32.5 g/dL (ref 31.5–35.7)
MCV: 83 fL (ref 79–97)
Monocytes Absolute: 0.8 10*3/uL (ref 0.1–0.9)
Monocytes: 10 %
Neutrophils Absolute: 3.5 10*3/uL (ref 1.4–7.0)
Neutrophils: 46 %
Platelets: 227 10*3/uL (ref 150–450)
RBC: 5.04 x10E6/uL (ref 3.77–5.28)
RDW: 12.8 % (ref 11.7–15.4)
WBC: 7.5 10*3/uL (ref 3.4–10.8)

## 2021-02-03 LAB — LIPID PANEL
Chol/HDL Ratio: 3.7 ratio (ref 0.0–4.4)
Cholesterol, Total: 169 mg/dL (ref 100–199)
HDL: 46 mg/dL (ref >39)
LDL Chol Calc (NIH): 101 mg/dL — ABNORMAL HIGH (ref 0–99)
Triglycerides: 124 mg/dL (ref 0–149)
VLDL Cholesterol Cal: 22 mg/dL (ref 5–40)

## 2021-02-03 LAB — COMPREHENSIVE METABOLIC PANEL
ALT: 16 IU/L (ref 0–32)
AST: 19 IU/L (ref 0–40)
Albumin/Globulin Ratio: 1.9 (ref 1.2–2.2)
Albumin: 4.6 g/dL (ref 3.7–4.7)
Alkaline Phosphatase: 75 IU/L (ref 44–121)
BUN/Creatinine Ratio: 21 (ref 12–28)
BUN: 18 mg/dL (ref 8–27)
Bilirubin Total: 0.5 mg/dL (ref 0.0–1.2)
CO2: 25 mmol/L (ref 20–29)
Calcium: 11 mg/dL — ABNORMAL HIGH (ref 8.7–10.3)
Chloride: 103 mmol/L (ref 96–106)
Creatinine, Ser: 0.84 mg/dL (ref 0.57–1.00)
Globulin, Total: 2.4 g/dL (ref 1.5–4.5)
Glucose: 90 mg/dL (ref 65–99)
Potassium: 4.7 mmol/L (ref 3.5–5.2)
Sodium: 143 mmol/L (ref 134–144)
Total Protein: 7 g/dL (ref 6.0–8.5)
eGFR: 73 mL/min/{1.73_m2} (ref >59)

## 2021-03-18 ENCOUNTER — Other Ambulatory Visit: Payer: Self-pay

## 2021-03-18 ENCOUNTER — Ambulatory Visit: Payer: Medicare PPO | Admitting: Medical

## 2021-03-18 VITALS — BP 140/70 | HR 60 | Temp 97.3°F | Wt 138.0 lb

## 2021-03-18 DIAGNOSIS — I10 Essential (primary) hypertension: Secondary | ICD-10-CM | POA: Diagnosis not present

## 2021-03-18 DIAGNOSIS — H43391 Other vitreous opacities, right eye: Secondary | ICD-10-CM | POA: Diagnosis not present

## 2021-03-18 DIAGNOSIS — E785 Hyperlipidemia, unspecified: Secondary | ICD-10-CM | POA: Diagnosis not present

## 2021-03-18 NOTE — Progress Notes (Signed)
Subjective:  Danielle Yoder is a 75 y.o. female who presents for Chief Complaint  Patient presents with   Flashes/floaters    Floaters in right eye for a week. Went to eye doctor yesterday but didn't have floaters addressed. Got a note from the doctor to call [facility] to follow-up for floaters     Here for c/o floaters in right eye x about a week.   She notes maybe having this years ago, but just started back.   Saw eye doctor yesterday, [facility], they did some tests and virtual visit with doctor.  And by the end of the visit, they didn't really give her any medication, just tried to get her to buy glasses.      No eye pain, no haziness, no cloudy vision.  No vision loss.  Otherwise in normal state of health.   No other aggravating or relieving factors.    No other c/o.  Past Medical History:  Diagnosis Date   Allergy    RHINITIS   Arthritis    Colonic polyp    Dyslipidemia    Hemorrhoids    Hypertension    Current Outpatient Medications on File Prior to Visit  Medication Sig Dispense Refill   atorvastatin (LIPITOR) 40 MG tablet Take 1 tablet (40 mg total) by mouth daily. 90 tablet 3   Calcium-Vitamin D (CALTRATE 600 PLUS-VIT D PO) Take by mouth.     hydrochlorothiazide (MICROZIDE) 12.5 MG capsule Take 1 capsule (12.5 mg total) by mouth daily. 90 capsule 3   ibuprofen (ADVIL,MOTRIN) 200 MG tablet Take 400 mg by mouth every 6 (six) hours as needed.     verapamil (CALAN-SR) 240 MG CR tablet Take 1 tablet (240 mg total) by mouth daily. 90 tablet 3   No current facility-administered medications on file prior to visit.     The following portions of the patient's history were reviewed and updated as appropriate: allergies, current medications, past family history, past medical history, past social history, past surgical history and problem list.  ROS Otherwise as in subjective above    Objective: BP 140/70   Pulse 60   Temp (!) 97.3 F (36.3 C)   Wt  138 lb (62.6 kg)   SpO2 98%   BMI 26.95 kg/m   BP Readings from Last 3 Encounters:  03/18/21 140/70  02/02/21 124/80  08/25/20 138/79    General appearance: alert, no distress, well developed, well nourished HEENT: normocephalic, sclerae anicteric, conjunctiva pink and moist Right eye visual field seems decreased in upper outer field, but acuity 20/40 bilat without correction PERRLA, EOMi    Assessment: Encounter Diagnoses  Name Primary?   Floaters in visual field, right Yes   Primary hypertension    Hyperlipidemia with target LDL less than 100      Plan: Discussed concerns, symptoms.  We will refer to ophthalmology.  Advised if pain, cloudy vision, blurred vision or other new changes , get evaluated immediately.  Otherwise she is not diabetes, reviewed 01/2021 labs, BP controlled although slightly elevated today.      Danielle Yoder was seen today for flashes/floaters.  Diagnoses and all orders for this visit:  Floaters in visual field, right -     Ambulatory referral to Ophthalmology  Primary hypertension  Hyperlipidemia with target LDL less than 100   Follow up: pending referral

## 2021-03-19 DIAGNOSIS — H43391 Other vitreous opacities, right eye: Secondary | ICD-10-CM | POA: Diagnosis not present

## 2021-03-19 DIAGNOSIS — H43811 Vitreous degeneration, right eye: Secondary | ICD-10-CM | POA: Diagnosis not present

## 2021-03-19 DIAGNOSIS — H25813 Combined forms of age-related cataract, bilateral: Secondary | ICD-10-CM | POA: Diagnosis not present

## 2021-04-21 ENCOUNTER — Other Ambulatory Visit: Payer: Self-pay

## 2021-04-21 ENCOUNTER — Ambulatory Visit: Payer: Medicare PPO | Admitting: Family Medicine

## 2021-04-21 ENCOUNTER — Encounter: Payer: Self-pay | Admitting: Family Medicine

## 2021-04-21 VITALS — BP 110/72 | HR 57 | Temp 96.5°F | Wt 138.4 lb

## 2021-04-21 DIAGNOSIS — L0291 Cutaneous abscess, unspecified: Secondary | ICD-10-CM

## 2021-04-21 NOTE — Progress Notes (Signed)
   Subjective:    Patient ID: Danielle Yoder, female    DOB: Apr 19, 1946, 75 y.o.   MRN: [id number]  HPI She complains of some slight swelling and discomfort in vaginal area mainly on the right.  She states that it is slightly tender to palpation but has not seen any drainage.  No discharge from the vaginal area.   Review of Systems     Objective:   Physical Exam Vaginal exam does show a 0.5 to 1/2 cm raised slightly whitish lesion that is slightly tender to palpation on the right labia.  No induration noted.       Assessment & Plan:  Abscess I explained that this seems to be very superficial and surgical intervention at this point would not be necessary.  Recommend warm soaks to the area and let it calm down on its own.  She was comfortable with that.

## 2021-09-25 ENCOUNTER — Other Ambulatory Visit: Payer: Self-pay | Admitting: Family Medicine

## 2021-09-25 DIAGNOSIS — Z1231 Encounter for screening mammogram for malignant neoplasm of breast: Secondary | ICD-10-CM

## 2021-10-13 ENCOUNTER — Telehealth: Payer: Medicare PPO | Admitting: Family Medicine

## 2021-10-13 ENCOUNTER — Encounter: Payer: Self-pay | Admitting: Family Medicine

## 2021-10-13 ENCOUNTER — Other Ambulatory Visit: Payer: Self-pay

## 2021-10-13 VITALS — Temp 98.3°F | Wt 138.0 lb

## 2021-10-13 DIAGNOSIS — U071 COVID-19: Secondary | ICD-10-CM

## 2021-10-13 DIAGNOSIS — Z23 Encounter for immunization: Secondary | ICD-10-CM

## 2021-10-13 NOTE — Progress Notes (Signed)
° °  Subjective:    Patient ID: Danielle Yoder, female    DOB: [birth year], 76 y.o.   MRN: [id number]  HPI Documentation for virtual audio and video telecommunications through Caregility encounter: The patient was located at home. 2 patient identifiers used.  The provider was located in the office. The patient did consent to this visit and is aware of possible charges through their insurance for this visit. The other persons participating in this telemedicine service were none. Time spent on call was 5 minutes and in review of previous records >20 minutes total for counseling and coordination of care. This virtual service is not related to other E/M service within previous 7 days.  She states that on Sunday she developed a slight cough and sore throat with rhinorrhea that has continued as well as developing some fatigue and a slight headache.  Today she tested positive is still having difficulty with cough, fatigue and slight sore throat.  She has had 3 COVID vaccines.  She has not lost her smell or taste and has had no difficulty with shortness of breath.  Review of Systems     Objective:   Physical Exam Alert and in no distress and does not appear toxic.       Assessment & Plan:  Need for COVID-19 vaccine I explained that we have a 5-day window to monitor this.  We have through Friday to monitor this and discussed worsening of cough, fever and shortness of breath is the the big issues we need to deal with.  She will keep in touch with [place] over the next several days.  Also discussed the fact that she is to stay sequestered until Friday and after that as long as she is feeling better she can wear a mask.  Explained that at this point do not think she needs to be given an antiviral and she was comfortable with that.

## 2021-10-14 ENCOUNTER — Telehealth: Payer: Self-pay

## 2021-10-14 ENCOUNTER — Encounter: Payer: Self-pay | Admitting: Family Medicine

## 2021-10-14 NOTE — Telephone Encounter (Signed)
Pt advised she is doing fine. She slept well and she will call back if anything changes. KH

## 2021-11-09 ENCOUNTER — Other Ambulatory Visit: Payer: Self-pay

## 2021-11-09 ENCOUNTER — Ambulatory Visit
Admission: RE | Admit: 2021-11-09 | Discharge: 2021-11-09 | Disposition: A | Discharge status: Home / Self Care | Payer: Medicare PPO | Source: Ambulatory Visit | Attending: Family Medicine | Admitting: Family Medicine

## 2021-11-09 DIAGNOSIS — Z1231 Encounter for screening mammogram for malignant neoplasm of breast: Secondary | ICD-10-CM | POA: Diagnosis not present

## 2021-11-14 ENCOUNTER — Emergency Department (HOSPITAL_BASED_OUTPATIENT_CLINIC_OR_DEPARTMENT_OTHER)
Admission: EM | Admit: 2021-11-14 | Discharge: 2021-11-14 | Disposition: A | Discharge status: Home / Self Care | Payer: Medicare PPO | Attending: Emergency Medicine | Admitting: Emergency Medicine

## 2021-11-14 ENCOUNTER — Emergency Department (HOSPITAL_BASED_OUTPATIENT_CLINIC_OR_DEPARTMENT_OTHER): Payer: Medicare PPO

## 2021-11-14 ENCOUNTER — Other Ambulatory Visit: Payer: Self-pay

## 2021-11-14 ENCOUNTER — Encounter (HOSPITAL_BASED_OUTPATIENT_CLINIC_OR_DEPARTMENT_OTHER): Payer: Self-pay

## 2021-11-14 DIAGNOSIS — Z23 Encounter for immunization: Secondary | ICD-10-CM | POA: Diagnosis not present

## 2021-11-14 DIAGNOSIS — Y9302 Activity, running: Secondary | ICD-10-CM | POA: Diagnosis not present

## 2021-11-14 DIAGNOSIS — G9389 Other specified disorders of brain: Secondary | ICD-10-CM | POA: Diagnosis not present

## 2021-11-14 DIAGNOSIS — S0990XA Unspecified injury of head, initial encounter: Secondary | ICD-10-CM | POA: Diagnosis not present

## 2021-11-14 DIAGNOSIS — S0993XA Unspecified injury of face, initial encounter: Secondary | ICD-10-CM | POA: Diagnosis not present

## 2021-11-14 DIAGNOSIS — Z79899 Other long term (current) drug therapy: Secondary | ICD-10-CM | POA: Insufficient documentation

## 2021-11-14 DIAGNOSIS — S300XXA Contusion of lower back and pelvis, initial encounter: Secondary | ICD-10-CM | POA: Diagnosis not present

## 2021-11-14 DIAGNOSIS — W01198A Fall on same level from slipping, tripping and stumbling with subsequent striking against other object, initial encounter: Secondary | ICD-10-CM | POA: Diagnosis not present

## 2021-11-14 DIAGNOSIS — I10 Essential (primary) hypertension: Secondary | ICD-10-CM | POA: Insufficient documentation

## 2021-11-14 DIAGNOSIS — M533 Sacrococcygeal disorders, not elsewhere classified: Secondary | ICD-10-CM | POA: Diagnosis not present

## 2021-11-14 DIAGNOSIS — R58 Hemorrhage, not elsewhere classified: Secondary | ICD-10-CM | POA: Diagnosis not present

## 2021-11-14 DIAGNOSIS — W19XXXA Unspecified fall, initial encounter: Secondary | ICD-10-CM

## 2021-11-14 DIAGNOSIS — S0191XA Laceration without foreign body of unspecified part of head, initial encounter: Secondary | ICD-10-CM | POA: Insufficient documentation

## 2021-11-14 DIAGNOSIS — G238 Other specified degenerative diseases of basal ganglia: Secondary | ICD-10-CM | POA: Diagnosis not present

## 2021-11-14 DIAGNOSIS — R102 Pelvic and perineal pain: Secondary | ICD-10-CM | POA: Diagnosis not present

## 2021-11-14 DIAGNOSIS — M545 Low back pain, unspecified: Secondary | ICD-10-CM | POA: Diagnosis not present

## 2021-11-14 DIAGNOSIS — Y92410 Unspecified street and highway as the place of occurrence of the external cause: Secondary | ICD-10-CM | POA: Diagnosis not present

## 2021-11-14 DIAGNOSIS — S0003XA Contusion of scalp, initial encounter: Secondary | ICD-10-CM | POA: Diagnosis not present

## 2021-11-14 DIAGNOSIS — T148XXA Other injury of unspecified body region, initial encounter: Secondary | ICD-10-CM

## 2021-11-14 MED ORDER — TETANUS-DIPHTH-ACELL PERTUSSIS 5-2.5-18.5 LF-MCG/0.5 IM SUSY
0.5000 mL | PREFILLED_SYRINGE | Freq: Once | INTRAMUSCULAR | Status: AC
  Administered 2021-11-14: 0.5 mL via INTRAMUSCULAR
  Filled 2021-11-14: qty 0.5

## 2021-11-14 MED ORDER — ACETAMINOPHEN 500 MG PO TABS
1000.0000 mg | ORAL_TABLET | Freq: Four times a day (QID) | ORAL | Status: DC | PRN
  Administered 2021-11-14: 1000 mg via ORAL
  Filled 2021-11-14: qty 2

## 2021-11-14 NOTE — ED Triage Notes (Signed)
Pt arrives with PTAR with reports of running from a dog today, states the dog did not attack her or push her down but she tripped running and hit the back of her head. Initially had some bleeding, non at this time per PTAR. No blood thinners. No LOC.  ?

## 2021-11-14 NOTE — Discharge Instructions (Addendum)
You may take acetaminophen (tylenol) 1000 mg 4 times a day for 1 week. This is the maximum dose of Tylenol (acetaminophen) you can take from all sources. Please check other over-the-counter medications and prescriptions to ensure you are not taking other medications that contain acetaminophen.  You may also take ibuprofen 400 mg 6 times a day alternating with or at the same time as tylenol OR ibpurofen 600mg  four times a day. You can also use a lidocaine patch over the area which can be purchased over the counter.  ?

## 2021-11-14 NOTE — ED Provider Notes (Signed)
[facility] EMERGENCY DEPARTMENT Provider Note   CSN: [id number] Arrival date & time: 11/14/21  1032     History  Chief Complaint  Patient presents with   Marletta Lor    Danielle Yoder is a 76 y.o. female.  HPI     76 year old female with history of hypertension presents with concern for fall.  She was out walking when a dog came up to her like he was going to attack, bearing his teeth and lunging at her.  In an effort to get away, she was stepping and stepped off of the curb falling into the street.    She fell backwards onto her buttock, back and hit the back of her head. Has bleeding from back of head.  Last tdap 2011.  No numbness, weakness, difficulty walking, loss of control of bowel or bladder.   Has severe pain to pelvis/tailbone. Worse when trying to sit.  No nausea, vomiting, dyspnea, chest pain.  Declines pain medications.   She is not on anticoagulation.   Past Medical History:  Diagnosis Date   Allergy    RHINITIS   Arthritis    Colonic polyp    Dyslipidemia    Hemorrhoids    Hypertension     Past Surgical History:  Procedure Laterality Date   FOOT SURGERY Right    bone shaved off; 2 surgeries (recurred 8 yrs later)   FOOT SURGERY Left    bone shaved off ; 2 surgeries (recurred)     Home Medications Prior to Admission medications   Medication Sig Start Date End Date Taking? Authorizing Provider  atorvastatin (LIPITOR) 40 MG tablet Take 1 tablet (40 mg total) by mouth daily. 02/02/21   [name], MD  Calcium-Vitamin D (CALTRATE 600 PLUS-VIT D PO) Take by mouth.    [provider]  hydrochlorothiazide (MICROZIDE) 12.5 MG capsule Take 1 capsule (12.5 mg total) by mouth daily. 02/02/21   [name], MD  ibuprofen (ADVIL,MOTRIN) 200 MG tablet Take 400 mg by mouth every 6 (six) hours as needed.    [provider]  verapamil (CALAN-SR) 240 MG CR tablet Take 1 tablet (240 mg total) by mouth daily. 02/02/21   [name],  MD      Allergies    Patient has no known allergies.    Review of Systems   Review of Systems See above  Physical Exam Updated Vital Signs BP 116/78 (BP Location: Right Arm)   Pulse 64   Temp 97.6 F (36.4 C) (Oral)   Resp 18   Ht 5' (1.524 m)   Wt 62.1 kg   SpO2 99%   BMI 26.76 kg/m  Physical Exam Vitals and nursing note reviewed.  Constitutional:      General: She is not in acute distress.    Appearance: She is well-developed. She is not diaphoretic.  HENT:     Head: Normocephalic.     Comments: Abrasion occiput, 2mm laceration Eyes:     Conjunctiva/sclera: Conjunctivae normal.  Cardiovascular:     Rate and Rhythm: Normal rate and regular rhythm.     Heart sounds: Normal heart sounds. No murmur heard.   No friction rub. No gallop.  Pulmonary:     Effort: Pulmonary effort is normal. No respiratory distress.     Breath sounds: Normal breath sounds. No wheezing or rales.  Abdominal:     General: There is no distension.     Palpations: Abdomen is soft.     Tenderness:  There is no abdominal tenderness. There is no guarding.  Musculoskeletal:        General: Tenderness (lower sacrum/coccyx) present.     Cervical back: Normal range of motion.  Skin:    General: Skin is warm and dry.     Findings: No erythema or rash.  Neurological:     Mental Status: She is alert and oriented to person, place, and time.    ED Results / Procedures / Treatments   Labs (all labs ordered are listed, but only abnormal results are displayed) Labs Reviewed - No data to display  EKG None  Radiology DG Lumbar Spine Complete  Result Date: 11/14/2021 CLINICAL DATA:  Fall, back pain EXAM: LUMBAR SPINE - COMPLETE 4+ VIEW COMPARISON:  None. FINDINGS: There is no evidence of lumbar spine fracture. Grade 1 anterolisthesis of L5 on S1. Mild multilevel degenerate disc disease of the lumbar spine. Facet joint arthropathy at L5-S1. IMPRESSION: 1.  No evidence of acute fracture. 2.  Mild  multilevel degenerate disc disease. Electronically Signed   By: Larose Hires D.O.   On: 11/14/2021 11:58   DG Pelvis 1-2 Views  Result Date: 11/14/2021 CLINICAL DATA:  Post fall, now with pelvic pain. EXAM: PELVIS - 1-2 VIEW COMPARISON:  Lumbar spine and sacral radiographs-earlier same day FINDINGS: No fracture or dislocation. Suspected mild degenerative change the bilateral hips with joint space loss and subchondral sclerosis. Suspected degenerative change the lower lumbar spine, incompletely evaluated. Regional soft tissues appear normal. No acute osseous abnormalities. IMPRESSION: No displaced pelvic fracture. Electronically Signed   By: Simonne Come M.D.   On: 11/14/2021 11:56   DG Sacrum/Coccyx  Result Date: 11/14/2021 CLINICAL DATA:  Post fall, now with sacral and coccygeal pain. EXAM: SACRUM AND COCCYX - 2+ VIEW COMPARISON:  Pelvic and lumbar spine radiographs-earlier same day FINDINGS: No definite displaced sacral or coccygeal fracture. Limited visualization the bilateral SI joints and pubic symphysis is normal. Mild degenerative change the bilateral hips with joint space loss, subchondral sclerosis and osteophytosis. No evidence of avascular necrosis. Limited visualization of the lower lumbar spine is normal. Regional soft tissues appear normal. No radiopaque foreign body. IMPRESSION: No displaced sacral or coccygeal fracture. Electronically Signed   By: Simonne Come M.D.   On: 11/14/2021 11:58   CT Head Wo Contrast  Result Date: 11/14/2021 CLINICAL DATA:  Trauma EXAM: CT HEAD WITHOUT CONTRAST TECHNIQUE: Contiguous axial images were obtained from the base of the skull through the vertex without intravenous contrast. RADIATION DOSE REDUCTION: This exam was performed according to the departmental dose-optimization program which includes automated exposure control, adjustment of the mA and/or kV according to patient size and/or use of iterative reconstruction technique. COMPARISON:  None. FINDINGS:  Brain: No acute intracranial findings are seen in noncontrast CT brain. Ventricles are not dilated. There is no shift of midline structures. There are no epidural or subdural fluid collections. Minimal calcifications are seen in the basal ganglia. Vascular: Unremarkable. Skull: No fracture is seen in the calvarium. There is subcutaneous contusion/hematoma in the parietal scalp in the midline. There is small calcific density in the parietal scalp significance of this finding is not clear. This may be calcification from previous inflammation or a foreign body. Sinuses/Orbits: Unremarkable. Other: None IMPRESSION: No acute intracranial findings are seen in noncontrast CT brain. Electronically Signed   By: Ernie Avena M.D.   On: 11/14/2021 11:39   CT Maxillofacial Wo Contrast  Result Date: 11/14/2021 CLINICAL DATA:  76 year old female status post trip  and fall while running. EXAM: CT MAXILLOFACIAL WITHOUT CONTRAST TECHNIQUE: Multidetector CT imaging of the maxillofacial structures was performed. Multiplanar CT image reconstructions were also generated. RADIATION DOSE REDUCTION: This exam was performed according to the departmental dose-optimization program which includes automated exposure control, adjustment of the mA and/or kV according to patient size and/or use of iterative reconstruction technique. COMPARISON:  Head CT today reported separately. FINDINGS: Osseous: Mandible intact and normally located. Torus palatini is, normal variant. No maxilla, zygoma, pterygoid, or definite nasal bone fracture. Central skull base and visible upper cervical vertebrae appear intact. Orbits: Intact orbital walls. Globes and intraorbital soft tissues appears symmetric and normal. Sinuses: Clear throughout. Visible tympanic cavities and mastoids are clear. Soft tissues: Pharynx motion artifact. Grossly negative visible noncontrast deep soft tissue spaces of the face. No discrete superficial injury identified. Limited  intracranial: Reported separately. IMPRESSION: 1.  No acute traumatic injury identified in the Face. 2. Head CT today reported separately. Electronically Signed   By: Odessa Fleming M.D.   On: 11/14/2021 11:39    Procedures Procedures    Medications Ordered in ED Medications  Tdap (BOOSTRIX) injection 0.5 mL (0.5 mLs Intramuscular Given 11/14/21 1204)    ED Course/ Medical Decision Making/ A&P                           Medical Decision Making Amount and/or Complexity of Data Reviewed Radiology: ordered.  Risk Prescription drug management.    76 year old female with history of hypertension presents with concern for fall while trying to get away from a dog.    Due to head injury, CT head completed and CT face given associated jaw pain.  CT without signs of acute traumatic injury.  XR lumbar, sacrum, coccyx without signs of fracture. Able to ambulate with steady gait in the ED.  Clinically suspect contusion or occult fracture to coccyx. Discussed recommended supportive care, tylenol, ibuprofen, lidocaine. Patient discharged in stable condition with understanding of reasons to return.         Final Clinical Impression(s) / ED Diagnoses Final diagnoses:  Contusion of coccyx, initial encounter  Abrasion  Fall, initial encounter    Rx / DC Orders ED Discharge Orders     None         Alvira Monday, MD 11/15/21 2240

## 2021-11-24 ENCOUNTER — Other Ambulatory Visit: Payer: Self-pay

## 2021-11-24 ENCOUNTER — Encounter: Payer: Self-pay | Admitting: Family Medicine

## 2021-11-24 ENCOUNTER — Ambulatory Visit: Payer: Medicare PPO | Admitting: Family Medicine

## 2021-11-24 VITALS — BP 160/90 | HR 70 | Temp 97.1°F | Wt 143.0 lb

## 2021-11-24 DIAGNOSIS — S0003XA Contusion of scalp, initial encounter: Secondary | ICD-10-CM

## 2021-11-24 DIAGNOSIS — M545 Low back pain, unspecified: Secondary | ICD-10-CM

## 2021-11-24 MED ORDER — TRAMADOL HCL 50 MG PO TABS: 50.0000 mg | ORAL_TABLET | Freq: Three times a day (TID) | ORAL | 0 refills | Status: AC | PRN

## 2021-11-24 NOTE — Progress Notes (Signed)
? ?  Subjective:  ? ? Patient ID: Danielle Yoder, female    DOB: August 26, 1946, 76 y.o.   MRN: [id number] ? ?HPI ?She was seen in the emergency room on March 4 for evaluation of head and low back pain after falling.  This occurred because the dog lunged at her causing her to lose her balance and fall backwards hitting her head and gluteal area.  She was seen in the emergency room and CT scan of head as well as back area was negative.  She has been treating this with Tylenol and intermittent use of ibuprofen.  She is still having a lot of discomfort in both areas. ?She has also had some slight constipation from all of this as well. ? ?Review of Systems ? ?   ?Objective:  ? Physical Exam ?Alert and in no distress.  EOMI.  Other cranial nerves grossly intact.  Exam of the occipital area does show a healing contusion.  She still is having some palpable tenderness in the right SI joint area. ?The emergency room record including CT scans was evaluated. ? ? ?   ?Assessment & Plan:  ?Contusion of scalp, initial encounter - Plan: traMADol (ULTRAM) 50 MG tablet ? ?Low back pain without sciatica, unspecified back pain laterality, unspecified chronicity - Plan: traMADol (ULTRAM) 50 MG tablet ?Continue with 2 Tylenol 4 times per day and you can take 3 ibuprofen 4 times per day.  If this does not work then use the pain medicine of them going to call in ?You can also take either Metamucil or MiraLAX to help with the constipation ?If you still have not as much trouble in a week as you are now ,come back ? ?

## 2021-11-24 NOTE — Patient Instructions (Addendum)
Continue with 2 Tylenol 4 times per day and you can take 3 ibuprofen 4 times per day.  If this does not work then use the pain medicine of them going to call in ?You can also take either Metamucil or MiraLAX to help with the constipation ?If you still have not as much trouble in a week as you are now ,come back ?

## 2022-02-05 ENCOUNTER — Ambulatory Visit (INDEPENDENT_AMBULATORY_CARE_PROVIDER_SITE_OTHER): Payer: Medicare PPO

## 2022-02-05 VITALS — Ht 60.0 in | Wt 130.0 lb

## 2022-02-05 DIAGNOSIS — Z0000 Encounter for general adult medical examination without abnormal findings: Secondary | ICD-10-CM

## 2022-02-05 NOTE — Patient Instructions (Signed)
Danielle Yoder , Thank you for taking time to come for your Medicare Wellness Visit. I appreciate your ongoing commitment to your health goals. Please review the following plan we discussed and let me know if I can assist you in the future.   Screening recommendations/referrals: Colonoscopy: cologuard 05/07/2019, due 05/06/2022 Mammogram: completed 11/09/2021, due 11/10/2022 Bone Density: completed 04/17/2020 Recommended yearly ophthalmology/optometry visit for glaucoma screening and checkup Recommended yearly dental visit for hygiene and checkup  Vaccinations: Influenza vaccine: due 04/13/2022 Pneumococcal vaccine: completed 03/25/2016 Tdap vaccine: completed 11/14/2021, due 11/15/2031 Shingles vaccine: completed   Covid-19: 06/09/2021, 12/24/2020, 06/03/2020, 10/25/2019, 10/25/2019, 10/04/2019  Advanced directives: copy in chart  Conditions/risks identified: none  Next appointment: Follow up in one year for your annual wellness visit    Preventive Care 65 Years and Older, Female Preventive care refers to lifestyle choices and visits with your health care provider that can promote health and wellness. What does preventive care include? A yearly physical exam. This is also called an annual well check. Dental exams once or twice a year. Routine eye exams. Ask your health care provider how often you should have your eyes checked. Personal lifestyle choices, including: Daily care of your teeth and gums. Regular physical activity. Eating a healthy diet. Avoiding tobacco and drug use. Limiting alcohol use. Practicing safe sex. Taking low-dose aspirin every day. Taking vitamin and mineral supplements as recommended by your health care provider. What happens during an annual well check? The services and screenings done by your health care provider during your annual well check will depend on your age, overall health, lifestyle risk factors, and family history of disease. Counseling  Your health care  provider may ask you questions about your: Alcohol use. Tobacco use. Drug use. Emotional well-being. Home and relationship well-being. Sexual activity. Eating habits. History of falls. Memory and ability to understand (cognition). Work and work Astronomer. Reproductive health. Screening  You may have the following tests or measurements: Height, weight, and BMI. Blood pressure. Lipid and cholesterol levels. These may be checked every 5 years, or more frequently if you are over 15 years old. Skin check. Lung cancer screening. You may have this screening every year starting at age 54 if you have a 30-pack-year history of smoking and currently smoke or have quit within the past 15 years. Fecal occult blood test (FOBT) of the stool. You may have this test every year starting at age 1. Flexible sigmoidoscopy or colonoscopy. You may have a sigmoidoscopy every 5 years or a colonoscopy every 10 years starting at age 25. Hepatitis C blood test. Hepatitis B blood test. Sexually transmitted disease (STD) testing. Diabetes screening. This is done by checking your blood sugar (glucose) after you have not eaten for a while (fasting). You may have this done every 1-3 years. Bone density scan. This is done to screen for osteoporosis. You may have this done starting at age 60. Mammogram. This may be done every 1-2 years. Talk to your health care provider about how often you should have regular mammograms. Talk with your health care provider about your test results, treatment options, and if necessary, the need for more tests. Vaccines  Your health care provider may recommend certain vaccines, such as: Influenza vaccine. This is recommended every year. Tetanus, diphtheria, and acellular pertussis (Tdap, Td) vaccine. You may need a Td booster every 10 years. Zoster vaccine. You may need this after age 10. Pneumococcal 13-valent conjugate (PCV13) vaccine. One dose is recommended after age  27. Pneumococcal polysaccharide (  PPSV23) vaccine. One dose is recommended after age 69. Talk to your health care provider about which screenings and vaccines you need and how often you need them. This information is not intended to replace advice given to you by your health care provider. Make sure you discuss any questions you have with your health care provider. Document Released: 09/26/2015 Document Revised: 05/19/2016 Document Reviewed: 07/01/2015 Elsevier Interactive Patient Education  2017 [place] Prevention in the Home Falls can cause injuries. They can happen to people of all ages. There are many things you can do to make your home safe and to help prevent falls. What can I do on the outside of my home? Regularly fix the edges of walkways and driveways and fix any cracks. Remove anything that might make you trip as you walk through a door, such as a raised step or threshold. Trim any bushes or trees on the path to your home. Use bright outdoor lighting. Clear any walking paths of anything that might make someone trip, such as rocks or tools. Regularly check to see if handrails are loose or broken. Make sure that both sides of any steps have handrails. Any raised decks and porches should have guardrails on the edges. Have any leaves, snow, or ice cleared regularly. Use sand or salt on walking paths during winter. Clean up any spills in your garage right away. This includes oil or grease spills. What can I do in the bathroom? Use night lights. Install grab bars by the toilet and in the tub and shower. Do not use towel bars as grab bars. Use non-skid mats or decals in the tub or shower. If you need to sit down in the shower, use a plastic, non-slip stool. Keep the floor dry. Clean up any water that spills on the floor as soon as it happens. Remove soap buildup in the tub or shower regularly. Attach bath mats securely with double-sided non-slip rug tape. Do not have throw  rugs and other things on the floor that can make you trip. What can I do in the bedroom? Use night lights. Make sure that you have a light by your bed that is easy to reach. Do not use any sheets or blankets that are too big for your bed. They should not hang down onto the floor. Have a firm chair that has side arms. You can use this for support while you get dressed. Do not have throw rugs and other things on the floor that can make you trip. What can I do in the kitchen? Clean up any spills right away. Avoid walking on wet floors. Keep items that you use a lot in easy-to-reach places. If you need to reach something above you, use a strong step stool that has a grab bar. Keep electrical cords out of the way. Do not use floor polish or wax that makes floors slippery. If you must use wax, use non-skid floor wax. Do not have throw rugs and other things on the floor that can make you trip. What can I do with my stairs? Do not leave any items on the stairs. Make sure that there are handrails on both sides of the stairs and use them. Fix handrails that are broken or loose. Make sure that handrails are as long as the stairways. Check any carpeting to make sure that it is firmly attached to the stairs. Fix any carpet that is loose or worn. Avoid having throw rugs at the top or bottom  of the stairs. If you do have throw rugs, attach them to the floor with carpet tape. Make sure that you have a light switch at the top of the stairs and the bottom of the stairs. If you do not have them, ask someone to add them for you. What else can I do to help prevent falls? Wear shoes that: Do not have high heels. Have rubber bottoms. Are comfortable and fit you well. Are closed at the toe. Do not wear sandals. If you use a stepladder: Make sure that it is fully opened. Do not climb a closed stepladder. Make sure that both sides of the stepladder are locked into place. Ask someone to hold it for you, if  possible. Clearly mark and make sure that you can see: Any grab bars or handrails. First and last steps. Where the edge of each step is. Use tools that help you move around (mobility aids) if they are needed. These include: Canes. Walkers. Scooters. Crutches. Turn on the lights when you go into a dark area. Replace any light bulbs as soon as they burn out. Set up your furniture so you have a clear path. Avoid moving your furniture around. If any of your floors are uneven, fix them. If there are any pets around you, be aware of where they are. Review your medicines with your doctor. Some medicines can make you feel dizzy. This can increase your chance of falling. Ask your doctor what other things that you can do to help prevent falls. This information is not intended to replace advice given to you by your health care provider. Make sure you discuss any questions you have with your health care provider. Document Released: 06/26/2009 Document Revised: 02/05/2016 Document Reviewed: 10/04/2014 Elsevier Interactive Patient Education  2017 [employer].

## 2022-02-05 NOTE — Progress Notes (Signed)
I connected with Danielle Yoder today by telephone and verified that I am speaking with the correct person using two identifiers. Location patient: home Location provider: work Persons participating in the virtual visit: Danielle Yoder, Elisha Ponder LPN.   I discussed the limitations, risks, security and privacy concerns of performing an evaluation and management service by telephone and the availability of in person appointments. I also discussed with the patient that there may be a patient responsible charge related to this service. The patient expressed understanding and verbally consented to this telephonic visit.    Interactive audio and video telecommunications were attempted between this provider and patient, however failed, due to patient having technical difficulties OR patient did not have access to video capability.  We continued and completed visit with audio only.     Vital signs may be patient reported or missing.  Subjective:   Danielle Yoder is a 76 y.o. female who presents for Medicare Annual (Subsequent) preventive examination.  Review of Systems     Cardiac Risk Factors include: advanced age (>53men, >95 women);dyslipidemia;hypertension     Objective:    Today's Vitals   02/05/22 1156  Weight: 130 lb (59 kg)  Height: 5' (1.524 m)   Body mass index is 25.39 kg/m.     02/05/2022   12:00 PM 11/14/2021   10:36 AM 02/02/2021    9:39 AM 01/30/2019   10:26 AM 12/13/2017   11:35 AM 01/28/2015   11:31 AM  Advanced Directives  Does Patient Have a Medical Advance Directive? Yes No Yes Yes Yes No  Type of Estate agent of [place];Living will  Living will Healthcare Power of [place];Living will Healthcare Power of [place];Living will   Does patient want to make changes to medical advance directive?   No - Patient declined No - Patient declined No - Patient declined   Copy of Healthcare Power of Attorney in Chart? Yes - validated most recent copy  scanned in chart (See row information)   No - copy requested    Would patient like information on creating a medical advance directive?  No - Patient declined    Yes - Educational materials given    Current Medications (verified) Outpatient Encounter Medications as of 02/05/2022  Medication Sig   atorvastatin (LIPITOR) 40 MG tablet Take 1 tablet (40 mg total) by mouth daily.   Calcium-Vitamin D (CALTRATE 600 PLUS-VIT D PO) Take by mouth.   hydrochlorothiazide (MICROZIDE) 12.5 MG capsule Take 1 capsule (12.5 mg total) by mouth daily.   ibuprofen (ADVIL,MOTRIN) 200 MG tablet Take 400 mg by mouth every 6 (six) hours as needed.   verapamil (CALAN-SR) 240 MG CR tablet Take 1 tablet (240 mg total) by mouth daily.   No facility-administered encounter medications on file as of 02/05/2022.    Allergies (verified) Patient has no known allergies.   History: Past Medical History:  Diagnosis Date   Allergy    RHINITIS   Arthritis    Colonic polyp    Dyslipidemia    Hemorrhoids    Hypertension    Past Surgical History:  Procedure Laterality Date   FOOT SURGERY Right    bone shaved off; 2 surgeries (recurred 8 yrs later)   FOOT SURGERY Left    bone shaved off ; 2 surgeries (recurred)   Family History  Problem Relation Age of Onset   Heart disease Mother 24       MI   Cancer Father    Hypertension Sister  Breast cancer Sister        80's recur 30's dbl mastectomy   Hypertension Brother    Heart disease Brother 35       stent   Social History   Socioeconomic History   Marital status: Married    Spouse name: Not on file   Number of children: Not on file   Years of education: Not on file   Highest education level: Not on file  Occupational History   Not on file  Tobacco Use   Smoking status: Never   Smokeless tobacco: Never  Vaping Use   Vaping Use: Never used  Substance and Sexual Activity   Alcohol use: No   Drug use: No   Sexual activity: Yes  Other Topics Concern    Not on file  Social History Narrative   Not on file   Social Determinants of Health   Financial Resource Strain: Low Risk    Difficulty of Paying Living Expenses: Not hard at all  Food Insecurity: No Food Insecurity   Worried About Charity fundraiser in the Last Year: Never true   Mar-Mac in the Last Year: Never true  Transportation Needs: No Transportation Needs   Lack of Transportation (Medical): No   Lack of Transportation (Non-Medical): No  Physical Activity: Sufficiently Active   Days of Exercise per Week: 5 days   Minutes of Exercise per Session: 60 min  Stress: No Stress Concern Present   Feeling of Stress : Not at all  Social Connections: Not on file    Tobacco Counseling Counseling given: Not Answered   Clinical Intake:  Pre-visit preparation completed: Yes  Pain : No/denies pain     Nutritional Status: BMI 25 -29 Overweight Nutritional Risks: None Diabetes: No  How often do you need to have someone help you when you read instructions, pamphlets, or other written materials from your doctor or pharmacy?: 1 - Never What is the last grade level you completed in school?: BS degree  Diabetic? no  Interpreter Needed?: No  Information entered by :: NAllen LPN   Activities of Daily Living    02/05/2022   12:02 PM  In your present state of health, do you have any difficulty performing the following activities:  Hearing? 0  Vision? 0  Difficulty concentrating or making decisions? 0  Walking or climbing stairs? 0  Dressing or bathing? 0  Doing errands, shopping? 0  Preparing Food and eating ? N  Using the Toilet? N  In the past six months, have you accidently leaked urine? N  Do you have problems with loss of bowel control? N  Managing your Medications? N  Managing your Finances? N  Housekeeping or managing your Housekeeping? N    Patient Care Team: Denita Lung, MD as PCP - General (Family Medicine)  Indicate any recent Medical  Services you may have received from other than [facility] providers in the past year (date may be approximate).     Assessment:   This is a routine wellness examination for Danielle Yoder.  Hearing/Vision screen Vision Screening - Comments:: Regular eye exams, Dr. Katy Fitch  Dietary issues and exercise activities discussed: Current Exercise Habits: Home exercise routine, Type of exercise: walking, Time (Minutes): 60, Frequency (Times/Week): 5, Weekly Exercise (Minutes/Week): 300   Goals Addressed             This Visit's Progress    Patient Stated       02/05/2022, wants to lose weight  Depression Screen    02/05/2022   12:01 PM 02/02/2021    9:40 AM 03/19/2020    8:56 AM 03/04/2020    4:09 PM 01/31/2020    9:00 AM 01/30/2019    9:39 AM 12/13/2017   11:10 AM  PHQ 2/9 Scores  PHQ - 2 Score 0 0 0 0 0 0 0  PHQ- 9 Score 0          Fall Risk    02/05/2022   12:00 PM 02/02/2021    9:39 AM 03/04/2020    4:09 PM 01/31/2020    9:00 AM 01/30/2019    9:39 AM  Fall Risk   Falls in the past year? 1 0 0 0 0  Comment tripped due to a dog      Number falls in past yr: 1 0     Injury with Fall? 0 0     Risk for fall due to : Medication side effect No Fall Risks     Follow up Falls evaluation completed;Education provided;Falls prevention discussed Falls evaluation completed       FALL RISK PREVENTION PERTAINING TO THE HOME:  Any stairs in or around the home? Yes  If so, are there any without handrails? No  Home free of loose throw rugs in walkways, pet beds, electrical cords, etc? Yes  Adequate lighting in your home to reduce risk of falls? Yes   ASSISTIVE DEVICES UTILIZED TO PREVENT FALLS:  Life alert? No  Use of a cane, walker or w/c? No  Grab bars in the bathroom? No  Shower chair or bench in shower? No  Elevated toilet seat or a handicapped toilet? No   TIMED UP AND GO:  Was the test performed? No .      Cognitive Function:        02/05/2022   12:03 PM  6CIT Screen  What  Year? 0 points  What month? 0 points  What time? 0 points  Count back from 20 0 points  Months in reverse 0 points  Repeat phrase 0 points  Total Score 0 points    Immunizations Immunization History  Administered Date(s) Administered   Fluad Quad(high Dose 65+) 07/01/2020   Influenza Split 05/31/2011, 09/26/2012   Influenza, High Dose Seasonal PF 09/18/2013, 05/17/2017   Influenza-Unspecified 09/24/2016, 05/25/2021   PFIZER Comirnaty(Gray Top)Covid-19 Tri-Sucrose Vaccine 12/24/2020   PFIZER(Purple Top)SARS-COV-2 Vaccination 10/04/2019, 10/25/2019, 06/03/2020   Pfizer Covid-19 Vaccine Bivalent Booster 53yrs & up 06/09/2021   Pneumococcal Conjugate-13 03/25/2016   Pneumococcal Polysaccharide-23 04/30/2009, 01/07/2014   Tdap 03/13/2010, 02/02/2020, 11/14/2021   Zoster Recombinat (Shingrix) 01/12/2018, 04/24/2018   Zoster, Live 05/01/2008    TDAP status: Up to date  Flu Vaccine status: Up to date  Pneumococcal vaccine status: Up to date  Covid-19 vaccine status: Completed vaccines  Qualifies for Shingles Vaccine? Yes   Zostavax completed Yes   Shingrix Completed?: Yes  Screening Tests Health Maintenance  Topic Date Due   INFLUENZA VACCINE  04/13/2022   Fecal DNA (Cologuard)  05/09/2022   TETANUS/TDAP  11/15/2031   Pneumonia Vaccine 54+ Years old  Completed   DEXA SCAN  Completed   COVID-19 Vaccine  Completed   Hepatitis C Screening  Completed   Zoster Vaccines- Shingrix  Completed   HPV VACCINES  Aged Out    Health Maintenance  There are no preventive care reminders to display for this patient.  Colorectal cancer screening: Type of screening: Cologuard. Completed 05/10/2019. Repeat every 3 years  Mammogram status:  Completed 11/09/2021. Repeat every year  Bone Density status: Completed 04/17/2020.   Lung Cancer Screening: (Low Dose CT Chest recommended if Age 20-80 years, 30 pack-year currently smoking OR have quit w/in 15years.) does not qualify.   Lung Cancer  Screening Referral: no  Additional Screening:  Hepatitis C Screening: does qualify; Completed 03/25/2016  Vision Screening: Recommended annual ophthalmology exams for early detection of glaucoma and other disorders of the eye. Is the patient up to date with their annual eye exam?  Yes  Who is the provider or what is the name of the office in which the patient attends annual eye exams? Dr. Katy Fitch  If pt is not established with a provider, would they like to be referred to a provider to establish care? No .   Dental Screening: Recommended annual dental exams for proper oral hygiene  Community Resource Referral / Chronic Care Management: CRR required this visit?  No   CCM required this visit?  No      Plan:     I have personally reviewed and noted the following in the patient's chart:   Medical and social history Use of alcohol, tobacco or illicit drugs  Current medications and supplements including opioid prescriptions.  Functional ability and status Nutritional status Physical activity Advanced directives List of other physicians Hospitalizations, surgeries, and ER visits in previous 12 months Vitals Screenings to include cognitive, depression, and falls Referrals and appointments  In addition, I have reviewed and discussed with patient certain preventive protocols, quality metrics, and best practice recommendations. A written personalized care plan for preventive services as well as general preventive health recommendations were provided to patient.     Kellie Simmering, LPN   [id number]   Nurse Notes: none  Due to this being a virtual visit, the after visit summary with patients personalized plan was offered to patient via mail or my-chart.  Patient would like to access on my-chart

## 2022-02-11 ENCOUNTER — Encounter: Payer: Self-pay | Admitting: Family Medicine

## 2022-02-11 ENCOUNTER — Ambulatory Visit: Payer: Medicare PPO | Admitting: Family Medicine

## 2022-02-11 VITALS — BP 138/70 | HR 67 | Temp 96.8°F | Ht 60.0 in | Wt 136.8 lb

## 2022-02-11 DIAGNOSIS — E785 Hyperlipidemia, unspecified: Secondary | ICD-10-CM | POA: Diagnosis not present

## 2022-02-11 DIAGNOSIS — M1990 Unspecified osteoarthritis, unspecified site: Secondary | ICD-10-CM | POA: Diagnosis not present

## 2022-02-11 DIAGNOSIS — Z0000 Encounter for general adult medical examination without abnormal findings: Secondary | ICD-10-CM | POA: Diagnosis not present

## 2022-02-11 DIAGNOSIS — I10 Essential (primary) hypertension: Secondary | ICD-10-CM | POA: Diagnosis not present

## 2022-02-11 DIAGNOSIS — J301 Allergic rhinitis due to pollen: Secondary | ICD-10-CM

## 2022-02-11 DIAGNOSIS — Z1211 Encounter for screening for malignant neoplasm of colon: Secondary | ICD-10-CM | POA: Diagnosis not present

## 2022-02-11 DIAGNOSIS — M8580 Other specified disorders of bone density and structure, unspecified site: Secondary | ICD-10-CM

## 2022-02-11 DIAGNOSIS — Z8249 Family history of ischemic heart disease and other diseases of the circulatory system: Secondary | ICD-10-CM

## 2022-02-11 MED ORDER — HYDROCHLOROTHIAZIDE 12.5 MG PO CAPS: 12.5000 mg | ORAL_CAPSULE | Freq: Every day | ORAL | 3 refills | Status: DC

## 2022-02-11 MED ORDER — VERAPAMIL HCL ER 240 MG PO TBCR: 240.0000 mg | EXTENDED_RELEASE_TABLET | Freq: Every day | ORAL | 3 refills | Status: DC

## 2022-02-11 MED ORDER — ATORVASTATIN CALCIUM 40 MG PO TABS: 40.0000 mg | ORAL_TABLET | Freq: Every day | ORAL | 3 refills | Status: DC

## 2022-02-11 NOTE — Progress Notes (Signed)
Complete physical exam  Patient: Danielle Yoder   DOB: 1945-09-21   76 y.o. Female  MRN: [id number]  Subjective:      Danielle Yoder is a 76 y.o. female who presents today for a complete physical exam. She reports consuming a general, low fat, and low sodium diet. Gym/ health club routine includes walking on track . She generally feels well. She reports sleeping well. She does have difficulty with arthritis mainly in her hands but the symptoms go away fairly quickly.  Her allergies are under good control.  She does have osteopenia and is taking extra vitamin D and calcium.  She continues on atorvastatin without difficulty and is also taking HCTZ and verapamil for her blood pressure.  She does have a history of colonic polyps however it was hyperplastic.  She is now retired.  She has been married for almost 5 years.  Life seem to be going very well for her.   Most recent fall risk assessment:    02/05/2022   12:00 PM  [place] in the past year? 1  Comment tripped due to a dog  Number falls in past yr: 1  Injury with Fall? 0  Risk for fall due to : Medication side effect  Follow up Falls evaluation completed;Education provided;Falls prevention discussed     Most recent depression screenings:    02/05/2022   12:01 PM 02/02/2021    9:40 AM  PHQ 2/9 Scores  PHQ - 2 Score 0 0  PHQ- 9 Score 0       Patient Active Problem List   Diagnosis Date Noted   Osteopenia 02/17/2018   Family history of heart disease in female family member before age 67 05/31/2011   Arthritis 05/31/2011   Hyperlipidemia with target LDL less than 100 05/31/2011   Hypertension 05/31/2011   Allergic rhinitis due to pollen 05/31/2011   History of colonic polyps 05/31/2011   Past Medical History:  Diagnosis Date   Allergy    RHINITIS   Arthritis    Colonic polyp    Dyslipidemia    Hemorrhoids    Hypertension    Past Surgical History:  Procedure Laterality Date   FOOT SURGERY Right     bone shaved off; 2 surgeries (recurred 8 yrs later)   FOOT SURGERY Left    bone shaved off ; 2 surgeries (recurred)   Social History   Tobacco Use   Smoking status: Never   Smokeless tobacco: Never  Vaping Use   Vaping Use: Never used  Substance Use Topics   Alcohol use: No   Drug use: No   Family History  Problem Relation Age of Onset   Heart disease Mother 58       MI   Cancer Father    Hypertension Sister    Breast cancer Sister        12's recur 70's dbl mastectomy   Hypertension Brother    Heart disease Brother 69       stent   No Known Allergies    Patient Care Team: Denita Lung, MD as PCP - General (Family Medicine)   Outpatient Medications Prior to Visit  Medication Sig   Calcium-Vitamin D (CALTRATE 600 PLUS-VIT D PO) Take by mouth.   ibuprofen (ADVIL,MOTRIN) 200 MG tablet Take 400 mg by mouth every 6 (six) hours as needed.   [DISCONTINUED] atorvastatin (LIPITOR) 40 MG tablet Take 1 tablet (40 mg total) by mouth daily.   [  DISCONTINUED] hydrochlorothiazide (MICROZIDE) 12.5 MG capsule Take 1 capsule (12.5 mg total) by mouth daily.   [DISCONTINUED] verapamil (CALAN-SR) 240 MG CR tablet Take 1 tablet (240 mg total) by mouth daily.   No facility-administered medications prior to visit.    Review of Systems  All other systems reviewed and are negative.        Objective:     BP 138/70   Pulse 67   Temp (!) 96.8 F (36 C)   Ht 5' (1.524 m)   Wt 136 lb 12.8 oz (62.1 kg)   SpO2 98%   BMI 26.72 kg/m  BP Readings from Last 3 Encounters:  02/11/22 138/70  11/24/21 (!) 160/90  11/14/21 116/78      Physical Exam  Alert and in no distress. Tympanic membranes and canals are normal. Pharyngeal area is normal. Neck is supple without adenopathy or thyromegaly. Cardiac exam shows a regular sinus rhythm without murmurs or gallops. Lungs are clear to auscultation.  Last CBC Lab Results  Component Value Date   WBC 7.5 02/02/2021   HGB 13.6  02/02/2021   HCT 41.9 02/02/2021   MCV 83 02/02/2021   MCH 27.0 02/02/2021   RDW 12.8 02/02/2021   PLT 227 81/44/8185   Last metabolic panel Lab Results  Component Value Date   GLUCOSE 90 02/02/2021   NA 143 02/02/2021   K 4.7 02/02/2021   CL 103 02/02/2021   CO2 25 02/02/2021   BUN 18 02/02/2021   CREATININE 0.84 02/02/2021   EGFR 73 02/02/2021   CALCIUM 11.0 (H) 02/02/2021   PROT 7.0 02/02/2021   ALBUMIN 4.6 02/02/2021   LABGLOB 2.4 02/02/2021   AGRATIO 1.9 02/02/2021   BILITOT 0.5 02/02/2021   ALKPHOS 75 02/02/2021   AST 19 02/02/2021   ALT 16 02/02/2021   Last lipids Lab Results  Component Value Date   CHOL 169 02/02/2021   HDL 46 02/02/2021   LDLCALC 101 (H) 02/02/2021   TRIG 124 02/02/2021   CHOLHDL 3.7 02/02/2021   Last vitamin D No results found for: 25OHVITD2, 25OHVITD3, VD25OH      Assessment & Plan:    Routine general medical examination at a health care facility  Hyperlipidemia with target LDL less than 100 - Plan: Lipid panel, atorvastatin (LIPITOR) 40 MG tablet  Osteopenia, unspecified location - Plan: DG Bone Density  Primary hypertension - Plan: CBC with Differential/Platelet, Comprehensive metabolic panel  Family history of heart disease in female family member before age 58 - Plan: CBC with Differential/Platelet, Comprehensive metabolic panel  Arthritis - Plan: CBC with Differential/Platelet, Comprehensive metabolic panel  Non-seasonal allergic rhinitis due to pollen  Screening for colon cancer - Plan: Cologuard  Essential hypertension - Plan: verapamil (CALAN-SR) 240 MG CR tablet, hydrochlorothiazide (MICROZIDE) 12.5 MG capsule  Immunization History  Administered Date(s) Administered   Fluad Quad(high Dose 65+) 07/01/2020   Influenza Split 05/31/2011, 09/26/2012   Influenza, High Dose Seasonal PF 09/18/2013, 05/17/2017   Influenza-Unspecified 09/24/2016, 05/25/2021   PFIZER Comirnaty(Gray Top)Covid-19 Tri-Sucrose Vaccine 12/24/2020    PFIZER(Purple Top)SARS-COV-2 Vaccination 10/04/2019, 10/25/2019, 06/03/2020   Pfizer Covid-19 Vaccine Bivalent Booster 34yr & up 06/09/2021   Pneumococcal Conjugate-13 03/25/2016   Pneumococcal Polysaccharide-23 04/30/2009, 01/07/2014   Tdap 03/13/2010, 02/02/2020, 11/14/2021   Zoster Recombinat (Shingrix) 01/12/2018, 04/24/2018   Zoster, Live 05/01/2008    Health Maintenance  Topic Date Due   INFLUENZA VACCINE  04/13/2022   Fecal DNA (Cologuard)  05/09/2022   TETANUS/TDAP  11/15/2031   Pneumonia Vaccine 65+  Years old  Completed   DEXA SCAN  Completed   COVID-19 Vaccine  Completed   Hepatitis C Screening  Completed   Zoster Vaccines- Shingrix  Completed   HPV VACCINES  Aged Out    Discussed health benefits of physical activity, and encouraged her to engage in regular exercise appropriate for her age and condition.  Problem List Items Addressed This Visit     Allergic rhinitis due to pollen   Arthritis   Relevant Orders   CBC with Differential/Platelet   Comprehensive metabolic panel   Family history of heart disease in female family member before age 48   Relevant Orders   CBC with Differential/Platelet   Comprehensive metabolic panel   Hyperlipidemia with target LDL less than 100   Relevant Medications   verapamil (CALAN-SR) 240 MG CR tablet   atorvastatin (LIPITOR) 40 MG tablet   hydrochlorothiazide (MICROZIDE) 12.5 MG capsule   Other Relevant Orders   Lipid panel   Hypertension   Relevant Medications   verapamil (CALAN-SR) 240 MG CR tablet   atorvastatin (LIPITOR) 40 MG tablet   hydrochlorothiazide (MICROZIDE) 12.5 MG capsule   Other Relevant Orders   CBC with Differential/Platelet   Comprehensive metabolic panel   Osteopenia   Relevant Orders   DG Bone Density   Other Visit Diagnoses     Routine general medical examination at a health care facility    -  Primary   Screening for colon cancer       Relevant Orders   Cologuard   Essential hypertension        Relevant Medications   verapamil (CALAN-SR) 240 MG CR tablet   atorvastatin (LIPITOR) 40 MG tablet   hydrochlorothiazide (MICROZIDE) 12.5 MG capsule      Return in about 1 year (around 02/12/2023) for awv/cpe .     Jill Alexanders, MD

## 2022-02-12 LAB — COMPREHENSIVE METABOLIC PANEL
ALT: 14 IU/L (ref 0–32)
AST: 19 IU/L (ref 0–40)
Albumin/Globulin Ratio: 1.6 (ref 1.2–2.2)
Albumin: 4.3 g/dL (ref 3.7–4.7)
Alkaline Phosphatase: 77 IU/L (ref 44–121)
BUN/Creatinine Ratio: 17 (ref 12–28)
BUN: 15 mg/dL (ref 8–27)
Bilirubin Total: 0.5 mg/dL (ref 0.0–1.2)
CO2: 24 mmol/L (ref 20–29)
Calcium: 10.7 mg/dL — ABNORMAL HIGH (ref 8.7–10.3)
Chloride: 105 mmol/L (ref 96–106)
Creatinine, Ser: 0.9 mg/dL (ref 0.57–1.00)
Globulin, Total: 2.7 g/dL (ref 1.5–4.5)
Glucose: 90 mg/dL (ref 70–99)
Potassium: 4.6 mmol/L (ref 3.5–5.2)
Sodium: 140 mmol/L (ref 134–144)
Total Protein: 7 g/dL (ref 6.0–8.5)
eGFR: 67 mL/min/{1.73_m2} (ref >59)

## 2022-02-12 LAB — CBC WITH DIFFERENTIAL/PLATELET
Basophils Absolute: 0.1 10*3/uL (ref 0.0–0.2)
Basos: 1 %
EOS (ABSOLUTE): 0.5 10*3/uL — ABNORMAL HIGH (ref 0.0–0.4)
Eos: 9 %
Hematocrit: 39.8 % (ref 34.0–46.6)
Hemoglobin: 12.9 g/dL (ref 11.1–15.9)
Immature Grans (Abs): 0 10*3/uL (ref 0.0–0.1)
Immature Granulocytes: 0 %
Lymphocytes Absolute: 2.1 10*3/uL (ref 0.7–3.1)
Lymphs: 35 %
MCH: 27.1 pg (ref 26.6–33.0)
MCHC: 32.4 g/dL (ref 31.5–35.7)
MCV: 84 fL (ref 79–97)
Monocytes Absolute: 0.6 10*3/uL (ref 0.1–0.9)
Monocytes: 10 %
Neutrophils Absolute: 2.7 10*3/uL (ref 1.4–7.0)
Neutrophils: 45 %
Platelets: 223 10*3/uL (ref 150–450)
RBC: 4.76 x10E6/uL (ref 3.77–5.28)
RDW: 12.8 % (ref 11.7–15.4)
WBC: 6 10*3/uL (ref 3.4–10.8)

## 2022-02-12 LAB — LIPID PANEL
Chol/HDL Ratio: 3.4 ratio (ref 0.0–4.4)
Cholesterol, Total: 162 mg/dL (ref 100–199)
HDL: 48 mg/dL (ref >39)
LDL Chol Calc (NIH): 96 mg/dL (ref 0–99)
Triglycerides: 97 mg/dL (ref 0–149)
VLDL Cholesterol Cal: 18 mg/dL (ref 5–40)

## 2022-03-24 DIAGNOSIS — H43391 Other vitreous opacities, right eye: Secondary | ICD-10-CM | POA: Diagnosis not present

## 2022-03-24 DIAGNOSIS — H25813 Combined forms of age-related cataract, bilateral: Secondary | ICD-10-CM | POA: Diagnosis not present

## 2022-03-24 DIAGNOSIS — H43811 Vitreous degeneration, right eye: Secondary | ICD-10-CM | POA: Diagnosis not present

## 2022-05-19 ENCOUNTER — Encounter: Payer: Self-pay | Admitting: Internal Medicine

## 2022-05-19 DIAGNOSIS — Z1211 Encounter for screening for malignant neoplasm of colon: Secondary | ICD-10-CM | POA: Diagnosis not present

## 2022-05-19 LAB — COLOGUARD: Cologuard: NEGATIVE

## 2022-05-23 LAB — COLOGUARD: COLOGUARD: NEGATIVE

## 2022-06-22 ENCOUNTER — Encounter: Payer: Self-pay | Admitting: Internal Medicine

## 2022-06-28 ENCOUNTER — Telehealth: Payer: Self-pay | Admitting: Family Medicine

## 2022-06-28 NOTE — Telephone Encounter (Signed)
Pt's husband called and wanted to now if wife should received the RSV vaccine. Please advise at [phone].

## 2022-07-05 ENCOUNTER — Encounter: Payer: Self-pay | Admitting: Internal Medicine

## 2022-08-10 ENCOUNTER — Ambulatory Visit
Admission: RE | Admit: 2022-08-10 | Discharge: 2022-08-10 | Disposition: A | Discharge status: Home / Self Care | Payer: Medicare PPO | Source: Ambulatory Visit | Attending: Family Medicine | Admitting: Family Medicine

## 2022-08-10 DIAGNOSIS — Z780 Asymptomatic menopausal state: Secondary | ICD-10-CM | POA: Diagnosis not present

## 2022-08-10 DIAGNOSIS — M85852 Other specified disorders of bone density and structure, left thigh: Secondary | ICD-10-CM | POA: Diagnosis not present

## 2022-09-07 ENCOUNTER — Emergency Department (HOSPITAL_COMMUNITY): Payer: Medicare PPO

## 2022-09-07 ENCOUNTER — Other Ambulatory Visit: Payer: Self-pay

## 2022-09-07 ENCOUNTER — Telehealth: Payer: Self-pay | Admitting: Family Medicine

## 2022-09-07 ENCOUNTER — Encounter (HOSPITAL_COMMUNITY): Payer: Self-pay

## 2022-09-07 ENCOUNTER — Inpatient Hospital Stay (HOSPITAL_COMMUNITY)
Admission: EM | Admit: 2022-09-07 | Discharge: 2022-09-10 | DRG: 489 | Disposition: A | Discharge status: Home / Self Care | Payer: Medicare PPO | Attending: Internal Medicine | Admitting: Internal Medicine

## 2022-09-07 DIAGNOSIS — Y9301 Activity, walking, marching and hiking: Secondary | ICD-10-CM | POA: Diagnosis present

## 2022-09-07 DIAGNOSIS — R262 Difficulty in walking, not elsewhere classified: Secondary | ICD-10-CM | POA: Diagnosis present

## 2022-09-07 DIAGNOSIS — W19XXXA Unspecified fall, initial encounter: Secondary | ICD-10-CM | POA: Diagnosis not present

## 2022-09-07 DIAGNOSIS — S82001A Unspecified fracture of right patella, initial encounter for closed fracture: Principal | ICD-10-CM | POA: Diagnosis present

## 2022-09-07 DIAGNOSIS — S82041A Displaced comminuted fracture of right patella, initial encounter for closed fracture: Secondary | ICD-10-CM | POA: Diagnosis present

## 2022-09-07 DIAGNOSIS — Z79899 Other long term (current) drug therapy: Secondary | ICD-10-CM | POA: Diagnosis not present

## 2022-09-07 DIAGNOSIS — M25561 Pain in right knee: Secondary | ICD-10-CM | POA: Diagnosis present

## 2022-09-07 DIAGNOSIS — E785 Hyperlipidemia, unspecified: Secondary | ICD-10-CM | POA: Diagnosis present

## 2022-09-07 DIAGNOSIS — M1990 Unspecified osteoarthritis, unspecified site: Secondary | ICD-10-CM | POA: Diagnosis present

## 2022-09-07 DIAGNOSIS — K5900 Constipation, unspecified: Secondary | ICD-10-CM | POA: Diagnosis not present

## 2022-09-07 DIAGNOSIS — M8580 Other specified disorders of bone density and structure, unspecified site: Secondary | ICD-10-CM | POA: Diagnosis present

## 2022-09-07 DIAGNOSIS — I10 Essential (primary) hypertension: Secondary | ICD-10-CM | POA: Diagnosis present

## 2022-09-07 DIAGNOSIS — S82009A Unspecified fracture of unspecified patella, initial encounter for closed fracture: Secondary | ICD-10-CM | POA: Diagnosis present

## 2022-09-07 DIAGNOSIS — Z8249 Family history of ischemic heart disease and other diseases of the circulatory system: Secondary | ICD-10-CM | POA: Diagnosis not present

## 2022-09-07 DIAGNOSIS — Z86010 Personal history of colonic polyps: Secondary | ICD-10-CM

## 2022-09-07 DIAGNOSIS — W01198A Fall on same level from slipping, tripping and stumbling with subsequent striking against other object, initial encounter: Secondary | ICD-10-CM | POA: Diagnosis present

## 2022-09-07 DIAGNOSIS — J310 Chronic rhinitis: Secondary | ICD-10-CM | POA: Diagnosis not present

## 2022-09-07 DIAGNOSIS — Y92009 Unspecified place in unspecified non-institutional (private) residence as the place of occurrence of the external cause: Secondary | ICD-10-CM | POA: Diagnosis not present

## 2022-09-07 DIAGNOSIS — Z01818 Encounter for other preprocedural examination: Secondary | ICD-10-CM | POA: Diagnosis not present

## 2022-09-07 LAB — PROTIME-INR
INR: 1.1 (ref 0.8–1.2)
Prothrombin Time: 13.9 seconds (ref 11.4–15.2)

## 2022-09-07 LAB — CK: Total CK: 462 U/L — ABNORMAL HIGH (ref 38–234)

## 2022-09-07 LAB — BASIC METABOLIC PANEL
Anion gap: 9 (ref 5–15)
BUN: 19 mg/dL (ref 8–23)
CO2: 24 mmol/L (ref 22–32)
Calcium: 10.2 mg/dL (ref 8.9–10.3)
Chloride: 106 mmol/L (ref 98–111)
Creatinine, Ser: 0.76 mg/dL (ref 0.44–1.00)
GFR, Estimated: >60 mL/min (ref >60)
Glucose, Bld: 90 mg/dL (ref 70–99)
Potassium: 3.8 mmol/L (ref 3.5–5.1)
Sodium: 139 mmol/L (ref 135–145)

## 2022-09-07 LAB — VITAMIN D 25 HYDROXY (VIT D DEFICIENCY, FRACTURES): Vit D, 25-Hydroxy: 45.42 ng/mL (ref 30–100)

## 2022-09-07 LAB — CBC WITH DIFFERENTIAL/PLATELET
Abs Immature Granulocytes: 0.02 10*3/uL (ref 0.00–0.07)
Basophils Absolute: 0 10*3/uL (ref 0.0–0.1)
Basophils Relative: 1 %
Eosinophils Absolute: 0.2 10*3/uL (ref 0.0–0.5)
Eosinophils Relative: 4 %
HCT: 43.4 % (ref 36.0–46.0)
Hemoglobin: 13.7 g/dL (ref 12.0–15.0)
Immature Granulocytes: 0 %
Lymphocytes Relative: 31 %
Lymphs Abs: 2.2 10*3/uL (ref 0.7–4.0)
MCH: 27.2 pg (ref 26.0–34.0)
MCHC: 31.6 g/dL (ref 30.0–36.0)
MCV: 86.3 fL (ref 80.0–100.0)
Monocytes Absolute: 0.6 10*3/uL (ref 0.1–1.0)
Monocytes Relative: 9 %
Neutro Abs: 3.8 10*3/uL (ref 1.7–7.7)
Neutrophils Relative %: 55 %
Platelets: 234 10*3/uL (ref 150–400)
RBC: 5.03 MIL/uL (ref 3.87–5.11)
RDW: 13.7 % (ref 11.5–15.5)
WBC: 6.9 10*3/uL (ref 4.0–10.5)
nRBC: 0 % (ref 0.0–0.2)

## 2022-09-07 LAB — TSH: TSH: 1.97 u[IU]/mL (ref 0.350–4.500)

## 2022-09-07 MED ORDER — OXYCODONE HCL 5 MG PO TABS
5.0000 mg | ORAL_TABLET | ORAL | Status: DC | PRN
  Administered 2022-09-07: 5 mg via ORAL
  Filled 2022-09-07: qty 1

## 2022-09-07 MED ORDER — TRAZODONE HCL 50 MG PO TABS: 50.0000 mg | ORAL_TABLET | Freq: Every evening | ORAL | Status: DC | PRN

## 2022-09-07 MED ORDER — HYDRALAZINE HCL 20 MG/ML IJ SOLN: 10.0000 mg | INTRAMUSCULAR | Status: DC | PRN

## 2022-09-07 MED ORDER — SODIUM CHLORIDE 0.9 % IV SOLN: INTRAVENOUS | Status: AC

## 2022-09-07 MED ORDER — DM-GUAIFENESIN ER 30-600 MG PO TB12: 1.0000 | ORAL_TABLET | Freq: Two times a day (BID) | ORAL | Status: DC | PRN

## 2022-09-07 MED ORDER — ONDANSETRON HCL 4 MG/2ML IJ SOLN: 4.0000 mg | Freq: Four times a day (QID) | INTRAMUSCULAR | Status: DC | PRN

## 2022-09-07 MED ORDER — ENOXAPARIN SODIUM 40 MG/0.4ML IJ SOSY
40.0000 mg | PREFILLED_SYRINGE | INTRAMUSCULAR | Status: DC
  Administered 2022-09-07 – 2022-09-09 (×2): 40 mg via SUBCUTANEOUS
  Filled 2022-09-07 (×3): qty 0.4

## 2022-09-07 MED ORDER — OXYCODONE-ACETAMINOPHEN 5-325 MG PO TABS
1.0000 | ORAL_TABLET | Freq: Once | ORAL | Status: AC
  Administered 2022-09-07: 1 via ORAL
  Filled 2022-09-07: qty 1

## 2022-09-07 MED ORDER — METOPROLOL TARTRATE 5 MG/5ML IV SOLN: 5.0000 mg | INTRAVENOUS | Status: DC | PRN

## 2022-09-07 MED ORDER — VERAPAMIL HCL ER 240 MG PO TBCR
240.0000 mg | EXTENDED_RELEASE_TABLET | Freq: Every day | ORAL | Status: DC
  Administered 2022-09-08 – 2022-09-10 (×3): 240 mg via ORAL
  Filled 2022-09-07 (×3): qty 1

## 2022-09-07 MED ORDER — ACETAMINOPHEN 325 MG PO TABS
650.0000 mg | ORAL_TABLET | Freq: Four times a day (QID) | ORAL | Status: DC | PRN
  Administered 2022-09-10: 650 mg via ORAL
  Filled 2022-09-07: qty 2

## 2022-09-07 MED ORDER — IPRATROPIUM-ALBUTEROL 0.5-2.5 (3) MG/3ML IN SOLN: 3.0000 mL | RESPIRATORY_TRACT | Status: DC | PRN

## 2022-09-07 MED ORDER — ENOXAPARIN SODIUM 40 MG/0.4ML IJ SOSY: 40.0000 mg | PREFILLED_SYRINGE | INTRAMUSCULAR | Status: DC

## 2022-09-07 MED ORDER — AMLODIPINE BESYLATE 5 MG PO TABS: 5.0000 mg | ORAL_TABLET | Freq: Once | ORAL | Status: DC

## 2022-09-07 MED ORDER — OXYCODONE HCL 5 MG PO TABS
5.0000 mg | ORAL_TABLET | ORAL | Status: DC | PRN
  Administered 2022-09-07 – 2022-09-08 (×3): 10 mg via ORAL
  Filled 2022-09-07 (×3): qty 2

## 2022-09-07 MED ORDER — ATORVASTATIN CALCIUM 40 MG PO TABS
40.0000 mg | ORAL_TABLET | Freq: Every day | ORAL | Status: DC
  Administered 2022-09-07 – 2022-09-10 (×4): 40 mg via ORAL
  Filled 2022-09-07 (×4): qty 1

## 2022-09-07 MED ORDER — SENNOSIDES-DOCUSATE SODIUM 8.6-50 MG PO TABS: 1.0000 | ORAL_TABLET | Freq: Every evening | ORAL | Status: DC | PRN

## 2022-09-07 NOTE — H&P (Signed)
History and Physical    Danielle Yoder WFU:[id number] DOB: 25-Feb-1946 DOA: 09/07/2022  PCP: [name], MD Patient coming from: Home  Chief Complaint: Fall  HPI: Danielle Yoder is a 76 y.o. female with medical history significant of essential hypertension, hyperlipidemia, osteoarthritis comes to the hospital with complaints of right knee pain.  Patient states earlier she tripped and fell on her right knee on partially inflated air mattress around 10:00 in the morning.  Since then she has been having pain of this area.  Denies any other complaints including chest pain, nausea, vomiting.  In the ED patient was noted to have right patellar fracture with 13 mm distraction on the x-ray.  EDP discussed care with Dr. Christell Constant from orthopedic who recommended outpatient follow-up with patient is ambulate otherwise admit and they will consult.  Medical team was called as patient was unable to ambulate with crutches and had increasing pain of the right knee.  Social history-denies any alcohol, tobacco and illicit drug use CODE STATUS-full   Review of Systems: As per HPI otherwise 10 point review of systems negative.  Review of Systems Otherwise negative except as per HPI, including: General: Denies fever, chills, night sweats or unintended weight loss. Resp: Denies cough, wheezing, shortness of breath. Cardiac: Denies chest pain, palpitations, orthopnea, paroxysmal nocturnal dyspnea. GI: Denies abdominal pain, nausea, vomiting, diarrhea or constipation GU: Denies dysuria, frequency, hesitancy or incontinence MS: Right knee pain. Neuro: Denies headache, neurologic deficits (focal weakness, numbness, tingling), abnormal gait Psych: Denies anxiety, depression, SI/HI/AVH Skin: Denies new rashes or lesions ID: Denies sick contacts, exotic exposures, travel  Past Medical History:  Diagnosis Date   Allergy    RHINITIS   Arthritis    Colonic polyp    Dyslipidemia    Hemorrhoids     Hypertension     Past Surgical History:  Procedure Laterality Date   FOOT SURGERY Right    bone shaved off; 2 surgeries (recurred 8 yrs later)   FOOT SURGERY Left    bone shaved off ; 2 surgeries (recurred)    SOCIAL HISTORY:  reports that she has never smoked. She has never used smokeless tobacco. She reports that she does not drink alcohol and does not use drugs.  No Known Allergies  FAMILY HISTORY: Family History  Problem Relation Age of Onset   Heart disease Mother 8       MI   Cancer Father    Hypertension Sister    Breast cancer Sister        45's recur 46's dbl mastectomy   Hypertension Brother    Heart disease Brother 48       stent     Prior to Admission medications   Medication Sig Start Date End Date Taking? Authorizing Provider  atorvastatin (LIPITOR) 40 MG tablet Take 1 tablet (40 mg total) by mouth daily. Patient taking differently: Take 40 mg by mouth at bedtime. 02/11/22  Yes [name], MD  Calcium Carb-Cholecalciferol (CALCIUM + D3 PO) Take 1 tablet by mouth daily.   Yes [provider]  hydrochlorothiazide (MICROZIDE) 12.5 MG capsule Take 1 capsule (12.5 mg total) by mouth daily. 02/11/22  Yes [name], MD  TYLENOL 325 MG tablet Take 325 mg by mouth every 6 (six) hours as needed for mild pain or headache.   Yes [provider]  verapamil (CALAN-SR) 240 MG CR tablet Take 1 tablet (240 mg total) by mouth daily. 02/11/22  Yes [name], MD  Physical Exam: Vitals:   09/07/22 1140 09/07/22 1328  BP: (!) 161/74 (!) 164/87  Pulse: (!) 56 62  Resp: 16 16  Temp: 98.1 F (36.7 C) 98.4 F (36.9 C)  TempSrc: Oral   SpO2: 99% 100%      Constitutional: NAD, calm, comfortable Eyes: PERRL, lids and conjunctivae normal ENMT: Mucous membranes are moist. Posterior pharynx clear of any exudate or lesions.Normal dentition.  Neck: normal, supple, no masses, no thyromegaly Respiratory: clear to auscultation bilaterally, no  wheezing, no crackles. Normal respiratory effort. No accessory muscle use.  Cardiovascular: Regular rate and rhythm, no murmurs / rubs / gallops. No extremity edema. 2+ pedal pulses. No carotid bruits.  Abdomen: no tenderness, no masses palpated. No hepatosplenomegaly. Bowel sounds positive.  Musculoskeletal: Right knee immobilizer in place.  Neuro sensation intact in the toes.  She is able to plantar and dorsiflex her right foot.  No abnormality of her left leg. Skin: no rashes, lesions, ulcers. No induration Neurologic: CN 2-12 grossly intact. Sensation intact, DTR normal. Strength 5/5 in all 4.  Psychiatric: Normal judgment and insight. Alert and oriented x 3. Normal mood.     Labs on Admission: I have personally reviewed following labs and imaging studies  CBC: No results for input(s): "WBC", "NEUTROABS", "HGB", "HCT", "MCV", "PLT" in the last 168 hours. Basic Metabolic Panel: No results for input(s): "NA", "K", "CL", "CO2", "GLUCOSE", "BUN", "CREATININE", "CALCIUM", "MG", "PHOS" in the last 168 hours. GFR: CrCl cannot be calculated (Patient's most recent lab result is older than the maximum 21 days allowed.). Liver Function Tests: No results for input(s): "AST", "ALT", "ALKPHOS", "BILITOT", "PROT", "ALBUMIN" in the last 168 hours. No results for input(s): "LIPASE", "AMYLASE" in the last 168 hours. No results for input(s): "AMMONIA" in the last 168 hours. Coagulation Profile: No results for input(s): "INR", "PROTIME" in the last 168 hours. Cardiac Enzymes: No results for input(s): "CKTOTAL", "CKMB", "CKMBINDEX", "TROPONINI" in the last 168 hours. BNP (last 3 results) No results for input(s): "PROBNP" in the last 8760 hours. HbA1C: No results for input(s): "HGBA1C" in the last 72 hours. CBG: No results for input(s): "GLUCAP" in the last 168 hours. Lipid Profile: No results for input(s): "CHOL", "HDL", "LDLCALC", "TRIG", "CHOLHDL", "LDLDIRECT" in the last 72 hours. Thyroid  Function Tests: No results for input(s): "TSH", "T4TOTAL", "FREET4", "T3FREE", "THYROIDAB" in the last 72 hours. Anemia Panel: No results for input(s): "VITAMINB12", "FOLATE", "FERRITIN", "TIBC", "IRON", "RETICCTPCT" in the last 72 hours. Urine analysis:    Component Value Date/Time   BILIRUBINUR n 05/31/2011 0842   PROTEINUR n 05/31/2011 0842   UROBILINOGEN n 05/31/2011 0842   NITRITE n 05/31/2011 0842   LEUKOCYTESUR n 05/31/2011 0842   Sepsis Labs: !!!!!!!!!!!!!!!!!!!!!!!!!!!!!!!!!!!!!!!!!!!! @LABRCNTIP (procalcitonin:4,lacticidven:4) )No results found for this or any previous visit (from the past 240 hour(s)).   Radiological Exams on Admission: DG Knee Complete 4 Views Right  Result Date: 09/07/2022 CLINICAL DATA:  Status post fall, right knee pain EXAM: RIGHT KNEE - COMPLETE 4+ VIEW COMPARISON:  03/19/2020 FINDINGS: Comminuted fracture of the patella with 13 mm of distraction and the fracture involving the articular surface. Small joint effusion. No other acute fracture or dislocation. No aggressive osseous lesion. Normal alignment. Soft tissue are unremarkable. No radiopaque foreign body or soft tissue emphysema. IMPRESSION: 1. Comminuted fracture of the patella with 13 mm of distraction and the fracture involving the articular surface. Electronically Signed   By: 05/20/2020 M.D.   On: 09/07/2022 12:56     All images  have been reviewed by me personally.  EKG: Independently reviewed.   Assessment/Plan Principal Problem:   Patella fracture Active Problems:   Arthritis   Hypertension   Osteopenia   Right patellar fracture with 13 mm distraction - Secondary to fall and trauma.  Patient due to pain and discomfort.  Will need PT/OT.  Orthopedic, Dr. Christell [facility] has been consulted- will see the patient tomorrow.  Pain control, bowel regimen.  Check vitamin D levels.  Essential hypertension - Resume home medications.  IV as needed ordered  Hyperlipidemia -  Statin    DVT prophylaxis: Lovenox Code Status: Full Family Communication: Son at bedside Consults called: Orthopedic called by ED provider, [facility]-Dr. Christell Constant Admission status: Observation  Status is: Observation The patient remains OBS appropriate and will d/c before 2 midnights.   Time Spent: 65 minutes.  >50% of the time was devoted to discussing the patients care, assessment, plan and disposition with other care givers along with counseling the patient about the risks and benefits of treatment.    Tresa Jolley Joline Maxcy MD [facility]  If 7PM-7AM, please contact night-coverage   09/07/2022, 5:31 PM

## 2022-09-07 NOTE — ED Provider Notes (Signed)
[facility]EM[facility]GENCY DEPT Provider Note   CSN: [id number] Arrival date & time: 09/07/22  1132     History  Chief Complaint  Patient presents with   Danielle Yoder    Danielle Yoder is a 76 y.o. female with Hx of osteopenia, HTN, and hyperlipidemia presenting to the ED due acute right knee pain following a fall this morning.  States she was trying to tie around the house where there was an air mattress on the floor.  Thought the air mattress is worn plated that it was, stepped on it across, and got her foot tangled in it.  Ended up falling and hitting her right knee directly on the hardwood floor.  Denies any other injuries, hitting her head, or loss of consciousness.  Denies numbness or tingling of the leg.  Still able to wiggle her toes.  Significant difficulty bearing weight on the leg.  Denies lightheadedness, dizziness, shortness of breath, chest pain before or after the fall.  The history is provided by the patient and medical records.  Fall     Home Medications Prior to Admission medications   Medication Sig Start Date End Date Taking? Authorizing Provider  atorvastatin (LIPITOR) 40 MG tablet Take 1 tablet (40 mg total) by mouth daily. 02/11/22   [name], MD  Calcium-Vitamin D (CALTRATE 600 PLUS-VIT D PO) Take by mouth.    [provider]  hydrochlorothiazide (MICROZIDE) 12.5 MG capsule Take 1 capsule (12.5 mg total) by mouth daily. 02/11/22   [name], MD  ibuprofen (ADVIL,MOTRIN) 200 MG tablet Take 400 mg by mouth every 6 (six) hours as needed.    [provider]  verapamil (CALAN-SR) 240 MG CR tablet Take 1 tablet (240 mg total) by mouth daily. 02/11/22   [name], MD      Allergies    Patient has no known allergies.    Review of Systems   Review of Systems  Musculoskeletal:  Positive for joint swelling.    Physical Exam Updated Vital Signs BP (!) 164/87   Pulse 62   Temp 98.4 F (36.9 C)   Resp 16   SpO2 100%   Physical Exam Vitals and nursing note reviewed.  Constitutional:      General: She is not in acute distress.    Appearance: She is well-developed.  HENT:     Head: Normocephalic and atraumatic.  Eyes:     Conjunctiva/sclera: Conjunctivae normal.  Cardiovascular:     Rate and Rhythm: Normal rate and regular rhythm.     Heart sounds: No murmur heard. Pulmonary:     Effort: Pulmonary effort is normal. No respiratory distress.     Breath sounds: Normal breath sounds.  Abdominal:     Palpations: Abdomen is soft.     Tenderness: There is no abdominal tenderness.  Musculoskeletal:        General: Swelling, tenderness, deformity and signs of injury present.     Cervical back: Neck supple.     Comments: See photos.  ROM of right extremity deferred due to suspected fracture.  Obvious bony deformity with present.  Skin:    General: Skin is warm and dry.     Capillary Refill: Capillary refill takes less than 2 seconds.  Neurological:     Mental Status: She is alert.  Psychiatric:        Mood and Affect: Mood normal.     ED Results / Procedures / Treatments   Labs (all labs ordered  are listed, but only abnormal results are displayed) Labs Reviewed - No data to display  EKG None  Radiology DG Knee Complete 4 Views Right  Result Date: 09/07/2022 CLINICAL DATA:  Status post fall, right knee pain EXAM: RIGHT KNEE - COMPLETE 4+ VIEW COMPARISON:  03/19/2020 FINDINGS: Comminuted fracture of the patella with 13 mm of distraction and the fracture involving the articular surface. Small joint effusion. No other acute fracture or dislocation. No aggressive osseous lesion. Normal alignment. Soft tissue are unremarkable. No radiopaque foreign body or soft tissue emphysema. IMPRESSION: 1. Comminuted fracture of the patella with 13 mm of distraction and the fracture involving the articular surface. Electronically Signed   By: Elige Ko M.D.   On: 09/07/2022 12:56    Procedures Procedures     Medications Ordered in ED Medications  oxyCODONE-acetaminophen (PERCOCET/ROXICET) 5-325 MG per tablet 1 tablet (1 tablet Oral Given 09/07/22 1347)    ED Course/ Medical Decision Making/ A&P Clinical Course as of 09/07/22 1555  Tue Sep 07, 2022  1438 Called and spoke with Dr. Christell Constant of [facility]'s PA, discussed patient case.  Plans to discuss with Dr. Christell Constant and call back with recommendations. [AC]  1515 Patients primary ortho is Dr. Lajoyce Corners at [facility] [AS]  1516 Mechanical fall over air mattress 2-3 hours PTA. Neurovascular status intact, awaiting call back from Ortho [AS]    Clinical Course User Index [AC] Cecil Cobbs, PA-C [AS] Schutt, Edsel Petrin, PA-C                           Medical Decision Making Amount and/or Complexity of Data Reviewed Radiology: ordered.  Risk Prescription drug management.   76 y.o. female presents to the ED for concern of Fall     This involves an extensive number of treatment options, and is a complaint that carries with it a high risk of complications and morbidity.  The emergent differential diagnosis prior to evaluation includes, but is not limited to: Fracture, contusion, dislocation  This is not an exhaustive differential.   Past Medical History / Co-morbidities / Social History: Hx of osteopenia, HTN, hyperlipidemia Social Determinants of Health include: Geriatric  Additional History:  None  Lab Tests: None  Imaging Studies: I ordered imaging studies including XR right knee.   I independently visualized and interpreted imaging which showed comminuted fracture of the patella with 13 mm of distraction and the fracture involving the articular surface I agree with the radiologist interpretation.  ED Course / Critical Interventions: Pt well-appearing on exam.  Presenting to the ED with acute right knee pain.  Mechanical fall, right knee landed on the hardwood floor.  Difficulty bearing weight.  Obvious bony deformity on exam.   Followed by orthopedic specialist Dr. Lajoyce Corners of [facility]. Pt X-Ray with closed comminuted fracture of the patella, noted 13 mm of distraction. Extremity appears neurovascularly intact.  No evidence of ischemia or compartment syndrome.  Pain managed in ED.  Knee immobilizer ordered.  After administration, extremity rechecked and remains neurovascularly intact.  Orthopedic consultation pending.    Disposition: 1530  care of Prudencio Burly Adcock transferred to [facility] at the end of my shift as the patient will require reassessment once labs/imaging have resulted.  Patient presentation, ED course, and plan of care discussed with review of all pertinent labs and imaging.  Please see his/her note for further details regarding further ED course and disposition.   Plan at time of handoff pending  orthopedic recommendations, likely either admit for surgery vs knee immobilizer with close ortho follow up.  This may be altered or completely changed at the discretion of the oncoming team pending results of further workup.  I discussed this case with my attending, Dr. Tomi Bamberger, who agreed with the proposed treatment course and cosigned this note.  Attending physician stated agreement with plan or made changes to plan which were implemented.    This chart was dictated using voice recognition software.  Despite best efforts to proofread, errors can occur which can change the documentation meaning.         Final Clinical Impression(s) / ED Diagnoses Final diagnoses:  Unspecified fracture of right patella, initial encounter for closed fracture    Rx / DC Orders ED Discharge Orders     None         Candace Cruise [id number]    Dorie Rank, MD 09/08/22 1028

## 2022-09-07 NOTE — ED Triage Notes (Signed)
Pt arrived via EMS, from home, mechanical fall, unable to bear weight or straight leg. Right knee pain

## 2022-09-07 NOTE — Discharge Instructions (Addendum)
Orthopedic Surgery Discharge Instructions  Patient name: Danielle Yoder [facility] Procedure Performed: right knee partial patellectomy Date of Surgery: 09/08/2022 Surgeon: Willia Craze, MD  Fracture Diagnosis: right patella fracture  Activity: You should remain in the brace that keeps your knee locked in a straight position. You can weight bear as tolerated on the right lower extremity. You need to keep the brace on at all times. You can walk as much as you would like. Avoid bending at the knee.   Incision Care: Your incision site has a dressing over it. That dressing should remain in place and dry at all times for the first two weeks after surgery. At your office visit, the dressing will be taken off and the sutures removed. You should sponge bath for the first two weeks or do what you need to do to keep those dressings dry at all times. There may be some bloody drainage from the incision into the dressing after surgery. This is normal. You do not need to replace the dressing. Continue to leave it in place for the two weeks as instructed above. Should the dressing become saturated with blood or drainage, please call the office for further instructions.   Medications: You have been prescribed oxycodone. This is a narcotic pain medication and should only be taken as prescribed. You should not drink alcohol or operate heavy machinery (including driving) while taking this medication. The oxycodone can cause constipation as a side effect. For that reason, you have been prescribed senna. This is a laxative. You do not need to take this medication if you develop diarrhea. Should you remain constipated even while taking the senna, please use over-the-counter miralax as instructed on the packaging to promote regular bowel movements. Tylenol has been prescribed to be taken every 8 hours, which will give you additional pain relief. Finally, you have been prescribed 81mg  aspirin to be taken twice daily. This is being  given to you to prevent blood clots after surgery. You will need to take this for the first six weeks after surgery.   Do not take NSAIDs (ibuprofen, Aleve, Celebrex, naproxen, meloxicam, etc.) while using the aspirin I have prescribed to thin your blood.   In order to set expectations for opioid prescriptions, you will only be prescribed opioids for a total of six weeks after surgery and, at two-weeks after surgery, your opioid prescription will start to tapered (decreased dosage and number of pills). If you have ongoing need for opioid medication six weeks after surgery, you will be referred to pain management. If you are already established with a provider that is giving you opioid medications, you should schedule an appointment with them for six weeks after surgery if you feel you are going to need another prescription. State law only allows for opioid prescriptions one week at a time. If you are running out of opioid medication near the end of the week, please call the office during business hours before running out so I can send you another prescription.   Driving: You should not drive while taking narcotic pain medications. In your case, it will likely be six weeks before you can start driving which is when you will start being allowed to do more range of motion at the knee.   Diet: You are safe to resume your regular diet after surgery.   Reasons to Call the Office After Surgery: You should feel free to call the office with any concerns or questions you have in the post-operative period, but you  should definitely notify the office if you develop: -shortness of breath, chest pain, or trouble breathing -excessive bleeding, drainage, redness, or swelling around the surgical site -fevers, chills, or pain that is getting worse with each passing day -persistent nausea or vomiting -new weakness in the leg, new or worsening numbness or tingling in the leg -other concerns about your surgery  Follow  Up Appointments: You should be seen 15-21 days after surgery. You should call the office to schedule this appointment that at a time/date that is convenient for you.   Office Information:  -Dr. Willia Craze -Phone number: [phone] -Address: [address]       [place], [place] [address]

## 2022-09-07 NOTE — Telephone Encounter (Signed)
James call and stated that pt fell and has been sitting at [facility] ER with apparent fractures. waiting on ortho. He states pt fell on knee and it is very swollen. He was advised she has multiple fractures and he is very concerned because at this point they haven't iced it or anything.  He wanted to make sure you were aware. Fayrene Fearing can be reached at [phone].

## 2022-09-07 NOTE — ED Provider Notes (Signed)
  Physical Exam  BP (!) 164/87   Pulse 62   Temp 98.4 F (36.9 C)   Resp 16   SpO2 100%   Physical Exam Vitals and nursing note reviewed.  Constitutional:      General: She is not in acute distress.    Appearance: Normal appearance. She is normal weight. She is not ill-appearing.  HENT:     Head: Normocephalic and atraumatic.  Pulmonary:     Effort: Pulmonary effort is normal. No respiratory distress.  Abdominal:     General: Abdomen is flat.  Musculoskeletal:        General: Normal range of motion.     Cervical back: Neck supple.     Comments: Knee immobilizer in place. DP pulse 2+, sensation and ankle ROM intact.   Skin:    General: Skin is warm and dry.  Neurological:     Mental Status: She is alert and oriented to person, place, and time.  Psychiatric:        Mood and Affect: Mood normal.        Behavior: Behavior normal.     Procedures  Procedures  ED Course / MDM   Clinical Course as of 09/07/22 1711  Tue Sep 07, 2022  1438 Called and spoke with Dr. Christell Constant of [facility]'s PA, discussed patient case.  Plans to discuss with Dr. Christell Constant and call back with recommendations. [AC]  1515 Patients primary ortho is Dr. Lajoyce Corners at [facility] [AS]  1516 Mechanical fall over air mattress 2-3 hours PTA. Neurovascular status intact, awaiting call back from Ortho [AS]  1633 Spoke with Ortho Dr. Christell Constant, patient states she is fearful of going home and following up outpatient, Dr. Christell Constant suggests admission to hospitalist and he will operate. [AS]  1657 Spoke with hospitalist Dr. Nelson Chimes who will admit for fracture care [AS]    Clinical Course User Index [AC] Cecil Cobbs, PA-C [AS] Lula Olszewski Edsel Petrin, PA-C   Medical Decision Making Amount and/or Complexity of Data Reviewed Labs: ordered. Radiology: ordered.  Risk Prescription drug management. Decision regarding hospitalization.  Care assumed at shift change from [facility] [place].  Please see her note for full HPI.  In  short, she is a 76 year old female who tripped over an air mattress and landed on her right knee.  X-ray did show comminuted right patella fracture with 13 mm of distraction.  Her neurovascular status is intact.  Current plan is to await orthopedic input in regards to surgical fixation.  1645-spoke with orthopedic surgery Dr. Christell Constant.  Also spoke with patient and husband, they are concerned about patient's wellbeing at home and her ability to ambulate.  Patient will be admitted to hospitalist service and orthopedics will consult for surgery.  1655- Spoke with hospitalist Dr. Nelson Chimes who will admit for surgical consult.  Note: Portions of this report may have been transcribed using voice recognition software. Every effort was made to ensure accuracy; however, inadvertent computerized transcription errors may still be present.     Michelle Piper, PA-C 09/07/22 1713    Vanetta Mulders, MD 09/18/22 [phone]

## 2022-09-08 ENCOUNTER — Encounter (HOSPITAL_COMMUNITY): Payer: Self-pay | Admitting: Internal Medicine

## 2022-09-08 ENCOUNTER — Observation Stay (HOSPITAL_COMMUNITY): Payer: Medicare PPO | Admitting: Anesthesiology

## 2022-09-08 ENCOUNTER — Other Ambulatory Visit: Payer: Self-pay

## 2022-09-08 ENCOUNTER — Encounter (HOSPITAL_COMMUNITY)
Admission: EM | Disposition: A | Discharge status: Home / Self Care | Payer: Self-pay | Source: Home / Self Care | Attending: Internal Medicine

## 2022-09-08 ENCOUNTER — Inpatient Hospital Stay (HOSPITAL_COMMUNITY): Payer: Medicare PPO

## 2022-09-08 DIAGNOSIS — J310 Chronic rhinitis: Secondary | ICD-10-CM | POA: Diagnosis not present

## 2022-09-08 DIAGNOSIS — I10 Essential (primary) hypertension: Secondary | ICD-10-CM

## 2022-09-08 DIAGNOSIS — S82041A Displaced comminuted fracture of right patella, initial encounter for closed fracture: Secondary | ICD-10-CM | POA: Diagnosis not present

## 2022-09-08 DIAGNOSIS — Z86010 Personal history of colonic polyps: Secondary | ICD-10-CM | POA: Diagnosis not present

## 2022-09-08 DIAGNOSIS — M1990 Unspecified osteoarthritis, unspecified site: Secondary | ICD-10-CM | POA: Diagnosis not present

## 2022-09-08 DIAGNOSIS — M8580 Other specified disorders of bone density and structure, unspecified site: Secondary | ICD-10-CM | POA: Diagnosis not present

## 2022-09-08 DIAGNOSIS — S82001A Unspecified fracture of right patella, initial encounter for closed fracture: Secondary | ICD-10-CM

## 2022-09-08 DIAGNOSIS — M25561 Pain in right knee: Secondary | ICD-10-CM | POA: Diagnosis present

## 2022-09-08 DIAGNOSIS — Y92009 Unspecified place in unspecified non-institutional (private) residence as the place of occurrence of the external cause: Secondary | ICD-10-CM | POA: Diagnosis not present

## 2022-09-08 DIAGNOSIS — Z01818 Encounter for other preprocedural examination: Secondary | ICD-10-CM | POA: Diagnosis not present

## 2022-09-08 DIAGNOSIS — Y9301 Activity, walking, marching and hiking: Secondary | ICD-10-CM | POA: Diagnosis present

## 2022-09-08 DIAGNOSIS — K5900 Constipation, unspecified: Secondary | ICD-10-CM | POA: Diagnosis not present

## 2022-09-08 DIAGNOSIS — Z8249 Family history of ischemic heart disease and other diseases of the circulatory system: Secondary | ICD-10-CM | POA: Diagnosis not present

## 2022-09-08 DIAGNOSIS — R262 Difficulty in walking, not elsewhere classified: Secondary | ICD-10-CM | POA: Diagnosis not present

## 2022-09-08 DIAGNOSIS — E785 Hyperlipidemia, unspecified: Secondary | ICD-10-CM | POA: Diagnosis not present

## 2022-09-08 DIAGNOSIS — S82009A Unspecified fracture of unspecified patella, initial encounter for closed fracture: Secondary | ICD-10-CM | POA: Diagnosis present

## 2022-09-08 DIAGNOSIS — Z79899 Other long term (current) drug therapy: Secondary | ICD-10-CM | POA: Diagnosis not present

## 2022-09-08 DIAGNOSIS — W01198A Fall on same level from slipping, tripping and stumbling with subsequent striking against other object, initial encounter: Secondary | ICD-10-CM | POA: Diagnosis present

## 2022-09-08 HISTORY — PX: ORIF PATELLA: SHX5033

## 2022-09-08 LAB — CBC
HCT: 37.9 % (ref 36.0–46.0)
Hemoglobin: 11.9 g/dL — ABNORMAL LOW (ref 12.0–15.0)
MCH: 27.2 pg (ref 26.0–34.0)
MCHC: 31.4 g/dL (ref 30.0–36.0)
MCV: 86.5 fL (ref 80.0–100.0)
Platelets: 208 10*3/uL (ref 150–400)
RBC: 4.38 MIL/uL (ref 3.87–5.11)
RDW: 13.7 % (ref 11.5–15.5)
WBC: 7.9 10*3/uL (ref 4.0–10.5)
nRBC: 0 % (ref 0.0–0.2)

## 2022-09-08 LAB — BASIC METABOLIC PANEL
Anion gap: 8 (ref 5–15)
BUN: 14 mg/dL (ref 8–23)
CO2: 21 mmol/L — ABNORMAL LOW (ref 22–32)
Calcium: 9.4 mg/dL (ref 8.9–10.3)
Chloride: 109 mmol/L (ref 98–111)
Creatinine, Ser: 0.72 mg/dL (ref 0.44–1.00)
GFR, Estimated: >60 mL/min (ref >60)
Glucose, Bld: 96 mg/dL (ref 70–99)
Potassium: 4 mmol/L (ref 3.5–5.1)
Sodium: 138 mmol/L (ref 135–145)

## 2022-09-08 LAB — SURGICAL PCR SCREEN
MRSA, PCR: NEGATIVE
Staphylococcus aureus: NEGATIVE

## 2022-09-08 LAB — MAGNESIUM: Magnesium: 1.7 mg/dL (ref 1.7–2.4)

## 2022-09-08 SURGERY — OPEN REDUCTION INTERNAL FIXATION (ORIF) PATELLA
Anesthesia: General | Site: Knee | Laterality: Right

## 2022-09-08 MED ORDER — CEFAZOLIN SODIUM-DEXTROSE 2-4 GM/100ML-% IV SOLN
2.0000 g | Freq: Three times a day (TID) | INTRAVENOUS | Status: DC
  Administered 2022-09-08 – 2022-09-10 (×5): 2 g via INTRAVENOUS
  Filled 2022-09-08 (×5): qty 100

## 2022-09-08 MED ORDER — FENTANYL CITRATE (PF) 100 MCG/2ML IJ SOLN
INTRAMUSCULAR | Status: DC | PRN
  Administered 2022-09-08: 25 ug via INTRAVENOUS
  Administered 2022-09-08: 50 ug via INTRAVENOUS
  Administered 2022-09-08: 25 ug via INTRAVENOUS

## 2022-09-08 MED ORDER — SENNOSIDES-DOCUSATE SODIUM 8.6-50 MG PO TABS
2.0000 | ORAL_TABLET | Freq: Every day | ORAL | Status: DC
  Administered 2022-09-09: 2 via ORAL
  Filled 2022-09-08 (×2): qty 2

## 2022-09-08 MED ORDER — ONDANSETRON HCL 4 MG/2ML IJ SOLN
INTRAMUSCULAR | Status: DC | PRN
  Administered 2022-09-08: 4 mg via INTRAVENOUS

## 2022-09-08 MED ORDER — CHLORHEXIDINE GLUCONATE 0.12 % MT SOLN
15.0000 mL | Freq: Once | OROMUCOSAL | Status: AC
  Administered 2022-09-08: 15 mL via OROMUCOSAL

## 2022-09-08 MED ORDER — POLYETHYLENE GLYCOL 3350 17 G PO PACK
17.0000 g | PACK | Freq: Every day | ORAL | Status: DC
  Administered 2022-09-09 – 2022-09-10 (×2): 17 g via ORAL
  Filled 2022-09-08 (×2): qty 1

## 2022-09-08 MED ORDER — CEFAZOLIN SODIUM-DEXTROSE 2-3 GM-%(50ML) IV SOLR
INTRAVENOUS | Status: DC | PRN
  Administered 2022-09-08: 2 g via INTRAVENOUS

## 2022-09-08 MED ORDER — DEXAMETHASONE SODIUM PHOSPHATE 10 MG/ML IJ SOLN
INTRAMUSCULAR | Status: DC | PRN
  Administered 2022-09-08: 8 mg via INTRAVENOUS

## 2022-09-08 MED ORDER — PROPOFOL 10 MG/ML IV BOLUS
INTRAVENOUS | Status: AC
  Filled 2022-09-08: qty 20

## 2022-09-08 MED ORDER — KETOROLAC TROMETHAMINE 30 MG/ML IJ SOLN: 15.0000 mg | Freq: Once | INTRAMUSCULAR | Status: DC | PRN

## 2022-09-08 MED ORDER — HYDROMORPHONE HCL 1 MG/ML IJ SOLN
0.5000 mg | INTRAMUSCULAR | Status: DC | PRN
  Filled 2022-09-08: qty 0.5

## 2022-09-08 MED ORDER — FENTANYL CITRATE PF 50 MCG/ML IJ SOSY
PREFILLED_SYRINGE | INTRAMUSCULAR | Status: AC
  Filled 2022-09-08: qty 1

## 2022-09-08 MED ORDER — ONDANSETRON HCL 4 MG/2ML IJ SOLN: 4.0000 mg | Freq: Once | INTRAMUSCULAR | Status: DC | PRN

## 2022-09-08 MED ORDER — ACETAMINOPHEN 10 MG/ML IV SOLN
INTRAVENOUS | Status: DC | PRN
  Administered 2022-09-08: 1000 mg via INTRAVENOUS

## 2022-09-08 MED ORDER — OXYCODONE HCL 5 MG/5ML PO SOLN: 5.0000 mg | Freq: Once | ORAL | Status: DC | PRN

## 2022-09-08 MED ORDER — LIDOCAINE HCL (PF) 2 % IJ SOLN
INTRAMUSCULAR | Status: AC
  Filled 2022-09-08: qty 5

## 2022-09-08 MED ORDER — FENTANYL CITRATE (PF) 100 MCG/2ML IJ SOLN
INTRAMUSCULAR | Status: AC
  Filled 2022-09-08: qty 2

## 2022-09-08 MED ORDER — CEFAZOLIN SODIUM-DEXTROSE 2-4 GM/100ML-% IV SOLN
INTRAVENOUS | Status: AC
  Filled 2022-09-08: qty 100

## 2022-09-08 MED ORDER — PROPOFOL 10 MG/ML IV BOLUS
INTRAVENOUS | Status: DC | PRN
  Administered 2022-09-08: 120 mg via INTRAVENOUS

## 2022-09-08 MED ORDER — GLYCOPYRROLATE PF 0.2 MG/ML IJ SOSY
PREFILLED_SYRINGE | INTRAMUSCULAR | Status: DC | PRN
  Administered 2022-09-08: .2 mg via INTRAVENOUS

## 2022-09-08 MED ORDER — FENTANYL CITRATE PF 50 MCG/ML IJ SOSY
25.0000 ug | PREFILLED_SYRINGE | INTRAMUSCULAR | Status: DC | PRN
  Administered 2022-09-08: 25 ug via INTRAVENOUS

## 2022-09-08 MED ORDER — ACETAMINOPHEN 10 MG/ML IV SOLN
INTRAVENOUS | Status: AC
  Filled 2022-09-08: qty 100

## 2022-09-08 MED ORDER — OXYCODONE HCL 5 MG PO TABS: 5.0000 mg | ORAL_TABLET | Freq: Once | ORAL | Status: DC | PRN

## 2022-09-08 MED ORDER — LACTATED RINGERS IV SOLN: INTRAVENOUS | Status: DC

## 2022-09-08 MED ORDER — OXYCODONE HCL 5 MG PO TABS
5.0000 mg | ORAL_TABLET | ORAL | Status: DC | PRN
  Administered 2022-09-08 – 2022-09-09 (×2): 10 mg via ORAL
  Administered 2022-09-09 – 2022-09-10 (×5): 5 mg via ORAL
  Filled 2022-09-08 (×3): qty 1
  Filled 2022-09-08 (×2): qty 2
  Filled 2022-09-08 (×2): qty 1

## 2022-09-08 MED ORDER — 0.9 % SODIUM CHLORIDE (POUR BTL) OPTIME
TOPICAL | Status: DC | PRN
  Administered 2022-09-08: 1000 mL

## 2022-09-08 MED ORDER — LIDOCAINE 2% (20 MG/ML) 5 ML SYRINGE
INTRAMUSCULAR | Status: DC | PRN
  Administered 2022-09-08: 80 mg via INTRAVENOUS

## 2022-09-08 SURGICAL SUPPLY — 55 items
BAG COUNTER SPONGE SURGICOUNT (BAG) IMPLANT
BAG SPEC THK2 15X12 ZIP CLS (MISCELLANEOUS) ×1
BAG SPNG CNTER NS LX DISP (BAG)
BAG ZIPLOCK 12X15 (MISCELLANEOUS) ×1 IMPLANT
BANDAGE ESMARK 6X9 LF (GAUZE/BANDAGES/DRESSINGS) ×1 IMPLANT
BNDG CMPR 5X6 CHSV STRCH STRL (GAUZE/BANDAGES/DRESSINGS) ×1
BNDG CMPR 9X6 STRL LF SNTH (GAUZE/BANDAGES/DRESSINGS) ×1
BNDG CMPR MED 10X6 ELC LF (GAUZE/BANDAGES/DRESSINGS) ×1
BNDG COHESIVE 6X5 TAN ST LF (GAUZE/BANDAGES/DRESSINGS) ×1 IMPLANT
BNDG ELASTIC 6X10 VLCR STRL LF (GAUZE/BANDAGES/DRESSINGS) IMPLANT
BNDG ESMARK 6X9 LF (GAUZE/BANDAGES/DRESSINGS) ×1
BNDG GAUZE DERMACEA FLUFF 4 (GAUZE/BANDAGES/DRESSINGS) ×1 IMPLANT
BNDG GZE DERMACEA 4 6PLY (GAUZE/BANDAGES/DRESSINGS) ×1
COVER SURGICAL LIGHT HANDLE (MISCELLANEOUS) ×1 IMPLANT
CUFF TOURN SGL QUICK 34 (TOURNIQUET CUFF) ×1
CUFF TRNQT CYL 34X4.125X (TOURNIQUET CUFF) ×1 IMPLANT
DRAPE C-ARM 42X120 X-RAY (DRAPES) IMPLANT
DRAPE U-SHAPE 47X51 STRL (DRAPES) ×1 IMPLANT
DRSG EMULSION OIL 3X16 NADH (GAUZE/BANDAGES/DRESSINGS) ×1 IMPLANT
DURAPREP 26ML APPLICATOR (WOUND CARE) ×1 IMPLANT
ELECT REM PT RETURN 15FT ADLT (MISCELLANEOUS) ×1 IMPLANT
GAUZE PAD ABD 8X10 STRL (GAUZE/BANDAGES/DRESSINGS) ×1 IMPLANT
GAUZE SPONGE 4X4 12PLY STRL (GAUZE/BANDAGES/DRESSINGS) ×1 IMPLANT
GAUZE XEROFORM 1X8 LF (GAUZE/BANDAGES/DRESSINGS) IMPLANT
GLOVE BIO SURGEON STRL SZ7.5 (GLOVE) ×1 IMPLANT
GLOVE BIO SURGEON STRL SZ8 (GLOVE) ×2 IMPLANT
GLOVE BIOGEL PI IND STRL 7.0 (GLOVE) ×2 IMPLANT
GLOVE BIOGEL PI IND STRL 8 (GLOVE) ×3 IMPLANT
GLOVE SURG SS PI 7.0 STRL IVOR (GLOVE) ×1 IMPLANT
GOWN STRL REUS W/ TWL LRG LVL3 (GOWN DISPOSABLE) ×2 IMPLANT
GOWN STRL REUS W/TWL LRG LVL3 (GOWN DISPOSABLE) ×2
KIT BASIN OR (CUSTOM PROCEDURE TRAY) ×1 IMPLANT
KIT TURNOVER KIT A (KITS) IMPLANT
MANIFOLD NEPTUNE II (INSTRUMENTS) ×1 IMPLANT
PACK TOTAL JOINT (CUSTOM PROCEDURE TRAY) ×1 IMPLANT
PADDING CAST COTTON 6X4 STRL (CAST SUPPLIES) ×2 IMPLANT
PASSER SUT SWANSON 36MM LOOP (INSTRUMENTS) ×1 IMPLANT
PROTECTOR NERVE ULNAR (MISCELLANEOUS) ×1 IMPLANT
SPIKE FLUID TRANSFER (MISCELLANEOUS) ×1 IMPLANT
STRIP CLOSURE SKIN 1/2X4 (GAUZE/BANDAGES/DRESSINGS) ×1 IMPLANT
SUT ETHIBOND NAB CT1 #1 30IN (SUTURE) ×2 IMPLANT
SUT FIBERWIRE #2 38 T-5 BLUE (SUTURE) ×3
SUT MNCRL AB 4-0 PS2 18 (SUTURE) ×1 IMPLANT
SUT STEEL 7 (SUTURE) ×2 IMPLANT
SUT STRATAFIX 0 PDS 27 VIOLET (SUTURE) ×1
SUT VIC AB 0 CT1 27 (SUTURE) ×2
SUT VIC AB 0 CT1 27XBRD ANTBC (SUTURE) ×2 IMPLANT
SUT VIC AB 1 CT1 27 (SUTURE) ×1
SUT VIC AB 1 CT1 27XBRD ANTBC (SUTURE) ×1 IMPLANT
SUT VIC AB 2-0 CT1 27 (SUTURE) ×1
SUT VIC AB 2-0 CT1 TAPERPNT 27 (SUTURE) ×1 IMPLANT
SUTURE FIBERWR #2 38 T-5 BLUE (SUTURE) IMPLANT
SUTURE STRATFX 0 PDS 27 VIOLET (SUTURE) ×1 IMPLANT
TOWEL OR 17X26 10 PK STRL BLUE (TOWEL DISPOSABLE) ×2 IMPLANT
WATER STERILE IRR 1000ML POUR (IV SOLUTION) ×1 IMPLANT

## 2022-09-08 NOTE — Progress Notes (Addendum)
[facility] PROGRESS NOTE   Danielle Yoder UVO:[id number] DOB: [birth year] DOA: 09/07/2022  PCP: [name], MD  Brief History/Interval Summary: Danielle Yoder with medical history significant of essential hypertension, hyperlipidemia, osteoarthritis presented to the hospital with complaints of right knee pain.  Patient had a mechanical fall on the day of admission.  She was found to have a right patella fracture.  She was hospitalized for further management.  Orthopedics was consulted.   Consultants: Orthopedics, Dr. Christell Constant  Procedures: Surgery planned for later today    Subjective/Interval History: Patient states that the pain is reasonably well-controlled.  Denies any shortness of breath.  No history of heart disease or lung disease.    Assessment/Plan:  Right patella fracture Management per orthopedics.  Pain control.     Plan is for surgical intervention later today.  Patient denies any history of heart disease.  Does not have diabetes.  No EKG noted during this admission.  Will order EKG today.  Do not anticipate any further testing prior to surgery if EKG does not show concerning findings.  EKG reviewed.  Nonspecific T wave changes noted.  Patient has not had any symptoms suggestive of cardiac etiology.  No further testing needed at this time.  May proceed to surgery.  Would recommend EKG be repeated by her PCP in the next few weeks in the outpatient setting. Will start PT and OT tomorrow.  Essential hypertension Monitor blood pressures closely.  Verapamil being continued.  Hyperlipidemia Continue statin.   DVT Prophylaxis: Lovenox Code Status: Full code Family Communication: Discussed with patient Disposition Plan: To be determined  Status is: Observation The patient will require care spanning > 2 midnights and should be moved to inpatient because: Surgery required for patella fracture      Medications: Scheduled:  atorvastatin  40 mg Oral Daily    enoxaparin (LOVENOX) injection  40 mg Subcutaneous Q24H   verapamil  240 mg Oral Daily   Continuous:  sodium chloride Danielle mL/hr at 09/08/22 0835   VQQ:VZDGLOVFIEPPI, dextromethorphan-guaiFENesin, hydrALAZINE, ipratropium-albuterol, metoprolol tartrate, ondansetron (ZOFRAN) IV, oxyCODONE, senna-docusate, traZODone  Antibiotics: Anti-infectives (From admission, onward)    None       Objective:  Vital Signs  Vitals:   09/08/22 0153 09/08/22 0530 09/08/22 1132 09/08/22 1140  BP: 123/66 118/63 139/70   Pulse: 63 71 71   Resp: 18 18 16    Temp: 98 F (36.7 C) 98.2 F (36.8 C) 98.7 F (37.1 C)   TempSrc: Oral Oral Oral   SpO2: 98% 99% 98%   Weight:    61.2 kg  Height:    (!) 5" (0.127 m)    Intake/Output Summary (Last 24 hours) at 09/08/2022 1219 Last data filed at 09/08/2022 1011 Gross per 24 hour  Intake 350.01 ml  Output --  Net 350.01 ml   Filed Weights   09/08/22 1140  Weight: 61.2 kg    General appearance: Awake alert.  In no distress Resp: Clear to auscultation bilaterally.  Normal effort Cardio: S1-S2 is normal regular.  No S3-S4.  No rubs murmurs or bruit GI: Abdomen is soft.  Nontender nondistended.  Bowel sounds are present normal.  No masses organomegaly Extremities: Limited range of motion of the right knee Neurologic: Alert and oriented x3.  No focal neurological deficits.    Lab Results:  Data Reviewed: I have personally reviewed following labs and reports of the imaging studies  CBC: Recent Labs  Lab 09/07/22 1711 09/08/22 0840  WBC 6.9  7.9  NEUTROABS 3.8  --   HGB 13.7 11.9*  HCT 43.4 37.9  MCV 86.3 86.5  PLT 234 208    Basic Metabolic Panel: Recent Labs  Lab 09/07/22 1711 09/08/22 0840  NA 139 138  K 3.8 4.0  CL 106 109  CO2 24 21*  GLUCOSE 90 96  BUN 19 14  CREATININE 0.76 0.72  CALCIUM 10.2 9.4  MG  --  1.7    GFR: CrCl cannot be calculated (Unknown ideal weight.).   Coagulation Profile: Recent Labs  Lab  09/07/22 1832  INR 1.1    Cardiac Enzymes: Recent Labs  Lab 09/07/22 1711  CKTOTAL 462*    Thyroid Function Tests: Recent Labs    09/07/22 1832  TSH 1.970     Radiology Studies: DG Knee Complete 4 Views Right  Result Date: 09/07/2022 CLINICAL DATA:  Status post fall, right knee pain EXAM: RIGHT KNEE - COMPLETE 4+ VIEW COMPARISON:  03/19/2020 FINDINGS: Comminuted fracture of the patella with 13 mm of distraction and the fracture involving the articular surface. Small joint effusion. No other acute fracture or dislocation. No aggressive osseous lesion. Normal alignment. Soft tissue are unremarkable. No radiopaque foreign body or soft tissue emphysema. IMPRESSION: 1. Comminuted fracture of the patella with 13 mm of distraction and the fracture involving the articular surface. Electronically Signed   By: Elige Ko M.D.   On: 09/07/2022 12:56       LOS: 0 days   [name]  [facility] Pager on [email or web address]  09/08/2022, 12:19 PM

## 2022-09-08 NOTE — Anesthesia Preprocedure Evaluation (Signed)
Anesthesia Evaluation  Patient identified by MRN, date of birth, ID band Patient awake    Reviewed: Allergy & Precautions, H&P , NPO status , Patient's Chart, lab work & pertinent test results  Airway Mallampati: II  TM Distance: >3 FB Neck ROM: Full    Dental no notable dental hx.    Pulmonary neg pulmonary ROS   Pulmonary exam normal breath sounds clear to auscultation       Cardiovascular hypertension, Normal cardiovascular exam Rhythm:Regular Rate:Normal     Neuro/Psych negative neurological ROS  negative psych ROS   GI/Hepatic negative GI ROS, Neg liver ROS,,,  Endo/Other  negative endocrine ROS    Renal/GU negative Renal ROS  negative genitourinary   Musculoskeletal  (+) Arthritis , Osteoarthritis,    Abdominal   Peds negative pediatric ROS (+)  Hematology negative hematology ROS (+)   Anesthesia Other Findings   Reproductive/Obstetrics negative OB ROS                             Anesthesia Physical Anesthesia Plan  ASA: 2  Anesthesia Plan: General   Post-op Pain Management: Ofirmev IV (intra-op)*   Induction: Intravenous  PONV Risk Score and Plan: 3 and Ondansetron, Dexamethasone and Treatment may vary due to age or medical condition  Airway Management Planned: LMA  Additional Equipment:   Intra-op Plan:   Post-operative Plan: Extubation in OR  Informed Consent: I have reviewed the patients History and Physical, chart, labs and discussed the procedure including the risks, benefits and alternatives for the proposed anesthesia with the patient or authorized representative who has indicated his/her understanding and acceptance.     Dental advisory given  Plan Discussed with: CRNA and Surgeon  Anesthesia Plan Comments:        Anesthesia Quick Evaluation

## 2022-09-08 NOTE — Plan of Care (Signed)
  Problem: Activity: Goal: Risk for activity intolerance will decrease Outcome: Progressing   Problem: Pain Managment: Goal: General experience of comfort will improve Outcome: Progressing   Problem: Safety: Goal: Ability to remain free from injury will improve Outcome: Progressing   

## 2022-09-08 NOTE — Brief Op Note (Signed)
09/08/2022  8:48 PM  PATIENT:  Danielle Yoder  76 y.o. female  PRE-OPERATIVE DIAGNOSIS:  right patellar fracture  POST-OPERATIVE DIAGNOSIS:  right patellar fracture  PROCEDURE:  right partial patellectomy  SURGEON:  Surgeon(s) and Role:    London Sheer, MD - Primary  PHYSICIAN ASSISTANT:   ASSISTANTS: none   ANESTHESIA:   general  EBL:  100 mL   BLOOD ADMINISTERED:none  DRAINS: none   LOCAL MEDICATIONS USED:  NONE  SPECIMEN:  No Specimen  DISPOSITION OF SPECIMEN:  N/A  COUNTS:  YES  TOURNIQUET:  None used  DICTATION: .Note written in EPIC  PLAN OF CARE: Admit to inpatient   PATIENT DISPOSITION:  PACU - hemodynamically stable.   Delay start of Pharmacological VTE agent (>24hrs) due to surgical blood loss or risk of bleeding: no

## 2022-09-08 NOTE — Progress Notes (Signed)
Report called and given to Tu, RN on 3 west. Pt to be transferred in bed momentarily.

## 2022-09-08 NOTE — Progress Notes (Signed)
OT Cancellation Note  Patient Details Name: Danielle Yoder MRN: [id number] DOB: 03-31-1946   Cancelled Treatment:    Reason Eval/Treat Not Completed: Medical issues which prohibited therapy Per secure chat, MD asking for therapy to hold today with patient pending surgery. OT to continue to follow and check back on 12/28.  Rosalio Loud, MS Acute Rehabilitation Department Office# [phone]  09/08/2022, 9:06 AM

## 2022-09-08 NOTE — Anesthesia Postprocedure Evaluation (Signed)
Anesthesia Post Note  Patient: Danielle Yoder  Procedure(s) Performed: OPEN REDUCTION INTERNAL FIXATION (ORIF) PATELLA (Right: Knee)     Patient location during evaluation: PACU Anesthesia Type: General Level of consciousness: awake and alert Pain management: pain level controlled Vital Signs Assessment: post-procedure vital signs reviewed and stable Respiratory status: spontaneous breathing, nonlabored ventilation, respiratory function stable and patient connected to nasal cannula oxygen Cardiovascular status: blood pressure returned to baseline and stable Postop Assessment: no apparent nausea or vomiting Anesthetic complications: no  No notable events documented.  Last Vitals:  Vitals:   09/08/22 2115 09/08/22 2130  BP: (!) 147/74 (!) 160/75  Pulse: 78 77  Resp: 19 20  Temp: 36.8 C   SpO2: 93% 98%    Last Pain:  Vitals:   09/08/22 2130  TempSrc:   PainSc: 0-No pain                 Latessa Tillis S

## 2022-09-08 NOTE — Transfer of Care (Signed)
Immediate Anesthesia Transfer of Care Note  Patient: Caylah Plouff [place]  Procedure(s) Performed: OPEN REDUCTION INTERNAL FIXATION (ORIF) PATELLA (Right: Knee)  Patient Location: PACU  Anesthesia Type:General  Level of Consciousness: awake, alert , oriented, and patient cooperative  Airway & Oxygen Therapy: Patient Spontanous Breathing and Patient connected to face mask oxygen  Post-op Assessment: Report given to RN and Post -op Vital signs reviewed and stable  Post vital signs: Reviewed and stable  Last Vitals:  Vitals Value Taken Time  BP 158/66 09/08/22 2050  Temp 37.5 C 09/08/22 2050  Pulse 82 09/08/22 2052  Resp 13 09/08/22 2052  SpO2 100 % 09/08/22 2052  Vitals shown include unvalidated device data.  Last Pain:  Vitals:   09/08/22 1618  TempSrc:   PainSc: 5       Patients Stated Pain Goal: 2 (09/08/22 1618)  Complications: No notable events documented.

## 2022-09-08 NOTE — Consult Note (Signed)
Orthopedic Surgery H&P Note  Assessment: Patient is a 76 y.o. female with right displaced patella fracture   Plan: -Planning for ORIF versus partial patellectomy tonight -Can eat until 9am, then NPO since surgery will be at 5pm or later -DVT ppx: per primary, aspirin 81mg  BID will be prescribed at discharge -Antibiotics: ancef on call to OR -Weight bearing status: NWB RLE in knee immobilizer -PT/OT evaluate and treat post-operatively -Pain control -Dispo: pending completion of operative plans   Discussed recommendation for operative intervention in the form of ORIF versus partial patellectomy. I explained that it will be based on how much comminution is seen intra-operatively. If there is more comminution than I see on XR then I will perform a partial patellectomy. Explained the risks of this procedure included, but were not limited to: nonunion, malunion, hardware failure, infection, extensor lag, bleeding, stiffness, post-traumatic arthritis, need for additional procedures, deep vein thrombosis, pulmonary embolism, and death. The benefits of this procedure would be to promote fracture healing by providing stability, to reconstruct the extensor mechanism, and to heal the fracture in the appropriate alignment. The alternatives of this surgery would be to treat the fracture with immobilization in a knee immobilizer or to do no intervention. The patient's questions were answered to their satisfaction. After this discussion, patient elected to proceed with surgery. Informed consent was obtained.   ___________________________________________________________________________   Chief complaint: right knee pain  History:  Patient is a 76 y.o. female who tripped over an air mattress yesterday (12/26) while at home. She landed on her right knee. Noted immediate onset of pain. Patient was unable to ambulate. She only has pain in her right knee. No pain elsewhere. Denies paresthesias and numbness.    Review of systems: General: denies fevers and chills, myalgias Neurologic: denies recent changes in vision, slurred speech Abdomen: denies nausea, vomiting, hematemesis Respiratory: denies cough, shortness of breath  Past medical history:  Rhinitis Osteoarthritis Bunion Dyslipidemia HTN Hemorrhoids  Allergies: NKDA   Past surgical history:  Bilateral foot surgery   Social history: Denies use of nicotine-containing products (cigarettes, vaping, smokeless, etc.) Alcohol use: denies Denies use of recreational drugs  Family history: -reviewed and not pertinent   Physical Exam:  General: no acute distress, appears stated age Neurologic: alert, answering questions appropriately, following commands Cardiovascular: regular rate, no cyanosis Respiratory: unlabored breathing on room air, symmetric chest rise Psychiatric: appropriate affect, normal cadence to speech  MSK:   -Bilateral upper extremities  No tenderness to palpation over extremity, no gross deformity Fires deltoid, biceps, triceps, wrist extensors, wrist flexors, finger extensors, finger flexors  AIN/PIN/IO intact  Palpable radial pulse  Sensation intact to light touch in median/ulnar/radial/axillary nerve distributions  Hand warm and well perfused  -Bilateral lower extremities  No tenderness to palpation over extremity, except over the right knee Fires hip flexors, quadriceps, hamstrings, tibialis anterior, gastrocnemius and soleus, extensor hallucis longus (except on right side she does not fire hamstrings and quads due to pain around the knee) Plantarflexes and dorsiflexes toes Sensation intact to light touch in sural, saphenous, tibial, deep peroneal, and superficial peroneal nerve distributions Foot warm and well perfused  Imaging: XR of the right from 09/07/2022 was independently reviewed and interpreted, showing displaced transverse patella fracture with comminution   Patient name: Danielle Yoder Patient MRN: Cloyd Stagers Date: @TODAY @

## 2022-09-08 NOTE — Anesthesia Procedure Notes (Signed)
Procedure Name: LMA Insertion Date/Time: 09/08/2022 6:18 PM  Performed by: Epimenio Sarin, CRNAPre-anesthesia Checklist: Patient identified, Emergency Drugs available, Suction available, Patient being monitored and Timeout performed Patient Re-evaluated:Patient Re-evaluated prior to induction Oxygen Delivery Method: Circle system utilized Preoxygenation: Pre-oxygenation with 100% oxygen Induction Type: IV induction Ventilation: Mask ventilation without difficulty LMA: LMA inserted LMA Size: 4.0 Number of attempts: 1 Dental Injury: Teeth and Oropharynx as per pre-operative assessment

## 2022-09-08 NOTE — Plan of Care (Signed)
Patient Danielle Yoder, VSS throughout shift.  All meds given on time as ordered.  Pt c/o right leg pain relieved by PRN 10mg  oxycodone.  Leg strap remains in place on right leg.  Pt voided in bedpan.  PIV dressing changed.  Diminished lungs, IS taught and encouraged.  POC maintained, will continue to monitor.  Problem: Education: Goal: Knowledge of General Education information will improve Description: Including pain rating scale, medication(s)/side effects and non-pharmacologic comfort measures Outcome: Progressing   Problem: Health Behavior/Discharge Planning: Goal: Ability to manage health-related needs will improve Outcome: Progressing   Problem: Clinical Measurements: Goal: Ability to maintain clinical measurements within normal limits will improve Outcome: Progressing Goal: Will remain free from infection Outcome: Progressing Goal: Diagnostic test results will improve Outcome: Progressing Goal: Respiratory complications will improve Outcome: Progressing Goal: Cardiovascular complication will be avoided Outcome: Progressing   Problem: Activity: Goal: Risk for activity intolerance will decrease Outcome: Progressing   Problem: Nutrition: Goal: Adequate nutrition will be maintained Outcome: Progressing   Problem: Coping: Goal: Level of anxiety will decrease Outcome: Progressing   Problem: Elimination: Goal: Will not experience complications related to bowel motility Outcome: Progressing Goal: Will not experience complications related to urinary retention Outcome: Progressing   Problem: Pain Managment: Goal: General experience of comfort will improve Outcome: Progressing   Problem: Safety: Goal: Ability to remain free from injury will improve Outcome: Progressing   Problem: Skin Integrity: Goal: Risk for impaired skin integrity will decrease Outcome: Progressing

## 2022-09-09 ENCOUNTER — Encounter (HOSPITAL_COMMUNITY): Payer: Self-pay | Admitting: Orthopedic Surgery

## 2022-09-09 DIAGNOSIS — S82001A Unspecified fracture of right patella, initial encounter for closed fracture: Secondary | ICD-10-CM | POA: Diagnosis not present

## 2022-09-09 DIAGNOSIS — I10 Essential (primary) hypertension: Secondary | ICD-10-CM | POA: Diagnosis not present

## 2022-09-09 DIAGNOSIS — S82041A Displaced comminuted fracture of right patella, initial encounter for closed fracture: Secondary | ICD-10-CM | POA: Diagnosis not present

## 2022-09-09 LAB — CBC
HCT: 36.2 % (ref 36.0–46.0)
Hemoglobin: 11.4 g/dL — ABNORMAL LOW (ref 12.0–15.0)
MCH: 27.3 pg (ref 26.0–34.0)
MCHC: 31.5 g/dL (ref 30.0–36.0)
MCV: 86.6 fL (ref 80.0–100.0)
Platelets: 195 10*3/uL (ref 150–400)
RBC: 4.18 MIL/uL (ref 3.87–5.11)
RDW: 13.6 % (ref 11.5–15.5)
WBC: 8.9 10*3/uL (ref 4.0–10.5)
nRBC: 0 % (ref 0.0–0.2)

## 2022-09-09 LAB — BASIC METABOLIC PANEL
Anion gap: 9 (ref 5–15)
BUN: 12 mg/dL (ref 8–23)
CO2: 22 mmol/L (ref 22–32)
Calcium: 9.4 mg/dL (ref 8.9–10.3)
Chloride: 108 mmol/L (ref 98–111)
Creatinine, Ser: 0.8 mg/dL (ref 0.44–1.00)
GFR, Estimated: >60 mL/min (ref >60)
Glucose, Bld: 151 mg/dL — ABNORMAL HIGH (ref 70–99)
Potassium: 3.8 mmol/L (ref 3.5–5.1)
Sodium: 139 mmol/L (ref 135–145)

## 2022-09-09 LAB — MAGNESIUM: Magnesium: 1.5 mg/dL — ABNORMAL LOW (ref 1.7–2.4)

## 2022-09-09 MED ORDER — ASPIRIN 81 MG PO TBEC: 81.0000 mg | DELAYED_RELEASE_TABLET | Freq: Two times a day (BID) | ORAL | 0 refills | Status: AC

## 2022-09-09 MED ORDER — ACETAMINOPHEN 500 MG PO TABS: 1000.0000 mg | ORAL_TABLET | Freq: Three times a day (TID) | ORAL | 0 refills | Status: AC

## 2022-09-09 MED ORDER — OXYCODONE HCL 5 MG PO TABS: 5.0000 mg | ORAL_TABLET | ORAL | 0 refills | Status: DC | PRN

## 2022-09-09 MED ORDER — MAGNESIUM SULFATE 2 GM/50ML IV SOLN
2.0000 g | Freq: Once | INTRAVENOUS | Status: AC
  Administered 2022-09-09: 2 g via INTRAVENOUS
  Filled 2022-09-09: qty 50

## 2022-09-09 MED ORDER — SENNA 8.6 MG PO TABS: 1.0000 | ORAL_TABLET | Freq: Two times a day (BID) | ORAL | 0 refills | Status: AC

## 2022-09-09 NOTE — Progress Notes (Signed)
Orthopedic Tech Progress Note Patient Details:  [name] [facility] [birth year] [id number]  Patient ID: Danielle Yoder, female   DOB: 1946-05-29, 76 y.o.   MRN: [id number]  Danielle Yoder 09/09/2022, 9:46 AM Bledsoe brace ordered from [facility]

## 2022-09-09 NOTE — Progress Notes (Signed)
Physical Therapy Treatment Patient Details Name: Danielle Yoder MRN: [id number] DOB: [birth year] Today's Date: 09/09/2022   History of Present Illness Pt s/p fall with R patellar fx and now s/p ORIF.    PT Comments    Pt tolerated pm session well. Good recall on education provided from am session regarding proper transfer techniques, gait sequencing, and safety awareness. 4/10 R knee discomfort with mobility. Will continue to progress and train on stair negotiation prior to returning home with assist from family.   Recommendations for follow up therapy are one component of a multi-disciplinary discharge planning process, led by the attending physician.  Recommendations may be updated based on patient status, additional functional criteria and insurance authorization.  Follow Up Recommendations  No PT follow up     Assistance Recommended at Discharge Intermittent Supervision/Assistance  Patient can return home with the following A little help with walking and/or transfers;A little help with bathing/dressing/bathroom;Assistance with cooking/housework;Assist for transportation;Help with stairs or ramp for entrance   Equipment Recommendations  Rolling walker (2 wheels)    Recommendations for Other Services OT consult     Precautions / Restrictions Precautions Precautions: Fall Precaution Comments: No ROM at R knee Required Braces or Orthoses:  (Locked hinge knee brace) Knee Immobilizer - Right: On at all times Restrictions Weight Bearing Restrictions: No RLE Weight Bearing: Weight bearing as tolerated     Mobility  Bed Mobility Overal bed mobility: Needs Assistance Bed Mobility: Sit to Supine     Supine to sit:  (For R LE) Sit to supine: Min assist (For R LE management)        Transfers Overall transfer level: Needs assistance Equipment used: Rolling walker (2 wheels) Transfers: Sit to/from Stand, Bed to chair/wheelchair/BSC Sit to Stand: Min assist Stand pivot  transfers: Min assist         General transfer comment: cues for LE managment and use of UEs to self assist    Ambulation/Gait Ambulation/Gait assistance: Min guard Gait Distance (Feet): 50 Feet Assistive device: Rolling walker (2 wheels) Gait Pattern/deviations: Step-to pattern, Decreased step length - right, Decreased step length - left, Shuffle Gait velocity: decr     General Gait Details: cues for sequence, posture, position from RW and stride length   Stairs             Wheelchair Mobility    Modified Rankin (Stroke Patients Only)       Balance Overall balance assessment: Needs assistance Sitting-balance support: No upper extremity supported, Feet supported Sitting balance-Leahy Scale: Good     Standing balance support: Single extremity supported Standing balance-Leahy Scale: Fair                              Cognition Arousal/Alertness: Awake/alert Behavior During Therapy: WFL for tasks assessed/performed Overall Cognitive Status: Within Functional Limits for tasks assessed                                          Exercises General Exercises - Lower Extremity Ankle Circles/Pumps: AROM, Both, 15 reps, Supine    General Comments        Pertinent Vitals/Pain Pain Assessment Pain Assessment: 0-10 Pain Score: 4  Pain Location: R knee Pain Descriptors / Indicators: Aching, Sore Pain Intervention(s): Limited activity within patient's tolerance    Home Living Family/patient expects to be discharged  to:: Private residence Living Arrangements: Spouse/significant other Available Help at Discharge: Family;Available 24 hours/day Type of Home: House Home Access: Stairs to enter Entrance Stairs-Rails: Right Entrance Stairs-Number of Steps: 5 Alternate Level Stairs-Number of Steps: flight Home Layout: Two level Home Equipment: Agricultural consultant (2 wheels) (was husbands and likely too high as pt is 5'0")      Prior  Function            PT Goals (current goals can now be found in the care plan section) Acute Rehab PT Goals Patient Stated Goal: regain IND PT Goal Formulation: With patient Time For Goal Achievement: 09/15/22 Potential to Achieve Goals: Good    Frequency    7X/week      PT Plan      Co-evaluation              AM-PAC PT "6 Clicks" Mobility   Outcome Measure  Help needed turning from your back to your side while in a flat bed without using bedrails?: A Little Help needed moving from lying on your back to sitting on the side of a flat bed without using bedrails?: A Little Help needed moving to and from a bed to a chair (including a wheelchair)?: A Little Help needed standing up from a chair using your arms (e.g., wheelchair or bedside chair)?: A Little Help needed to walk in hospital room?: A Little Help needed climbing 3-5 steps with a railing? : A Little 6 Click Score: 18    End of Session Equipment Utilized During Treatment: Gait belt (Right knee brace) Activity Tolerance: Patient tolerated treatment well Patient left: in bed;with call bell/phone within reach   PT Visit Diagnosis: Unsteadiness on feet (R26.81);Difficulty in walking, not elsewhere classified (R26.2)     Time: 6789-3810 PT Time Calculation (min) (ACUTE ONLY): 24 min  Charges:  $Gait Training: 8-22 mins $Therapeutic Activity: 8-22 mins                    Zadie Cleverly, PTA   Jannet Askew 09/09/2022, 3:08 PM

## 2022-09-09 NOTE — Progress Notes (Signed)
[facility] PROGRESS NOTE   Danielle Yoder FMB:[id number] DOB: [birth year] DOA: 09/07/2022  PCP: [name], MD  Brief History/Interval Summary: 76 y.o. female with medical history significant of essential hypertension, hyperlipidemia, osteoarthritis presented to the hospital with complaints of right knee pain.  Patient had a mechanical fall on the day of admission.  She was found to have a right patella fracture.  She was hospitalized for further management.  Orthopedics was consulted.   Consultants: Orthopedics, Dr. Christell Constant  Procedures: Right partial patellectomy 12/27    Subjective/Interval History: Patient mentions that she has about 6 out of 10 pain in the right knee.  Denies any shortness of breath or chest pain.  Feels constipated.  No nausea vomiting.    Assessment/Plan:  Right patella fracture Patient was seen by orthopedics.  Underwent right partial patellectomy me on 12/27. Pain control.  Bowel regimen. PT and OT to evaluate today.    Essential hypertension Continue home med occasions.  She is noted to be on verapamil.  Elevated blood pressure readings could be secondary to pain.  Constipation Bowel regimen initiated.  Hyperlipidemia Continue statin.   DVT Prophylaxis: Lovenox Code Status: Full code Family Communication: Discussed with patient and her husband Disposition Plan: PT and OT to evaluate.  Status is: Inpatient Remains inpatient appropriate because: Right partial patellectomy me, pain issues, ambulatory dysfunction     Medications: Scheduled:  atorvastatin  40 mg Oral Daily   enoxaparin (LOVENOX) injection  40 mg Subcutaneous Q24H   polyethylene glycol  17 g Oral Daily   senna-docusate  2 tablet Oral QHS   verapamil  240 mg Oral Daily   Continuous:   ceFAZolin (ANCEF) IV 2 g (09/09/22 0523)   magnesium sulfate bolus IVPB 2 g (09/09/22 0932)   TSV:[id number], dextromethorphan-guaiFENesin, hydrALAZINE, HYDROmorphone  (DILAUDID) injection, ipratropium-albuterol, metoprolol tartrate, ondansetron (ZOFRAN) IV, oxyCODONE, traZODone  Antibiotics: Anti-infectives (From admission, onward)    Start     Dose/Rate Route Frequency Ordered Stop   09/08/22 2315  ceFAZolin (ANCEF) IVPB 2g/100 mL premix        2 g 200 mL/hr over 30 Minutes Intravenous Every 8 hours 09/08/22 2216     09/08/22 1729  ceFAZolin (ANCEF) 2-4 GM/100ML-% IVPB       Note to Pharmacy: Cyndia Diver V: cabinet override      09/08/22 1729 09/09/22 0544       Objective:  Vital Signs  Vitals:   09/08/22 2200 09/08/22 2221 09/09/22 0120 09/09/22 0613  BP: (!) 136/57 (!) 173/80 (!) 165/69 136/71  Pulse: 76 72 77 73  Resp: 12 16 18 17   Temp: 97.7 F (36.5 C) 98.8 F (37.1 C) 98 F (36.7 C) (!) 97.5 F (36.4 C)  TempSrc:  Oral Oral Oral  SpO2: 94% 96% 98% 97%  Weight:      Height:        Intake/Output Summary (Last 24 hours) at 09/09/2022 0952 Last data filed at 09/09/2022 0932 Gross per 24 hour  Intake 1970 ml  Output 900 ml  Net 1070 ml    Filed Weights   09/08/22 1140  Weight: 61.2 kg    General appearance: Awake alert.  In no distress Resp: Clear to auscultation bilaterally.  Normal effort Cardio: S1-S2 is normal regular.  No S3-S4.  No rubs murmurs or bruit GI: Abdomen is soft.  Nontender nondistended.  Bowel sounds are present normal.  No masses organomegaly Extremities: Right lower extremity in immobilizer Neurologic: Alert and oriented x3.  No focal neurological deficits.    Lab Results:  Data Reviewed: I have personally reviewed following labs and reports of the imaging studies  CBC: Recent Labs  Lab 09/07/22 1711 09/08/22 0840 09/09/22 0422  WBC 6.9 7.9 8.9  NEUTROABS 3.8  --   --   HGB 13.7 11.9* 11.4*  HCT 43.4 37.9 36.2  MCV 86.3 86.5 86.6  PLT 234 208 195     Basic Metabolic Panel: Recent Labs  Lab 09/07/22 1711 09/08/22 0840 09/09/22 0422  NA 139 138 139  K 3.8 4.0 3.8  CL 106  109 108  CO2 24 21* 22  GLUCOSE 90 96 151*  BUN 19 14 12   CREATININE 0.76 0.72 0.80  CALCIUM 10.2 9.4 9.4  MG  --  1.7 1.5*     GFR: Estimated Creatinine Clearance: 49.7 mL/min (by C-G formula based on SCr of 0.8 mg/dL).   Coagulation Profile: Recent Labs  Lab 09/07/22 1832  INR 1.1     Cardiac Enzymes: Recent Labs  Lab 09/07/22 1711  CKTOTAL 462*     Thyroid Function Tests: Recent Labs    09/07/22 1832  TSH 1.970      Radiology Studies: DG Knee 1-2 Views Right  Result Date: 09/08/2022 CLINICAL DATA:  Right patellar fracture repair EXAM: RIGHT KNEE - 1-2 VIEW COMPARISON:  09/07/2022 FINDINGS: 2 fluoroscopic images are obtained during the performance of the procedure and are provided for interpretation only. The lateral image demonstrates apparent resection of the lower pole patellar fracture fragment seen previously. Alignment is grossly anatomic. Please refer to operative report. FLUOROSCOPY TIME:  5 seconds, 2.27 mGy IMPRESSION: 1. Intraoperative evaluation as above. Please refer to operative report. Electronically Signed   By: 09/09/2022 M.D.   On: 09/08/2022 20:31   DG C-Arm 1-60 Min-No Report  Result Date: 09/08/2022 Fluoroscopy was utilized by the requesting physician.  No radiographic interpretation.   DG C-Arm 1-60 Min-No Report  Result Date: 09/08/2022 Fluoroscopy was utilized by the requesting physician.  No radiographic interpretation.   DG Knee Complete 4 Views Right  Result Date: 09/07/2022 CLINICAL DATA:  Status post fall, right knee pain EXAM: RIGHT KNEE - COMPLETE 4+ VIEW COMPARISON:  03/19/2020 FINDINGS: Comminuted fracture of the patella with 13 mm of distraction and the fracture involving the articular surface. Small joint effusion. No other acute fracture or dislocation. No aggressive osseous lesion. Normal alignment. Soft tissue are unremarkable. No radiopaque foreign body or soft tissue emphysema. IMPRESSION: 1. Comminuted fracture  of the patella with 13 mm of distraction and the fracture involving the articular surface. Electronically Signed   By: 05/20/2020 M.D.   On: 09/07/2022 12:56       LOS: 1 day   Danielle Yoder  [facility] Pager on [email or web address]  09/09/2022, 9:52 AM

## 2022-09-09 NOTE — Progress Notes (Signed)
Orthopedic Surgery Progress Note   Assessment: Patient is a 76 y.o. female with right patella fracture status post partial patellectomy   Plan: -Operative plans: complete -Diet: regular -DVT ppx: per primary, prescribed aspirin 81mg  BID at discharge  -Antibiotics: ancef x2 post-op doses -Weight bearing status: as tolerated in knee immobilizer at all times, ordered bledsoe to be locked in extension -PT/OT evaluate and treat -Pain control -Dispo: per primary  ___________________________________________________________________________  Subjective: No acute events overnight. Pain adequately controlled. Was able to mobilize last night to the bathroom with the KI on. Denies paresthesias and numbness.    Physical Exam:  General: no acute distress, appears stated age Neurologic: alert, answering questions appropriately, following commands Respiratory: unlabored breathing on room air, symmetric chest rise Psychiatric: appropriate affect, normal cadence to speech  MSK:   -Right lower extremity  Dressings over knee c/d/i, ice over knee, KI in place EHL/TA/GSC intact Plantarflexes and dorsiflexes toes Sensation intact to light touch in sural, saphenous, tibial, deep peroneal, and superficial peroneal nerve distributions Foot warm and well perfused   Patient name: Danielle Yoder Patient MRN: Cloyd Stagers Date: 09/09/22

## 2022-09-09 NOTE — Evaluation (Signed)
Physical Therapy Evaluation Patient Details Name: Danielle Yoder MRN: [id number] DOB: 1946-01-13 Today's Date: 09/09/2022  History of Present Illness  Pt s/p fall with R patellar fx and now s/p ORIF.  Clinical Impression  Pt admitted as above and presenting with functional mobility limitations 2* decreased R LE strength/ROM and post op pain.  Pt should progress well to dc home with family assist.     Recommendations for follow up therapy are one component of a multi-disciplinary discharge planning process, led by the attending physician.  Recommendations may be updated based on patient status, additional functional criteria and insurance authorization.  Follow Up Recommendations No PT follow up      Assistance Recommended at Discharge Intermittent Supervision/Assistance  Patient can return home with the following  A little help with walking and/or transfers;A little help with bathing/dressing/bathroom;Assistance with cooking/housework;Assist for transportation;Help with stairs or ramp for entrance    Equipment Recommendations Rolling walker (2 wheels) (youth level RW please)  Recommendations for Other Services  OT consult    Functional Status Assessment Patient has had a recent decline in their functional status and demonstrates the ability to make significant improvements in function in a reasonable and predictable amount of time.     Precautions / Restrictions Precautions Precautions: Fall;Other (comment) Precaution Comments: No ROM at R knee Required Braces or Orthoses: Knee Immobilizer - Right Knee Immobilizer - Right: On at all times Restrictions Weight Bearing Restrictions: No RLE Weight Bearing: Weight bearing as tolerated      Mobility  Bed Mobility Overal bed mobility: Needs Assistance Bed Mobility: Supine to Sit     Supine to sit: Min assist     General bed mobility comments: cues for sequence and use of L LE to self assist.  Physical assist to manage R  LE    Transfers Overall transfer level: Needs assistance Equipment used: Rolling walker (2 wheels) Transfers: Sit to/from Stand Sit to Stand: Min assist           General transfer comment: cues for LE managment and use of UEs to self assist    Ambulation/Gait Ambulation/Gait assistance: Min assist, Min guard Gait Distance (Feet): 48 Feet Assistive device: Rolling walker (2 wheels) Gait Pattern/deviations: Step-to pattern, Decreased step length - right, Decreased step length - left, Shuffle, Trunk flexed Gait velocity: decr     General Gait Details: cues for sequence, posture, position from RW and stride length  Stairs            Wheelchair Mobility    Modified Rankin (Stroke Patients Only)       Balance Overall balance assessment: Needs assistance Sitting-balance support: No upper extremity supported, Feet supported Sitting balance-Leahy Scale: Good     Standing balance support: Single extremity supported Standing balance-Leahy Scale: Fair                               Pertinent Vitals/Pain Pain Assessment Pain Assessment: 0-10 Pain Score: 3  Pain Location: R knee Pain Descriptors / Indicators: Aching, Sore Pain Intervention(s): Limited activity within patient's tolerance, Monitored during session, Premedicated before session, Ice applied    Home Living Family/patient expects to be discharged to:: Private residence Living Arrangements: Spouse/significant other Available Help at Discharge: Family;Available 24 hours/day Type of Home: House Home Access: Stairs to enter Entrance Stairs-Rails: Right Entrance Stairs-Number of Steps: 5 Alternate Level Stairs-Number of Steps: flight Home Layout: Two level Home Equipment: Conservation officer, nature (2 wheels) (  was husbands and likely too high as pt is 5'0")      Prior Function Prior Level of Function : Independent/Modified Independent                     Hand Dominance         Extremity/Trunk Assessment   Upper Extremity Assessment Upper Extremity Assessment: Overall WFL for tasks assessed    Lower Extremity Assessment Lower Extremity Assessment: RLE deficits/detail RLE Deficits / Details: KI in place    Cervical / Trunk Assessment Cervical / Trunk Assessment: Normal  Communication   Communication: No difficulties  Cognition Arousal/Alertness: Awake/alert Behavior During Therapy: WFL for tasks assessed/performed Overall Cognitive Status: Within Functional Limits for tasks assessed                                          General Comments      Exercises General Exercises - Lower Extremity Ankle Circles/Pumps: AROM, Both, 15 reps, Supine   Assessment/Plan    PT Assessment Patient needs continued PT services  PT Problem List Decreased strength;Decreased range of motion;Decreased activity tolerance;Decreased balance;Decreased mobility;Decreased knowledge of use of DME;Pain       PT Treatment Interventions DME instruction;Gait training;Stair training;Functional mobility training;Therapeutic activities;Therapeutic exercise;Patient/family education    PT Goals (Current goals can be found in the Care Plan section)  Acute Rehab PT Goals Patient Stated Goal: regain IND PT Goal Formulation: With patient Time For Goal Achievement: 09/15/22 Potential to Achieve Goals: Good    Frequency 7X/week     Co-evaluation               AM-PAC PT "6 Clicks" Mobility  Outcome Measure Help needed turning from your back to your side while in a flat bed without using bedrails?: A Little Help needed moving from lying on your back to sitting on the side of a flat bed without using bedrails?: A Little Help needed moving to and from a bed to a chair (including a wheelchair)?: A Little Help needed standing up from a chair using your arms (e.g., wheelchair or bedside chair)?: A Little Help needed to walk in hospital room?: A Little Help needed  climbing 3-5 steps with a railing? : A Little 6 Click Score: 18    End of Session Equipment Utilized During Treatment: Gait belt;Right knee immobilizer Activity Tolerance: Patient tolerated treatment well Patient left: in chair;with call bell/phone within reach;with chair alarm set   PT Visit Diagnosis: Unsteadiness on feet (R26.81);Difficulty in walking, not elsewhere classified (R26.2)    Time: 2440-1027 PT Time Calculation (min) (ACUTE ONLY): 32 min   Charges:   PT Evaluation $PT Eval Low Complexity: 1 Low PT Treatments $Gait Training: 8-22 mins        Mauro Kaufmann PT Acute Rehabilitation Services Pager [phone] Office [phone]   Terrian Ridlon 09/09/2022, 1:07 PM

## 2022-09-10 DIAGNOSIS — S82001A Unspecified fracture of right patella, initial encounter for closed fracture: Secondary | ICD-10-CM | POA: Diagnosis not present

## 2022-09-10 LAB — BASIC METABOLIC PANEL
Anion gap: 5 (ref 5–15)
BUN: 23 mg/dL (ref 8–23)
CO2: 26 mmol/L (ref 22–32)
Calcium: 9.3 mg/dL (ref 8.9–10.3)
Chloride: 106 mmol/L (ref 98–111)
Creatinine, Ser: 0.9 mg/dL (ref 0.44–1.00)
GFR, Estimated: >60 mL/min (ref >60)
Glucose, Bld: 105 mg/dL — ABNORMAL HIGH (ref 70–99)
Potassium: 4.4 mmol/L (ref 3.5–5.1)
Sodium: 137 mmol/L (ref 135–145)

## 2022-09-10 LAB — CBC
HCT: 34.5 % — ABNORMAL LOW (ref 36.0–46.0)
Hemoglobin: 10.7 g/dL — ABNORMAL LOW (ref 12.0–15.0)
MCH: 27.3 pg (ref 26.0–34.0)
MCHC: 31 g/dL (ref 30.0–36.0)
MCV: 88 fL (ref 80.0–100.0)
Platelets: 185 10*3/uL (ref 150–400)
RBC: 3.92 MIL/uL (ref 3.87–5.11)
RDW: 14.3 % (ref 11.5–15.5)
WBC: 12.1 10*3/uL — ABNORMAL HIGH (ref 4.0–10.5)
nRBC: 0 % (ref 0.0–0.2)

## 2022-09-10 LAB — MAGNESIUM: Magnesium: 2.3 mg/dL (ref 1.7–2.4)

## 2022-09-10 MED ORDER — SENNOSIDES-DOCUSATE SODIUM 8.6-50 MG PO TABS: 2.0000 | ORAL_TABLET | Freq: Two times a day (BID) | ORAL | Status: DC

## 2022-09-10 MED ORDER — POLYETHYLENE GLYCOL 3350 17 G PO PACK: 17.0000 g | PACK | Freq: Two times a day (BID) | ORAL | 0 refills | Status: DC

## 2022-09-10 MED ORDER — POLYETHYLENE GLYCOL 3350 17 G PO PACK: 17.0000 g | PACK | Freq: Two times a day (BID) | ORAL | Status: DC

## 2022-09-10 MED ORDER — OXYCODONE HCL 5 MG PO TABS: 5.0000 mg | ORAL_TABLET | ORAL | 0 refills | Status: AC | PRN

## 2022-09-10 NOTE — Progress Notes (Signed)
Physical Therapy Treatment Patient Details Name: Danielle Yoder MRN: [id number] DOB: April 04, 1946 Today's Date: 09/10/2022   History of Present Illness Pt s/p fall with R patellar fx and now s/p ORIF.    PT Comments    Pt in good spirits, motivated and progressing well with mobility.  Pt up to ambulate increased distance in hall, negotiated stairs, to bathroom for toileting and hygiene at sink, and requiring decreased assist and cues for all tasks.  Pt eager for dc home this date.   Recommendations for follow up therapy are one component of a multi-disciplinary discharge planning process, led by the attending physician.  Recommendations may be updated based on patient status, additional functional criteria and insurance authorization.  Follow Up Recommendations  No PT follow up     Assistance Recommended at Discharge Intermittent Supervision/Assistance  Patient can return home with the following A little help with walking and/or transfers;A little help with bathing/dressing/bathroom;Assistance with cooking/housework;Assist for transportation;Help with stairs or ramp for entrance   Equipment Recommendations  Rolling walker (2 wheels)    Recommendations for Other Services OT consult     Precautions / Restrictions Precautions Precautions: Fall Precaution Comments: No ROM at R knee Knee Immobilizer - Right: On at all times Restrictions Weight Bearing Restrictions: No RLE Weight Bearing: Weight bearing as tolerated     Mobility  Bed Mobility Overal bed mobility: Needs Assistance Bed Mobility: Supine to Sit     Supine to sit: Min assist     General bed mobility comments: cues for sequence and use of L LE to self assist.  Physical assist to manage R LE    Transfers Overall transfer level: Needs assistance Equipment used: Rolling walker (2 wheels) Transfers: Sit to/from Stand Sit to Stand: Min guard, Supervision           General transfer comment: cues for LE  managment and use of UEs to self assist; to/from recliner and [facility]    Ambulation/Gait Ambulation/Gait assistance: Min guard, Supervision Gait Distance (Feet): 100 Feet (and 20' back from bathroom) Assistive device: Rolling walker (2 wheels) Gait Pattern/deviations: Step-to pattern, Decreased step length - right, Decreased step length - left, Shuffle Gait velocity: decr     General Gait Details: cues for sequence, posture, position from RW and stride length   Stairs Stairs: Yes Stairs assistance: Min assist Stair Management: One rail Left, Step to pattern, Forwards, With crutches Number of Stairs: 5 General stair comments: cues for sequence and foot/RW placement   Wheelchair Mobility    Modified Rankin (Stroke Patients Only)       Balance Overall balance assessment: Mild deficits observed, not formally tested Sitting-balance support: No upper extremity supported, Feet supported Sitting balance-Leahy Scale: Good     Standing balance support: No upper extremity supported Standing balance-Leahy Scale: Fair                              Cognition Arousal/Alertness: Awake/alert Behavior During Therapy: WFL for tasks assessed/performed Overall Cognitive Status: Within Functional Limits for tasks assessed                                          Exercises General Exercises - Lower Extremity Ankle Circles/Pumps: AROM, Both, 15 reps, Supine    General Comments        Pertinent Vitals/Pain Pain Assessment Pain  Assessment: 0-10 Pain Score: 2  Pain Location: R knee Pain Descriptors / Indicators: Aching, Sore Pain Intervention(s): Limited activity within patient's tolerance, Monitored during session, Premedicated before session, Ice applied    Home Living Family/patient expects to be discharged to:: Private residence Living Arrangements: Spouse/significant other Available Help at Discharge: Family;Available 24 hours/day Type of Home:  House Home Access: Stairs to enter Entrance Stairs-Rails: Right Entrance Stairs-Number of Steps: 5 Alternate Level Stairs-Number of Steps: flight Home Layout: Two level Home Equipment: Agricultural consultant (2 wheels) (was husbands and likely too high as pt is 5'0") Additional Comments: Patient plans to sleep on the couch, half bath downstairs    Prior Function            PT Goals (current goals can now be found in the care plan section) Acute Rehab PT Goals Patient Stated Goal: regain IND PT Goal Formulation: With patient Time For Goal Achievement: 09/15/22 Potential to Achieve Goals: Good Progress towards PT goals: Progressing toward goals    Frequency    7X/week      PT Plan Current plan remains appropriate    Co-evaluation              AM-PAC PT "6 Clicks" Mobility   Outcome Measure  Help needed turning from your back to your side while in a flat bed without using bedrails?: A Little Help needed moving from lying on your back to sitting on the side of a flat bed without using bedrails?: A Little Help needed moving to and from a bed to a chair (including a wheelchair)?: A Little Help needed standing up from a chair using your arms (e.g., wheelchair or bedside chair)?: A Little Help needed to walk in hospital room?: A Little Help needed climbing 3-5 steps with a railing? : A Little 6 Click Score: 18    End of Session Equipment Utilized During Treatment: Gait belt Activity Tolerance: Patient tolerated treatment well Patient left: in chair;with call bell/phone within reach;with chair alarm set;with family/visitor present Nurse Communication: Mobility status PT Visit Diagnosis: Unsteadiness on feet (R26.81);Difficulty in walking, not elsewhere classified (R26.2)     Time: 3557-3220 PT Time Calculation (min) (ACUTE ONLY): 44 min  Charges:  $Gait Training: 23-37 mins $Therapeutic Activity: 8-22 mins                     Mauro Kaufmann PT Acute Rehabilitation  Services Pager [phone] Office [phone]    Brandolyn Shortridge 09/10/2022, 12:24 PM

## 2022-09-10 NOTE — Progress Notes (Signed)
D/c instructions given to pt and husband. All questions answered. Pt taken via wheelchair to front entrance for discharge.

## 2022-09-10 NOTE — Op Note (Signed)
Orthopedic Surgery Operative Report   Procedure: Right knee partial patellectomy   Modifier: none   Date of procedure: 09/08/2022   Patient name: Danielle Yoder MRN: [id number] DOB: 1946-01-09   Surgeon: Ileene Rubens, MD Assistant: None Pre-operative diagnosis: right displaced patella fracture with comminution Post-operative diagnosis: same as above Findings: multiple fracture fragments in the inferior pole, a large intact superior pole piece   Specimens: none Anesthesia: general EBL: [id number] Complications: none Pre-incision antibiotic: ancef   Implants: none    Indication for procedure: Patient is a 76  year old female who had a fall after tripping over an air mattress. She landed directly on her right knee. The patient was complaining of right knee pain and was found to have a displaced patella fracture with a disrupted extensor mechanism. I recommended open reduction internal fixation versus partial patellectomy to treat this fracture. Explained that if there is significant comminution seen intra-operatively then a partial patellectomy would be performed and that is the rational for it being presented as a possibility. The risks of this procedure included, but were not limited to: malunion, nonunion, tendon rupture off the superior pole, infection, persistent pain, extensor lag, bleeding, stiffness, need for additional procedures, blood clot, and death. The benefits of this procedure would be to stabilize the fracture in the proper alignment (if ORIF) and to restore the extensor mechanism. The alternatives of this surgery would be to treat the fracture with immobilization in a knee immobilizer or to do no intervention, but I explained that neither one of these would restore the integrity of her extensor mechanism. I answered all of the patient's questions to her satisfaction. Patient elected to proceed with surgery.    Procedure Description: The patient was met in the pre-operative  holding area. The patient's identity and consent were verified. The operative site was marked. The patient's remaining questions about surgery were answered. The patient was brought back to the operating room. General anesthesia was induced and an endotracheal tube was placed by the anesthesia staff. The patient was transferred to the operating table in the supine position on a jackson table. All bony prominences were well padded. The surgical area was cleansed with alcohol. Ancef was given prior to incision. The patient's skin was then prepped and draped in a standard, sterile fashion. A time out was performed that identified the patient, the procedure, and the laterality. All team members agreed with what was stated in the time out.    A midline incision was from two finger breadths superior to the proximal pole down to the tibial tubercle. It was carried down sharply through skin and dermis. Dissection was continued down to the level of the quadriceps and patellar tendon. Medial and lateral flaps were elevated at that level. The fracture was seen in the middle of the incision. A curette was used to debride the fracture fragments of hematoma and soft tissue. Two main fragments in the distal pole were seen superficially. As the fracture edges continued to be debrided with a curette and 15 blade, more fragments were found. Especially deeper, there were several small fragments. Decision was made to proceed with partial patellectomy since there was too much comminution to obtain stable fixation. A 15 blade knife was used to dissect directly on the bony fragments of the inferior pole and leave behind any soft tissue and patellar tendon attachment. A rongeur was used to remove these pieces of bone. In total, 8 separate pieces of bone were removed from the inferior pole.  The wound was then palpated and no remaining pieces were found. There superior pole was intact and no comminution was seen in it. Next, a #2 fiberwire  was run down the middle and lateral aspect of the patellar tendon with krackow stitches. A total of 4 throws were made going down the tendon and 4 running up the tendon. The same process was then repeated with another fiberwire on the middle and medial aspect of the tendon. A K wire was used to drill a tunnel through the superior pole of the patella on the lateral aspect of it. A hewson suture passer was placed through the tunnel and the lateral suture in the patellar tendon was passed through the tunnel. The same process was repeated for a middle tunnel through the superior pole of the patella only this time, the two middle sutures were passed through that tunnel. Finally, a medial tunnel was made in the same fashion and the medial suture was passed through it. A bump was placed under the ankle to keep the knee fully extended. The medial and middle sutures were then tied together. The patellar tendon was seen to have advanced to the bony edge of the remaining superior pole. The middle and lateral sutures were then tied together. Afterwards, the tendon was seen abutting the superior pole. There were no gaps between the tendon and bone. A final AP and lateral fluoroscopic image showed no remaining inferior pole fragments. The wound was copiously irrigated with sterile saline. There was only a small tear in the retinaculum medially. A 0 vicryl was used to close the retinaculum. 2-0 vicryl was used to close the deep dermal layer. 3-0 nylon was used to close the skin in a horizontal mattress fashion. All counts were correct at the end of the case. The patient was transferred back to a hospital bed and extubated. The patient was brought back to the post anesthesia care unit in stable condition.    Post-operative plan: The patient will recover in the post-anesthesia care unit and then go to the floor. The patient will receive two post-operative doses of ancef. The patient will be weight bearing as tolerated with knee  immobilizer at all times. Will get a bledsoe brace locked in extension for her as we will eventually use that to start range of motion later in the recovery process. The patient will work with physical therapy. Her disposition will be determined by the medicine service that she is admitted to.     Ileene Rubens, MD Orthopedic Surgeon

## 2022-09-10 NOTE — TOC Transition Note (Signed)
Transition of Care [facility]) - CM/SW Discharge Note  Patient Details  Name: Danielle Yoder MRN: [id number] Date of Birth: Mar 27, 1946  [facility] CM/SW Contact:  Ewing Schlein, LCSW Phone Number: 09/10/2022, 11:27 AM  Clinical Narrative: PT evaluation did not recommend PT follow up, but a youth rolling walker and 3N1 were recommended. Patient is agreeable to having both delivered to her room by [employer]. CSW made DME referral to [facility] with [employer]. DME delivered to patient's room. Patient will go home with a regular walker, but [employer] will switch it out for a youth rolling walker once the patient is discharged home. Patient and husband aware. TOC signing off.    Final next level of care: Home/Self Care Barriers to Discharge: Barriers Resolved  Patient Goals and CMS Choice CMS Medicare.gov Compare Post Acute Care list provided to:: Patient Choice offered to / list presented to : Patient  Discharge Plan and Services Additional resources added to the After Visit Summary for        DME Arranged: 3-N-1, Walker youth DME Agency: [employer] Date DME Agency Contacted: 09/10/22 Time DME Agency Contacted: [phone] Representative spoke with at DME Agency: Vaughan Basta  Social Determinants of Health (SDOH) Interventions SDOH Screenings   Food Insecurity: No Food Insecurity (09/07/2022)  Housing: Low Risk  (09/07/2022)  Transportation Needs: No Transportation Needs (09/07/2022)  Utilities: Not At Risk (09/07/2022)  Depression (PHQ2-9): Low Risk  (02/05/2022)  Financial Resource Strain: Low Risk  (02/05/2022)  Physical Activity: Sufficiently Active (02/05/2022)  Stress: No Stress Concern Present (02/05/2022)  Tobacco Use: Low Risk  (09/09/2022)   Readmission Risk Interventions     No data to display

## 2022-09-10 NOTE — Discharge Summary (Signed)
[facility]  Physician Discharge Summary   Patient ID: Danielle Yoder MRN: [id number] DOB/AGE: [birth year] 75 y.o.  Admit date: 09/07/2022 Discharge date:   09/10/2022   PCP: [name], MD  DISCHARGE DIAGNOSES:    Unspecified fracture of right patella, initial encounter for closed fracture Essential hypertension    RECOMMENDATIONS FOR OUTPATIENT FOLLOW UP: Follow-up with orthopedics as instructed    Home Health: None Equipment/Devices: None  CODE STATUS: Full code  DISCHARGE CONDITION: fair  Diet recommendation: Heart healthy as before  INITIAL HISTORY: 76 y.o. female with medical history significant of essential hypertension, hyperlipidemia, osteoarthritis presented to the hospital with complaints of right knee pain.  Patient had a mechanical fall on the day of admission.  She was found to have a right patella fracture.  She was hospitalized for further management.  Orthopedics was consulted.    Consultants: Orthopedics, Dr. Christell Constant   Procedures: Right partial patellectomy 12/27   HOSPITAL COURSE:   Right patella fracture Patient was seen by orthopedics.  Underwent right partial patellectomy me on 12/27. Seen by PT and OT.  Cleared for discharge today.   Essential hypertension Continue home med occasions.     Constipation Bowel regimen initiated.   Hyperlipidemia Continue statin.  Patient is stable for discharge home today.   PERTINENT LABS:  The results of significant diagnostics from this hospitalization (including imaging, microbiology, ancillary and laboratory) are listed below for reference.    Microbiology: Recent Results (from the past 240 hour(s))  Surgical PCR screen     Status: None   Collection Time: 09/08/22 11:44 AM   Specimen: Nasal Mucosa; Nasal Swab  Result Value Ref Range Status   MRSA, PCR NEGATIVE NEGATIVE Final   Staphylococcus aureus NEGATIVE NEGATIVE Final    Comment: (NOTE) The Xpert SA Assay (FDA approved  for NASAL specimens in patients 50 years of age and older), is one component of a comprehensive surveillance program. It is not intended to diagnose infection nor to guide or monitor treatment. Performed at [facility], [address] [place], [place] [address]      Labs:   Basic Metabolic Panel: Recent Labs  Lab 09/07/22 1711 09/08/22 0840 09/09/22 0422 09/10/22 0736  NA 139 138 139 137  K 3.8 4.0 3.8 4.4  CL 106 109 108 106  CO2 24 21* 22 26  GLUCOSE 90 96 151* 105*  BUN 19 14 12 23   CREATININE 0.76 0.72 0.80 0.90  CALCIUM 10.2 9.4 9.4 9.3  MG  --  1.7 1.5* 2.3    CBC: Recent Labs  Lab 09/07/22 1711 09/08/22 0840 09/09/22 0422 09/10/22 0736  WBC 6.9 7.9 8.9 12.1*  NEUTROABS 3.8  --   --   --   HGB 13.7 11.9* 11.4* 10.7*  HCT 43.4 37.9 36.2 34.5*  MCV 86.3 86.5 86.6 88.0  PLT 234 208 195 185   Cardiac Enzymes: Recent Labs  Lab 09/07/22 1711  CKTOTAL 462*     IMAGING STUDIES DG Knee 1-2 Views Right  Result Date: 09/08/2022 CLINICAL DATA:  Right patellar fracture repair EXAM: RIGHT KNEE - 1-2 VIEW COMPARISON:  09/07/2022 FINDINGS: 2 fluoroscopic images are obtained during the performance of the procedure and are provided for interpretation only. The lateral image demonstrates apparent resection of the lower pole patellar fracture fragment seen previously. Alignment is grossly anatomic. Please refer to operative report. FLUOROSCOPY TIME:  5 seconds, 2.27 mGy IMPRESSION: 1. Intraoperative evaluation as above. Please refer to operative report. Electronically  Signed   By: Sharlet Salina M.D.   On: 09/08/2022 20:31   DG C-Arm 1-60 Min-No Report  Result Date: 09/08/2022 Fluoroscopy was utilized by the requesting physician.  No radiographic interpretation.   DG C-Arm 1-60 Min-No Report  Result Date: 09/08/2022 Fluoroscopy was utilized by the requesting physician.  No radiographic interpretation.   DG Knee Complete 4 Views  Right  Result Date: 09/07/2022 CLINICAL DATA:  Status post fall, right knee pain EXAM: RIGHT KNEE - COMPLETE 4+ VIEW COMPARISON:  03/19/2020 FINDINGS: Comminuted fracture of the patella with 13 mm of distraction and the fracture involving the articular surface. Small joint effusion. No other acute fracture or dislocation. No aggressive osseous lesion. Normal alignment. Soft tissue are unremarkable. No radiopaque foreign body or soft tissue emphysema. IMPRESSION: 1. Comminuted fracture of the patella with 13 mm of distraction and the fracture involving the articular surface. Electronically Signed   By: Elige Ko M.D.   On: 09/07/2022 12:56    DISCHARGE EXAMINATION: Vitals:   09/09/22 0613 09/09/22 1410 09/09/22 2222 09/10/22 0638  BP: 136/71 (!) 134/56 (!) 151/68 125/60  Pulse: 73 67 66 63  Resp: 17 16 17 16   Temp: (!) 97.5 F (36.4 C) 97.6 F (36.4 C) 98 F (36.7 C) 97.6 F (36.4 C)  TempSrc: Oral  Oral Oral  SpO2: 97% 97% 97% 97%  Weight:      Height:       General appearance: Awake alert.  In no distress Resp: Clear to auscultation bilaterally.  Normal effort Cardio: S1-S2 is normal regular.  No S3-S4.  No rubs murmurs or bruit GI: Abdomen is soft.  Nontender nondistended.  Bowel sounds are present normal.   DISPOSITION: Home  Discharge Instructions     Call MD for:  difficulty breathing, headache or visual disturbances   Complete by: As directed    Call MD for:  extreme fatigue   Complete by: As directed    Call MD for:  persistant dizziness or light-headedness   Complete by: As directed    Call MD for:  persistant nausea and vomiting   Complete by: As directed    Call MD for:  redness, tenderness, or signs of infection (pain, swelling, redness, odor or green/yellow discharge around incision site)   Complete by: As directed    Call MD for:  severe uncontrolled pain   Complete by: As directed    Call MD for:  temperature >100.4   Complete by: As directed    Diet - low  sodium heart healthy   Complete by: As directed    Discharge instructions   Complete by: As directed    Please take your medications as prescribed.  Please be sure to follow-up with Dr. .  Take your laxatives.  Seek attention if you do not have a bowel movement in 2 to 3 days.  You were cared for by a hospitalist during your hospital stay. If you have any questions about your discharge medications or the care you received while you were in the hospital after you are discharged, you can call the unit and asked to speak with the hospitalist on call if the hospitalist that took care of you is not available. Once you are discharged, your primary care physician will handle any further medical issues. Please note that NO REFILLS for any discharge medications will be authorized once you are discharged, as it is imperative that you return to your primary care physician (or establish a relationship with  a primary care physician if you do not have one) for your aftercare needs so that they can reassess your need for medications and monitor your lab values. If you do not have a primary care physician, you can call [phone] for a physician referral.   Increase activity slowly   Complete by: As directed          Allergies as of 09/10/2022   No Known Allergies      Medication List     TAKE these medications    acetaminophen 500 MG tablet Commonly known as: TYLENOL Take 2 tablets (1,000 mg total) by mouth every 8 (eight) hours for 21 days. What changed:  medication strength how much to take when to take this reasons to take this   aspirin EC 81 MG tablet Take 1 tablet (81 mg total) by mouth in the morning and at bedtime. Swallow whole.   atorvastatin 40 MG tablet Commonly known as: LIPITOR Take 1 tablet (40 mg total) by mouth daily. What changed: when to take this   CALCIUM + D3 PO Take 1 tablet by mouth daily.   hydrochlorothiazide 12.5 MG capsule Commonly known as:  MICROZIDE Take 1 capsule (12.5 mg total) by mouth daily.   oxyCODONE 5 MG immediate release tablet Commonly known as: Oxy IR/ROXICODONE Take 1-2 tablets (5-10 mg total) by mouth every 4 (four) hours as needed for up to 7 days for moderate pain.   polyethylene glycol 17 g packet Commonly known as: MIRALAX / GLYCOLAX Take 17 g by mouth 2 (two) times daily.   senna 8.6 MG Tabs tablet Commonly known as: SENOKOT Take 1 tablet (8.6 mg total) by mouth 2 (two) times daily for 21 days.   verapamil 240 MG CR tablet Commonly known as: CALAN-SR Take 1 tablet (240 mg total) by mouth daily.               Durable Medical Equipment  (From admission, onward)           Start     Ordered   09/10/22 1134  For home use only DME Walker youth  Once       Question:  Patient needs a walker to treat with the following condition  Answer:  Physical deconditioning   09/10/22 1133   09/10/22 1134  For home use only DME 3 n 1  Once        09/10/22 1133   09/10/22 0829  For home use only DME Walker rolling  Once       Comments: Youth walker  Question Answer Comment  Walker: With 5 Inch Wheels   Patient needs a walker to treat with the following condition Patella fracture      09/10/22 0830              Follow-up Information     London Sheer, MD Follow up.   Specialty: Orthopedic Surgery Why: post hospitalization follow up Contact information: [address] [place] [address] [phone]                 TOTAL DISCHARGE TIME: 35 minutes  Gerilyn Stargell Rito Ehrlich  [facility] Pager on [email or web address]  09/10/2022, 12:29 PM

## 2022-09-10 NOTE — Evaluation (Signed)
Occupational Therapy Evaluation Patient Details Name: Danielle Yoder MRN: [id number] DOB: 1945/10/22 Today's Date: 09/10/2022   History of Present Illness Pt s/p fall with R patellar fx and now s/p ORIF.   Clinical Impression   Mrs. Elnita Surprenant is a 76 year old woman who presents s/p above surgery with knee brace not allowing flexion. ON evaluation she requires assistance for LB ADLs. Patient assisted with getting dressed and therapist educated patient and spouse on how to perform ADLs and compensatory strategies. Patient is grossly independent with toileting though has a low toilet at home. She is unable to shower at this time so sink baths are needed. Patient and spouse verbalize understanding of compensatory strategies and all questions answered. No further OT needs.      Recommendations for follow up therapy are one component of a multi-disciplinary discharge planning process, led by the attending physician.  Recommendations may be updated based on patient status, additional functional criteria and insurance authorization.   Follow Up Recommendations  No OT follow up     Assistance Recommended at Discharge Intermittent Supervision/Assistance  Patient can return home with the following A little help with bathing/dressing/bathroom;Assistance with cooking/housework;Help with stairs or ramp for entrance    Functional Status Assessment  Patient has had a recent decline in their functional status and demonstrates the ability to make significant improvements in function in a reasonable and predictable amount of time.  Equipment Recommendations  BSC/3in1    Recommendations for Other Services       Precautions / Restrictions Precautions Precautions: Fall Precaution Comments: No ROM at R knee Knee Immobilizer - Right: On at all times Restrictions Weight Bearing Restrictions: No RLE Weight Bearing: Weight bearing as tolerated      Mobility Bed Mobility                     Transfers                          Balance Overall balance assessment: Mild deficits observed, not formally tested                                         ADL either performed or assessed with clinical judgement   ADL Overall ADL's : Needs assistance/impaired Eating/Feeding: Independent   Grooming: Supervision/safety;Standing;Wash/dry face;Wash/dry hands;Oral care   Upper Body Bathing: Set up;Sitting   Lower Body Bathing: Moderate assistance;Sit to/from stand Lower Body Bathing Details (indicate cue type and reason): for lower legs Upper Body Dressing : Set up;Sitting   Lower Body Dressing: Sit to/from stand;Moderate assistance Lower Body Dressing Details (indicate cue type and reason): to dress RLE and left foot Toilet Transfer: Supervision/safety;Ambulation;Rolling walker (2 wheels);BSC/3in1   Toileting- Clothing Manipulation and Hygiene: Supervision/safety;Sit to/from stand       Functional mobility during ADLs: Supervision/safety       Vision Patient Visual Report: No change from baseline       Perception     Praxis      Pertinent Vitals/Pain Pain Assessment Pain Assessment: No/denies pain     Hand Dominance     Extremity/Trunk Assessment Upper Extremity Assessment Upper Extremity Assessment: Overall WFL for tasks assessed   Lower Extremity Assessment Lower Extremity Assessment: Defer to PT evaluation   Cervical / Trunk Assessment Cervical / Trunk Assessment: Normal   Communication Communication Communication: No difficulties  Cognition Arousal/Alertness: Awake/alert Behavior During Therapy: WFL for tasks assessed/performed Overall Cognitive Status: Within Functional Limits for tasks assessed                                       General Comments       Exercises     Shoulder Instructions      Home Living Family/patient expects to be discharged to:: Private residence Living Arrangements:  Spouse/significant other Available Help at Discharge: Family;Available 24 hours/day Type of Home: House Home Access: Stairs to enter [employer] of Steps: 5 Entrance Stairs-Rails: Right Home Layout: Two level Alternate Level Stairs-Number of Steps: flight Alternate Level Stairs-Rails: Right           Home Equipment: Agricultural consultant (2 wheels) (was husbands and likely too high as pt is 5'0")   Additional Comments: Patient plans to sleep on the couch, half bath downstairs      Prior Functioning/Environment Prior Level of Function : Independent/Modified Independent                        OT Problem List: Decreased range of motion;Impaired balance (sitting and/or standing);Pain;Decreased knowledge of use of DME or AE;Decreased knowledge of precautions      OT Treatment/Interventions:      OT Goals(Current goals can be found in the care plan section) Acute Rehab OT Goals OT Goal Formulation: All assessment and education complete, DC therapy  OT Frequency:      Co-evaluation              AM-PAC OT "6 Clicks" Daily Activity     Outcome Measure Help from another person eating meals?: None Help from another person taking care of personal grooming?: A Little Help from another person toileting, which includes using toliet, bedpan, or urinal?: A Little Help from another person bathing (including washing, rinsing, drying)?: A Little Help from another person to put on and taking off regular upper body clothing?: A Little Help from another person to put on and taking off regular lower body clothing?: A Lot 6 Click Score: 18   End of Session Equipment Utilized During Treatment: Rolling walker (2 wheels) Nurse Communication: Mobility status  Activity Tolerance: Patient tolerated treatment well Patient left: in chair;with call bell/phone within reach;with family/visitor present;with nursing/sitter in room  OT Visit Diagnosis: Pain;Other abnormalities of gait  and mobility (R26.89)                Time: 5361-4431 OT Time Calculation (min): 16 min Charges:  OT General Charges $OT Visit: 1 Visit OT Evaluation $OT Eval Low Complexity: 1 Low  Donnella Sham, OTR/L Acute Care Rehab Services  Office [phone]   Kelli Churn 09/10/2022, 10:53 AM

## 2022-09-10 NOTE — Progress Notes (Signed)
Orthopedic Surgery Progress Note   Assessment: Patient is a 76 y.o. female with right patella fracture status post partial patellectomy   Plan: -Operative plans: complete -Diet: regular -DVT ppx: per primary, prescribed aspirin 81mg  BID at discharge  -Antibiotics: ancef x2 post-op doses -Weight bearing status: as tolerated in bledsoe brace locked in extension -PT/OT evaluate and treat -Pain control -Dispo: per primary  ___________________________________________________________________________  Subjective: No acute events overnight. Pain adequately controlled. Felt therapy went well yesterday. Denies paresthesias and numbness.    Physical Exam:  General: no acute distress, appears stated age Neurologic: alert, answering questions appropriately, following commands Respiratory: unlabored breathing on room air, symmetric chest rise Psychiatric: appropriate affect, normal cadence to speech  MSK:   -Right lower extremity  Dressings over knee c/d/i, ice over knee, bledsoe in place EHL/TA/GSC intact Plantarflexes and dorsiflexes toes Sensation intact to light touch in sural, saphenous, tibial, deep peroneal, and superficial peroneal nerve distributions Foot warm and well perfused   Patient name: Danielle Yoder Patient MRN: Cloyd Stagers Date: 09/10/22

## 2022-09-14 ENCOUNTER — Telehealth: Payer: Self-pay

## 2022-09-14 ENCOUNTER — Telehealth: Payer: Self-pay | Admitting: *Deleted

## 2022-09-14 NOTE — Progress Notes (Signed)
  Care Coordination  Note  09/14/2022 Name: Maritsa Hunsucker MRN: [id number] DOB: 11-09-1945  Emillee Talsma Vint is a 77 y.o. year old primary care patient of [employer], Elyse Jarvis, MD. I reached out to [employer] by phone today to assist with scheduling a follow up appointment. Arlester Marker Deady verbally consented to my assistance.       Follow up plan: Unsuccessful telephone outreach attempt made. A HIPAA compliant phone message was left for the patient providing contact information and requesting a return call.   [place]  Direct Dial: [phone]

## 2022-09-14 NOTE — Patient Outreach (Signed)
  Care Coordination TOC Note Transition Care Management Follow-up Telephone Call Date of discharge and from where: 09/10/22-[facility]   Dx: "unspecified fracture of right patella" How have you been since you were released from the hospital? Patient states she is "doing okay."Pain is managed/controlled with her only having to take Oxycodone 1tab q8hrs and Tylenol prn between doses. She is having regular BMs. Appetite is good. She has been up ambulating with walker round the house for short distances.  Any questions or concerns? No  Items Reviewed: Did the pt receive and understand the discharge instructions provided? Yes  Medications obtained and verified? Yes  Other? Yes -post op care Any new allergies since your discharge? No  Dietary orders reviewed? Yes Do you have support at home? Yes -spouse  Home Care and Equipment/Supplies: Were home health services ordered? not applicable If so, what is the name of the agency? N/A  Has the agency set up a time to come to the patient's home? not applicable Were any new equipment or medical supplies ordered?  Yes: walker, 3N1 What is the name of the medical supply agency? [employer] Were you able to get the supplies/equipment? yes Do you have any questions related to the use of the equipment or supplies? No  Functional Questionnaire: (I = Independent and D = Dependent) ADLs: A  Bathing/Dressing- A  Meal Prep- A  Eating- I  Maintaining continence- A  Transferring/Ambulation- A  Managing Meds- I  Follow up appointments reviewed:  PCP Hospital f/u appt confirmed?  Patient has not seen PCP since June 2023-feels like she needs to follow up with him as well. Patient agreeable to care guide calling to assist with scheduling. Marland [facility] f/u appt confirmed?  Patient will call ortho MD today to make an appt  . Are transportation arrangements needed? No  If their condition worsens, is the pt aware to call PCP or go to the  Emergency Dept.? Yes Was the patient provided with contact information for the PCP's office or ED? Yes Was to pt encouraged to call back with questions or concerns? Yes  SDOH assessments and interventions completed:   Yes SDOH Interventions Today    Flowsheet Row Most Recent Value  SDOH Interventions   Food Insecurity Interventions Intervention Not Indicated  Transportation Interventions Intervention Not Indicated       Care Coordination Interventions:  PCP follow up appointment requested Education provided on post op care    Encounter Outcome:  Pt. Visit Completed    Enzo Montgomery, RN,BSN,CCM [place] [facility] Telephonic Care Management Coordinator Direct Phone: [phone] Toll Free: [phone] Fax: [phone]

## 2022-09-14 NOTE — Progress Notes (Signed)
  Care Coordination  Note  09/14/2022 Name: Danielle Yoder MRN: [id number] DOB: 10/30/1945  [name] is a 77 y.o. year old primary care patient of [employer], Elyse Jarvis, MD. I reached out to [employer] by phone today to assist with scheduling a follow up appointment. [name] verbally consented to my assistance.       Follow up plan: Hospital Follow Up appointment scheduled with [employer], Elyse Jarvis, MD) on (09/16/22) at (330 PM).  [place]  Direct Dial: [phone]

## 2022-09-14 NOTE — Patient Outreach (Signed)
  Care Coordination [facility] Note Transition Care Management Follow-up Telephone Call    Incoming call from patient stating she called surgeon's office to make follow up ppt. She was advised that surgeon only has appt for tomorrow and Jan 31st. Patient concerned as appt not available for exactly 2wks out and wanting to know which appt to take. Encouraged patient to take appt for tomorrow so she does not have to wait until a month to be seen by surgeon. If surgeon feels she needs to be seen again during appt then another appt can be made during that time. She voiced understanding.  Care Coordination Interventions:  Education provided    Encounter Outcome:  Pt. Visit Completed     Enzo Montgomery, RN,BSN,CCM [place] [facility] Telephonic Care Management Coordinator Direct Phone: [phone] Toll Free: [phone] Fax: [phone]

## 2022-09-15 ENCOUNTER — Encounter: Payer: Medicare PPO | Admitting: Orthopedic Surgery

## 2022-09-16 ENCOUNTER — Ambulatory Visit: Payer: Medicare PPO | Admitting: Family Medicine

## 2022-09-16 ENCOUNTER — Encounter: Payer: Self-pay | Admitting: Family Medicine

## 2022-09-16 VITALS — BP 140/58 | HR 64 | Temp 97.8°F

## 2022-09-16 DIAGNOSIS — S82001A Unspecified fracture of right patella, initial encounter for closed fracture: Secondary | ICD-10-CM

## 2022-09-16 NOTE — Progress Notes (Signed)
   Subjective:    Patient ID: Danielle Yoder, female    DOB: 01/02/1946, 77 y.o.   MRN: [id number]  HPI She is here for A consult for recent hospitalization and treatment of a right patellar fracture.  She had a partial patellectomy due to the fragments that they were dealing with.  They did give her Lovenox for couple of days.  She is in a brace and keeping her foot elevated.  She can use a walker if she needs to go to the bathroom.  She is also using oxycodone for the pain and was told to use Senokot as well as potentially MiraLAX to help with her constipation.  She is taking a baby aspirin every day.   Review of Systems     Objective:   Physical Exam Alert and in no distress otherwise not examined The hospital record including ER and operative note as well as discharge summary was reviewed      Assessment & Plan:  Unspecified fracture of right patella, initial encounter for closed fracture I discussed the need to continue on the pain medicine as needed continue with baby aspirin.  Keep both feet elevated.  Use the walker on an as-needed basis.  Discussed potential follow-up down the road and the fact that when she gets involved in physical therapy they will work through to her to build her strength as well as work on proper gait until she gets back to her normal self.  She will call if she has any further troubles.

## 2022-09-21 ENCOUNTER — Telehealth: Payer: Self-pay | Admitting: Orthopedic Surgery

## 2022-09-21 MED ORDER — OXYCODONE HCL 5 MG PO TABS: 5.0000 mg | ORAL_TABLET | ORAL | 0 refills | Status: AC | PRN

## 2022-09-21 NOTE — Telephone Encounter (Signed)
Pt called requesting a refill of pain medication. She has an appt tomorrow but due to weather she has been taking more due to the cold weather. Please send to pharmacy on file. Pt phone number is [phone].

## 2022-09-22 ENCOUNTER — Ambulatory Visit (INDEPENDENT_AMBULATORY_CARE_PROVIDER_SITE_OTHER): Payer: Medicare PPO | Admitting: Orthopedic Surgery

## 2022-09-22 ENCOUNTER — Ambulatory Visit (INDEPENDENT_AMBULATORY_CARE_PROVIDER_SITE_OTHER): Payer: Medicare PPO

## 2022-09-22 DIAGNOSIS — S82041A Displaced comminuted fracture of right patella, initial encounter for closed fracture: Secondary | ICD-10-CM | POA: Diagnosis not present

## 2022-09-22 NOTE — Progress Notes (Signed)
Orthopedic Surgery Progress Note   Assessment: Patient is a 77 y.o. female with right patella fracture status post partial patellectomy    Plan: -Operative plans complete -Sutures removed in office today -Pain control: Oxycodone 5 mg every 4 hours (prescribed 1/09) -Aspirin 81 mg twice daily for DVT prophylaxis -Weight-bear as tolerated with Bledsoe brace locked in extension -Okay to let soap and water run over the incision but do not submerge -Return to office in 4 weeks with right knee AP and lateral x-ray  ___________________________________________________________________________  Subjective: Has been doing well since surgery.  Is recovering at home.  Has been in the Bledsoe brace with it locked in extension since surgery.  Has been ambulating around the house with assistance of the walker.  Has not noticed any redness or drainage around her incision.  Is decreasing her amount of pain medication usage.  Denies paresthesias and numbness.   Physical Exam:  General: no acute distress, appears stated age Neurologic: alert, answering questions appropriately, following commands Respiratory: unlabored breathing on room air, symmetric chest rise Psychiatric: appropriate affect, normal cadence to speech  MSK:   -Right lower extremity  Incisions appear well healed with no evidence of infection, suture in place, no active or expressible drainage EHL/TA/GSC intact Plantarflexes and dorsiflexes toes Sensation intact to light touch in sural, saphenous, tibial, deep peroneal, and superficial peroneal nerve distributions Foot warm and well perfused  X-rays of the right knee taken and reviewed today show nondisplaced intra-articular fracture that appears unchanged from preoperative x-rays.  There are also several small pieces of comminution within the patellar tendon distal to the remaining patella.  Patella reduced.    Patient name: Danielle Yoder Patient MRN: [id number] Date:  09/22/22

## 2022-09-30 ENCOUNTER — Telehealth: Payer: Self-pay

## 2022-09-30 DIAGNOSIS — S82032G Displaced transverse fracture of left patella, subsequent encounter for closed fracture with delayed healing: Secondary | ICD-10-CM

## 2022-09-30 NOTE — Telephone Encounter (Signed)
She states that she is afraid to take a shower right now, she would like to see if [facility] would be able to send someone out to help her.

## 2022-09-30 NOTE — Telephone Encounter (Signed)
Patient called triage phone and left message. She had Right knee ORIF patella surgery on 09/08/2022. She wants Dr.Moore's assistant to call her back regarding showering and brace. Call back [phone]. Thanks

## 2022-09-30 NOTE — Addendum Note (Signed)
Addended by: Ileene Rubens on: 09/30/2022 04:41 PM   Modules accepted: Orders

## 2022-10-07 NOTE — Addendum Note (Signed)
Addended by: Ileene Rubens on: 10/07/2022 08:24 AM   Modules accepted: Orders

## 2022-10-08 ENCOUNTER — Telehealth: Payer: Self-pay | Admitting: Orthopedic Surgery

## 2022-10-08 NOTE — Telephone Encounter (Signed)
Patient called advised the brace she is wearing is rubbing against her right leg. Patient said she is not sure if the brace is rubbing against her incision. Patient asked if she can come in to make sure the brace have not shifted or anything.  The number to contact patient is [phone]

## 2022-10-11 NOTE — Telephone Encounter (Signed)
Coming in 10/12/22 @ 945 for Dr. Laurance Flatten to look at her brace

## 2022-10-12 ENCOUNTER — Ambulatory Visit (INDEPENDENT_AMBULATORY_CARE_PROVIDER_SITE_OTHER): Payer: Medicare PPO | Admitting: Orthopedic Surgery

## 2022-10-12 DIAGNOSIS — S82041D Displaced comminuted fracture of right patella, subsequent encounter for closed fracture with routine healing: Secondary | ICD-10-CM

## 2022-10-12 NOTE — Progress Notes (Signed)
Orthopedic Surgery Progress Note     Assessment: Patient is a 77 y.o. female with right patella fracture status post partial patellectomy  Surgery date: 09/08/2022     Plan: -Operative plans complete -Pain control: tylenol -Aspirin 81 mg twice daily for DVT prophylaxis (total of 6 weeks) -Weight-bear as tolerated with Bledsoe brace locked in extension -Brace adjusted and retightened in the office -Okay to let soap and water run over the incision but do not submerge -Keep current regularly scheduled follow up, x-rays at next visit: right knee AP and lateral   ___________________________________________________________________________   Subjective: Pain in her right knee is significantly improved since last time I saw her.  She is no longer taking narcotic pain medications.  She stopped taking those about 1 week ago.  She is just using occasional Tylenol for pain relief.  She has not noticed any redness or drainage around the incision.  She comes in today earlier than her regular scheduled follow-up because she feels that the brace has migrated caudally and is rubbing on the incision.  She is not sure how to adjust the brace.  Denies paresthesias and numbness in the right lower extremity.  Has been ambulating with a walker with her knee extended.     Physical Exam:   General: no acute distress, appears stated age Neurologic: alert, answering questions appropriately, following commands Respiratory: unlabored breathing on room air, symmetric chest rise Psychiatric: appropriate affect, normal cadence to speech   MSK:    -Right lower extremity             Incisions appear well healed with no evidence of infection, no active or expressible drainage EHL/TA/GSC intact Plantarflexes and dorsiflexes toes Sensation intact to light touch in sural, saphenous, tibial, deep peroneal, and superficial peroneal nerve distributions Foot warm and well perfused   No new x-rays at today's visit      Patient name: Danielle Yoder Patient MRN: [id number] Date: 10/12/22

## 2022-10-15 ENCOUNTER — Telehealth: Payer: Self-pay | Admitting: Orthopedic Surgery

## 2022-10-15 DIAGNOSIS — Z9181 History of falling: Secondary | ICD-10-CM | POA: Diagnosis not present

## 2022-10-15 DIAGNOSIS — I10 Essential (primary) hypertension: Secondary | ICD-10-CM | POA: Diagnosis not present

## 2022-10-15 DIAGNOSIS — E785 Hyperlipidemia, unspecified: Secondary | ICD-10-CM | POA: Diagnosis not present

## 2022-10-15 DIAGNOSIS — S82041D Displaced comminuted fracture of right patella, subsequent encounter for closed fracture with routine healing: Secondary | ICD-10-CM | POA: Diagnosis not present

## 2022-10-15 NOTE — Telephone Encounter (Signed)
Danielle Butts, RN w/ [facility] called as an FYI. They had planned to see pt on 10/13/22, but pt postponed for today on 10/15/22.

## 2022-10-20 DIAGNOSIS — Z9181 History of falling: Secondary | ICD-10-CM | POA: Diagnosis not present

## 2022-10-20 DIAGNOSIS — I10 Essential (primary) hypertension: Secondary | ICD-10-CM | POA: Diagnosis not present

## 2022-10-20 DIAGNOSIS — E785 Hyperlipidemia, unspecified: Secondary | ICD-10-CM | POA: Diagnosis not present

## 2022-10-20 DIAGNOSIS — S82041D Displaced comminuted fracture of right patella, subsequent encounter for closed fracture with routine healing: Secondary | ICD-10-CM | POA: Diagnosis not present

## 2022-10-21 DIAGNOSIS — Z9181 History of falling: Secondary | ICD-10-CM | POA: Diagnosis not present

## 2022-10-21 DIAGNOSIS — S82041D Displaced comminuted fracture of right patella, subsequent encounter for closed fracture with routine healing: Secondary | ICD-10-CM | POA: Diagnosis not present

## 2022-10-21 DIAGNOSIS — I10 Essential (primary) hypertension: Secondary | ICD-10-CM | POA: Diagnosis not present

## 2022-10-21 DIAGNOSIS — E785 Hyperlipidemia, unspecified: Secondary | ICD-10-CM | POA: Diagnosis not present

## 2022-10-25 ENCOUNTER — Ambulatory Visit (INDEPENDENT_AMBULATORY_CARE_PROVIDER_SITE_OTHER): Payer: Medicare PPO | Admitting: Orthopedic Surgery

## 2022-10-25 ENCOUNTER — Ambulatory Visit (INDEPENDENT_AMBULATORY_CARE_PROVIDER_SITE_OTHER): Payer: Medicare PPO

## 2022-10-25 ENCOUNTER — Encounter: Payer: Self-pay | Admitting: Orthopedic Surgery

## 2022-10-25 VITALS — BP 176/78 | HR 71 | Ht 60.0 in | Wt 136.0 lb

## 2022-10-25 DIAGNOSIS — S82041D Displaced comminuted fracture of right patella, subsequent encounter for closed fracture with routine healing: Secondary | ICD-10-CM

## 2022-10-25 NOTE — Progress Notes (Signed)
Orthopedic Surgery Progress Note     Assessment: Patient is a 77 y.o. female with right patella fracture status post partial patellectomy  Surgery date: 09/08/2022 (~6 weeks post-op)     Plan: -Operative plans complete -Pain control: OTC medications -Weight-bear as tolerated with Bledsoe brace (from 0-30 degrees) -Okay to let soap and water run over the incision and submerge at this point -Return to clinic in 2 weeks, will increase ROM at that visit, XRs at next visit: none   ___________________________________________________________________________   Subjective: Not having much pain in her knee at this point. Not taking any medication for pain. Has been getting home health PT to work on mobilizing with a walker. Has been able to walk with a walker and go up/down stairs at her house. No drainage or redness from the incision.      Physical Exam:   General: no acute distress, appears stated age Neurologic: alert, answering questions appropriately, following commands Respiratory: unlabored breathing on room air, symmetric chest rise Psychiatric: appropriate affect, normal cadence to speech   MSK:    -Right lower extremity             Incisions appear well healed with no evidence of infection EHL/TA/GSC intact Plantarflexes and dorsiflexes toes Sensation intact to light touch in sural, saphenous, tibial, deep peroneal, and superficial peroneal nerve distributions Foot warm and well perfused  XRs of the right knee taken and reviewed today show nondisplaced intaarticular patella fracture. There are small comminuted fragments distal to the remaining patella. Inferior pole has been removed. No new fractures seen.      Patient name: Danielle Yoder Patient MRN: [id number] Date: 10/25/22

## 2022-10-26 ENCOUNTER — Other Ambulatory Visit: Payer: Self-pay | Admitting: Family Medicine

## 2022-10-26 DIAGNOSIS — E785 Hyperlipidemia, unspecified: Secondary | ICD-10-CM | POA: Diagnosis not present

## 2022-10-26 DIAGNOSIS — Z1231 Encounter for screening mammogram for malignant neoplasm of breast: Secondary | ICD-10-CM

## 2022-10-26 DIAGNOSIS — Z9181 History of falling: Secondary | ICD-10-CM | POA: Diagnosis not present

## 2022-10-26 DIAGNOSIS — S82041D Displaced comminuted fracture of right patella, subsequent encounter for closed fracture with routine healing: Secondary | ICD-10-CM | POA: Diagnosis not present

## 2022-10-26 DIAGNOSIS — I10 Essential (primary) hypertension: Secondary | ICD-10-CM | POA: Diagnosis not present

## 2022-10-27 DIAGNOSIS — S82041D Displaced comminuted fracture of right patella, subsequent encounter for closed fracture with routine healing: Secondary | ICD-10-CM | POA: Diagnosis not present

## 2022-10-27 DIAGNOSIS — I10 Essential (primary) hypertension: Secondary | ICD-10-CM | POA: Diagnosis not present

## 2022-10-27 DIAGNOSIS — E785 Hyperlipidemia, unspecified: Secondary | ICD-10-CM | POA: Diagnosis not present

## 2022-10-27 DIAGNOSIS — Z9181 History of falling: Secondary | ICD-10-CM | POA: Diagnosis not present

## 2022-11-02 DIAGNOSIS — I10 Essential (primary) hypertension: Secondary | ICD-10-CM | POA: Diagnosis not present

## 2022-11-02 DIAGNOSIS — E785 Hyperlipidemia, unspecified: Secondary | ICD-10-CM | POA: Diagnosis not present

## 2022-11-02 DIAGNOSIS — S82041D Displaced comminuted fracture of right patella, subsequent encounter for closed fracture with routine healing: Secondary | ICD-10-CM | POA: Diagnosis not present

## 2022-11-02 DIAGNOSIS — Z9181 History of falling: Secondary | ICD-10-CM | POA: Diagnosis not present

## 2022-11-03 DIAGNOSIS — Z9181 History of falling: Secondary | ICD-10-CM | POA: Diagnosis not present

## 2022-11-03 DIAGNOSIS — I10 Essential (primary) hypertension: Secondary | ICD-10-CM | POA: Diagnosis not present

## 2022-11-03 DIAGNOSIS — E785 Hyperlipidemia, unspecified: Secondary | ICD-10-CM | POA: Diagnosis not present

## 2022-11-03 DIAGNOSIS — S82041D Displaced comminuted fracture of right patella, subsequent encounter for closed fracture with routine healing: Secondary | ICD-10-CM | POA: Diagnosis not present

## 2022-11-08 ENCOUNTER — Ambulatory Visit (INDEPENDENT_AMBULATORY_CARE_PROVIDER_SITE_OTHER): Payer: Medicare PPO | Admitting: Orthopedic Surgery

## 2022-11-08 DIAGNOSIS — S82041D Displaced comminuted fracture of right patella, subsequent encounter for closed fracture with routine healing: Secondary | ICD-10-CM

## 2022-11-08 NOTE — Progress Notes (Signed)
Orthopedic Surgery Progress Note     Assessment: Patient is a 77 y.o. female with right patella fracture status post partial patellectomy  Surgery date: 09/08/2022 (~8 weeks post-op)     Plan: -Operative plans complete -Pain control: OTC medications -Weight-bear as tolerated with Bledsoe brace (from 0-50 degrees) -Okay to let soap and water run over the incision and submerge at this point -Return to clinic in 2 weeks, will increase ROM at that visit, XRs at next visit: none   ___________________________________________________________________________   Subjective: Not having any pain in her knee. Not taking any medication, including tylenol, for pain. Has not noticed any drainage from her incision. Is starting to mobilize more. Denies paresthesias and numbness.    Physical Exam:   General: no acute distress, appears stated age Neurologic: alert, answering questions appropriately, following commands Respiratory: unlabored breathing on room air, symmetric chest rise Psychiatric: appropriate affect, normal cadence to speech   MSK:    -Right lower extremity             Incisions appear well healed with no evidence of infection EHL/TA/GSC intact Plantarflexes and dorsiflexes toes Sensation intact to light touch in sural, saphenous, tibial, deep peroneal, and superficial peroneal nerve distributions Foot warm and well perfused   XRs of the right knee taken 10/25/2022 were previously reviewed, showing nondisplaced intaarticular patella fracture. There are small comminuted fragments distal to the remaining patella. Inferior pole has been removed. No new fractures seen.      Patient name: Danielle Yoder Patient MRN: [id number] Date: 11/08/22

## 2022-11-10 DIAGNOSIS — S82041D Displaced comminuted fracture of right patella, subsequent encounter for closed fracture with routine healing: Secondary | ICD-10-CM | POA: Diagnosis not present

## 2022-11-10 DIAGNOSIS — E785 Hyperlipidemia, unspecified: Secondary | ICD-10-CM | POA: Diagnosis not present

## 2022-11-10 DIAGNOSIS — I10 Essential (primary) hypertension: Secondary | ICD-10-CM | POA: Diagnosis not present

## 2022-11-10 DIAGNOSIS — Z9181 History of falling: Secondary | ICD-10-CM | POA: Diagnosis not present

## 2022-11-11 DIAGNOSIS — S82041D Displaced comminuted fracture of right patella, subsequent encounter for closed fracture with routine healing: Secondary | ICD-10-CM | POA: Diagnosis not present

## 2022-11-11 DIAGNOSIS — E785 Hyperlipidemia, unspecified: Secondary | ICD-10-CM | POA: Diagnosis not present

## 2022-11-11 DIAGNOSIS — I10 Essential (primary) hypertension: Secondary | ICD-10-CM | POA: Diagnosis not present

## 2022-11-11 DIAGNOSIS — Z9181 History of falling: Secondary | ICD-10-CM | POA: Diagnosis not present

## 2022-11-14 DIAGNOSIS — I10 Essential (primary) hypertension: Secondary | ICD-10-CM | POA: Diagnosis not present

## 2022-11-14 DIAGNOSIS — E785 Hyperlipidemia, unspecified: Secondary | ICD-10-CM | POA: Diagnosis not present

## 2022-11-14 DIAGNOSIS — Z9181 History of falling: Secondary | ICD-10-CM | POA: Diagnosis not present

## 2022-11-14 DIAGNOSIS — S82041D Displaced comminuted fracture of right patella, subsequent encounter for closed fracture with routine healing: Secondary | ICD-10-CM | POA: Diagnosis not present

## 2022-11-16 DIAGNOSIS — E785 Hyperlipidemia, unspecified: Secondary | ICD-10-CM | POA: Diagnosis not present

## 2022-11-16 DIAGNOSIS — Z9181 History of falling: Secondary | ICD-10-CM | POA: Diagnosis not present

## 2022-11-16 DIAGNOSIS — I10 Essential (primary) hypertension: Secondary | ICD-10-CM | POA: Diagnosis not present

## 2022-11-16 DIAGNOSIS — S82041D Displaced comminuted fracture of right patella, subsequent encounter for closed fracture with routine healing: Secondary | ICD-10-CM | POA: Diagnosis not present

## 2022-11-17 DIAGNOSIS — Z9181 History of falling: Secondary | ICD-10-CM | POA: Diagnosis not present

## 2022-11-17 DIAGNOSIS — E785 Hyperlipidemia, unspecified: Secondary | ICD-10-CM | POA: Diagnosis not present

## 2022-11-17 DIAGNOSIS — I10 Essential (primary) hypertension: Secondary | ICD-10-CM | POA: Diagnosis not present

## 2022-11-17 DIAGNOSIS — S82041D Displaced comminuted fracture of right patella, subsequent encounter for closed fracture with routine healing: Secondary | ICD-10-CM | POA: Diagnosis not present

## 2022-11-22 ENCOUNTER — Ambulatory Visit (INDEPENDENT_AMBULATORY_CARE_PROVIDER_SITE_OTHER): Payer: Medicare PPO | Admitting: Orthopedic Surgery

## 2022-11-22 DIAGNOSIS — S82041D Displaced comminuted fracture of right patella, subsequent encounter for closed fracture with routine healing: Secondary | ICD-10-CM

## 2022-11-22 NOTE — Progress Notes (Signed)
Orthopedic Surgery Progress Note     Assessment: Patient is a 77 y.o. female with right patella fracture status post partial patellectomy  Surgery date: 09/08/2022 (~10 weeks post-op)     Plan: -Operative plans complete -Pain control: OTC medications -Weight-bear as tolerated with Bledsoe brace (from 0-70 degrees), will increase to 90 degrees at next visit -Okay to let soap and water run over the incision and submerge at this point -Can transition to cane use at this time -Return to clinic in 2 weeks, will increase ROM at that visit, XRs at next visit: AP and lateral knee out of brace   ___________________________________________________________________________   Subjective: Has been doing well since she was last seen.  She is not having any knee pain.  She has been working with physical therapy.  She has been able to walk with a walker.  She is interested in switching to a cane.  She has been going up and down stairs slowly.  Denies paresthesias and numbness.   Physical Exam:   General: no acute distress, appears stated age Neurologic: alert, answering questions appropriately, following commands Respiratory: unlabored breathing on room air, symmetric chest rise Psychiatric: appropriate affect, normal cadence to speech   MSK:    -Right lower extremity             Incisions appear well healed with no evidence of infection  Able to extend knee against gravity EHL/TA/GSC intact Plantarflexes and dorsiflexes toes Sensation intact to light touch in sural, saphenous, tibial, deep peroneal, and superficial peroneal nerve distributions Foot warm and well perfused   No x-rays taken at today's visit    Patient name: Danielle Yoder Patient MRN: [id number] Date: 11/22/22

## 2022-11-23 DIAGNOSIS — I10 Essential (primary) hypertension: Secondary | ICD-10-CM | POA: Diagnosis not present

## 2022-11-23 DIAGNOSIS — Z9181 History of falling: Secondary | ICD-10-CM | POA: Diagnosis not present

## 2022-11-23 DIAGNOSIS — E785 Hyperlipidemia, unspecified: Secondary | ICD-10-CM | POA: Diagnosis not present

## 2022-11-23 DIAGNOSIS — S82041D Displaced comminuted fracture of right patella, subsequent encounter for closed fracture with routine healing: Secondary | ICD-10-CM | POA: Diagnosis not present

## 2022-11-24 DIAGNOSIS — Z9181 History of falling: Secondary | ICD-10-CM | POA: Diagnosis not present

## 2022-11-24 DIAGNOSIS — S82041D Displaced comminuted fracture of right patella, subsequent encounter for closed fracture with routine healing: Secondary | ICD-10-CM | POA: Diagnosis not present

## 2022-11-24 DIAGNOSIS — E785 Hyperlipidemia, unspecified: Secondary | ICD-10-CM | POA: Diagnosis not present

## 2022-11-24 DIAGNOSIS — I10 Essential (primary) hypertension: Secondary | ICD-10-CM | POA: Diagnosis not present

## 2022-11-30 DIAGNOSIS — I10 Essential (primary) hypertension: Secondary | ICD-10-CM | POA: Diagnosis not present

## 2022-11-30 DIAGNOSIS — E785 Hyperlipidemia, unspecified: Secondary | ICD-10-CM | POA: Diagnosis not present

## 2022-11-30 DIAGNOSIS — S82041D Displaced comminuted fracture of right patella, subsequent encounter for closed fracture with routine healing: Secondary | ICD-10-CM | POA: Diagnosis not present

## 2022-11-30 DIAGNOSIS — Z9181 History of falling: Secondary | ICD-10-CM | POA: Diagnosis not present

## 2022-12-01 DIAGNOSIS — E785 Hyperlipidemia, unspecified: Secondary | ICD-10-CM | POA: Diagnosis not present

## 2022-12-01 DIAGNOSIS — I10 Essential (primary) hypertension: Secondary | ICD-10-CM | POA: Diagnosis not present

## 2022-12-01 DIAGNOSIS — Z9181 History of falling: Secondary | ICD-10-CM | POA: Diagnosis not present

## 2022-12-01 DIAGNOSIS — S82041D Displaced comminuted fracture of right patella, subsequent encounter for closed fracture with routine healing: Secondary | ICD-10-CM | POA: Diagnosis not present

## 2022-12-06 ENCOUNTER — Other Ambulatory Visit (INDEPENDENT_AMBULATORY_CARE_PROVIDER_SITE_OTHER): Payer: Medicare PPO

## 2022-12-06 ENCOUNTER — Ambulatory Visit: Payer: Medicare PPO | Admitting: Orthopedic Surgery

## 2022-12-06 DIAGNOSIS — S82041D Displaced comminuted fracture of right patella, subsequent encounter for closed fracture with routine healing: Secondary | ICD-10-CM | POA: Diagnosis not present

## 2022-12-06 NOTE — Progress Notes (Signed)
Orthopedic Surgery Progress Note     Assessment: Patient is a 77 y.o. female with right patella fracture status post partial patellectomy  Surgery date: 09/08/2022 (~12 weeks post-op)     Plan: -Operative plans complete -Pain control: OTC medications -Weight-bear as tolerated with Bledsoe brace (from 0-90 degrees), encouraged her to work on active range of motion everyday when at home. Will discontinue brace at next visit -Okay to let soap and water run over the incision and submerge at this point -Outpatient referral to PT -Transition to cane use at this time -Return to clinic in 2 weeks, XRs at next visit: none   ___________________________________________________________________________   Subjective: Patient has been doing well since she was last seen.  She is not having any pain.  She has been working with home health PT.  She has been working on active range of motion and started doing some quad strengthening.  Has been ambulating with a walker for the most part.  Denies paresthesias and numbness.   Physical Exam:   General: no acute distress, appears stated age Neurologic: alert, answering questions appropriately, following commands Respiratory: unlabored breathing on room air, symmetric chest rise Psychiatric: appropriate affect, normal cadence to speech   MSK:    -Right lower extremity             Incisions appear well healed with no evidence of infection             Can actively extend knee against gravity with 5 degree extensor lag  Knee flexion to 75 degrees actively EHL/TA/GSC intact Plantarflexes and dorsiflexes toes Sensation intact to light touch in sural, saphenous, tibial, deep peroneal, and superficial peroneal nerve distributions Foot warm and well perfused   XR of the right knee taken on 12/06/2022 and independently reviewed today, showing stable position of the patella with inferior pole excised.  Calcific fragments distal and superficial to the patella.   Transverse fracture through the patella no longer visualized.  No new fracture seen.   Patient name: Danielle Yoder Patient MRN: [id number] Date: 12/06/2022

## 2022-12-08 DIAGNOSIS — Z9181 History of falling: Secondary | ICD-10-CM | POA: Diagnosis not present

## 2022-12-08 DIAGNOSIS — E785 Hyperlipidemia, unspecified: Secondary | ICD-10-CM | POA: Diagnosis not present

## 2022-12-08 DIAGNOSIS — I10 Essential (primary) hypertension: Secondary | ICD-10-CM | POA: Diagnosis not present

## 2022-12-08 DIAGNOSIS — S82041D Displaced comminuted fracture of right patella, subsequent encounter for closed fracture with routine healing: Secondary | ICD-10-CM | POA: Diagnosis not present

## 2022-12-09 ENCOUNTER — Ambulatory Visit
Admission: RE | Admit: 2022-12-09 | Discharge: 2022-12-09 | Disposition: A | Discharge status: Home / Self Care | Payer: Medicare PPO | Source: Ambulatory Visit | Attending: Family Medicine | Admitting: Family Medicine

## 2022-12-09 DIAGNOSIS — E785 Hyperlipidemia, unspecified: Secondary | ICD-10-CM | POA: Diagnosis not present

## 2022-12-09 DIAGNOSIS — S82041D Displaced comminuted fracture of right patella, subsequent encounter for closed fracture with routine healing: Secondary | ICD-10-CM | POA: Diagnosis not present

## 2022-12-09 DIAGNOSIS — I10 Essential (primary) hypertension: Secondary | ICD-10-CM | POA: Diagnosis not present

## 2022-12-09 DIAGNOSIS — Z1231 Encounter for screening mammogram for malignant neoplasm of breast: Secondary | ICD-10-CM

## 2022-12-09 DIAGNOSIS — Z9181 History of falling: Secondary | ICD-10-CM | POA: Diagnosis not present

## 2022-12-15 ENCOUNTER — Other Ambulatory Visit: Payer: Self-pay

## 2022-12-15 ENCOUNTER — Ambulatory Visit: Payer: Medicare PPO | Attending: Orthopedic Surgery

## 2022-12-15 DIAGNOSIS — S82041D Displaced comminuted fracture of right patella, subsequent encounter for closed fracture with routine healing: Secondary | ICD-10-CM | POA: Diagnosis not present

## 2022-12-15 DIAGNOSIS — R262 Difficulty in walking, not elsewhere classified: Secondary | ICD-10-CM

## 2022-12-15 DIAGNOSIS — S76111D Strain of right quadriceps muscle, fascia and tendon, subsequent encounter: Secondary | ICD-10-CM

## 2022-12-15 DIAGNOSIS — S82041A Displaced comminuted fracture of right patella, initial encounter for closed fracture: Secondary | ICD-10-CM

## 2022-12-15 DIAGNOSIS — M25661 Stiffness of right knee, not elsewhere classified: Secondary | ICD-10-CM

## 2022-12-15 NOTE — Therapy (Signed)
OUTPATIENT PHYSICAL THERAPY LOWER EXTREMITY EVALUATION   Patient Name: Danielle Yoder MRN: [id number] DOB:[birth year], 77 y.o., female Today's Date: 12/15/2022  END OF SESSION:  PT End of Session - 12/15/22 1700     Visit Number 1    Date for PT Re-Evaluation 03/09/23    PT Start Time 1440    PT Stop Time [id number]    PT Time Calculation (min) 37 min    Activity Tolerance Patient tolerated treatment well    Behavior During Therapy WFL for tasks assessed/performed             Past Medical History:  Diagnosis Date   Allergy    RHINITIS   Arthritis    Colonic polyp    Dyslipidemia    Hemorrhoids    Hypertension    Past Surgical History:  Procedure Laterality Date   FOOT SURGERY Right    bone shaved off; 2 surgeries (recurred 8 yrs later)   FOOT SURGERY Left    bone shaved off ; 2 surgeries (recurred)   ORIF PATELLA Right 09/08/2022   Procedure: OPEN REDUCTION INTERNAL FIXATION (ORIF) PATELLA;  Surgeon: Callie Fielding, MD;  Location: [facility];  Service: Orthopedics;  Laterality: Right;   Patient Active Problem List   Diagnosis Date Noted   Patellar fracture 09/08/2022   Unspecified fracture of right patella, initial encounter for closed fracture 09/07/2022   Osteopenia 02/17/2018   Family history of heart disease in female family member before age 38 05/31/2011   Arthritis 05/31/2011   Hyperlipidemia with target LDL less than 100 05/31/2011   Hypertension 05/31/2011   Allergic rhinitis due to pollen 05/31/2011   History of colonic polyps 05/31/2011    PCP: Jill Alexanders, MD  REFERRING PROVIDER: Ileene Rubens, MD  REFERRING DIAG: Closed displaced comminuted fracture of right patella with routine healing, subsequent encounter, partial patellectomy.  THERAPY DIAG:  Displaced comminuted fracture of right patella, initial encounter for closed fracture  Difficulty in walking, not elsewhere classified  Stiffness of right knee, not elsewhere classified  Strain of  right quadriceps muscle, fascia and tendon, subsequent encounter  Rationale for Evaluation and Treatment: Rehabilitation  ONSET DATE: 09/08/22  SUBJECTIVE:   SUBJECTIVE STATEMENT: The patient is very active, motivated, [facility], driving, regain her independence.  Reports no real pain, just twinges sometimes.  C/o brace slides down a lot.  PERTINENT HISTORY: [place] at home on corner of air mattress at [employer], landed on R knee fractured R patella, underwent surgery to debride and repair.  Has undergone home health PT, now referred to outpatient PT to continue rehab PAIN:  Are you having pain? Yes: NPRS scale: 0/10 Pain location: R ant knee Pain description: occasional twinges Aggravating factors: certain movements Relieving factors: resting  PRECAUTIONS: Other: from MD referral; Work on Right knee active range of motion and quad strengtheningWork on Right knee active range of motion and quad strengthening  WEIGHT BEARING RESTRICTIONS: No  FALLS:  Has patient fallen in last 6 months? Yes. Number of falls 1  LIVING ENVIRONMENT: Lives with: lives with their family and lives with their spouse Lives in: House/apartment Stairs: Yes: Internal: 11 steps; on right going up Has following equipment at home: Single point cane and Walker - 2 wheeled  OCCUPATION: retired  PLOF: Independent  PATIENT GOALS: return to I lifestyle  NEXT MD VISIT: December 22 2022  OBJECTIVE:   DIAGNOSTIC FINDINGS: na  PATIENT SURVEYS:  FOTO 54  COGNITION:  Overall cognitive status: Within functional limits for tasks assessed     SENSATION: WFL  EDEMA:  None noted POSTURE: weight shift left  PALPATION: Thickened R quads, distally especially at patellar attachments, patella with good multidirectional mobility  LOWER EXTREMITY ROM:all wnl except R knee flexion in sitting 79, flexion supine to 70 Extension in supine -7   LOWER EXTREMITY MMT: R quads with 20 degree  lag for LAQ R SLR with 10 degree lag  R hamstring 4- Hip extension, abduction wnl R plantarflexors wnl     FUNCTIONAL TESTS:  Functional gait assessment: 11    GAIT: Distance walked: within department Assistive device utilized: Single point cane Level of assistance: SBA Comments: using knee brace which was sitting on top of R foot and hindering, circumduction R LE noted   TODAY'S TREATMENT:                                                                                                                              DATE: 12/15/22: Inst in therex as indicated below. Removed brace for the exercises Also bridging 10 reps on mat. Also patellar mobs    PATIENT EDUCATION:  Education details: POC, goals Person educated: Patient and Spouse Education method: Explanation, Demonstration, Tactile cues, Verbal cues, and Handouts Education comprehension: verbalized understanding, returned demonstration, verbal cues required, tactile cues required, and needs further education  HOME EXERCISE PROGRAM: Access Code: [email or web address] URL: [email or web address] Date: 12/15/2022 Prepared by: Warren Lacy Conway Fedora  Exercises - Supine Heel Slide with Strap  - 1 x daily - 7 x weekly - 3 sets - 10 reps - Supine Straight Leg Raises  - 1 x daily - 7 x weekly - 3 sets - 10 reps - Seated Long Arc Quad  - 1 x daily - 7 x weekly - 3 sets - 10 reps  ASSESSMENT:  CLINICAL IMPRESSION: Patient is a 77 y.o. female who was seen today for physical therapy evaluation and treatment for rehabilitation of function for R LE after a traumatic fracture R patella 3 1/2 months ago.  Presents with decreased flexibility R knee for flexion and ext as well as gross quads and hamstring weakness. Currently having to use locking knee brace which fits poorly and gait is inefficient and labored.  Will benefit from skilled physical therapy to address her deficits and assist her with improving / regaining her quality of life and function.    OBJECTIVE IMPAIRMENTS: Abnormal gait, decreased activity tolerance, decreased endurance, decreased mobility, difficulty walking, decreased ROM, decreased strength, increased fascial restrictions, improper body mechanics, and pain.   ACTIVITY LIMITATIONS: carrying, lifting, bending, sitting, squatting, and locomotion level  PARTICIPATION LIMITATIONS: meal prep, cleaning, laundry, driving, shopping, community activity, and yard work  PERSONAL FACTORS: Age, Time since onset of injury/illness/exacerbation, and 1 comorbidity: HTN  are also affecting patient's functional outcome.   REHAB POTENTIAL: Good  CLINICAL DECISION MAKING: Stable/uncomplicated  EVALUATION COMPLEXITY: Low   GOALS: Goals reviewed with patient? Yes  SHORT TERM GOALS: Target date: 12/29/22 I HEP Baseline:established today Goal status: INITIAL  2.  Improve R knee ROM to 0- 90 Baseline: -6 to 80 Goal status: INITIAL  LONG TERM GOALS: Target date: 03/09/23  Improve FGA from 11 to 28 Baseline: 11 Goal status: INITIAL  2.  Foto 61 Baseline: 54 Goal status: INITIAL  3.  R quads strength 4+/5 for I transfers, gait efficiency, stairs, uneven terrain Baseline: 3-/5 Goal status: INITIAL    PLAN:  PT FREQUENCY: 2x/week  PT DURATION: 12 weeks  PLANNED INTERVENTIONS: Therapeutic exercises, Therapeutic activity, Neuromuscular re-education, Balance training, Gait training, Patient/Family education, Self Care, and Joint mobilization  PLAN FOR NEXT SESSION: progress with gentle stretching R LE, consider NMES R quads, add Nustep, modalities, manual techniques as needed    Cederic Mozley L Annalisse Minkoff, PT 12/15/2022, 5:06 PM

## 2022-12-22 ENCOUNTER — Ambulatory Visit (INDEPENDENT_AMBULATORY_CARE_PROVIDER_SITE_OTHER): Payer: Medicare PPO | Admitting: Orthopedic Surgery

## 2022-12-22 DIAGNOSIS — S82041D Displaced comminuted fracture of right patella, subsequent encounter for closed fracture with routine healing: Secondary | ICD-10-CM | POA: Diagnosis not present

## 2022-12-22 NOTE — Progress Notes (Signed)
Orthopedic Surgery Progress Note     Assessment: Patient is a 77 y.o. female with right patella fracture status post partial patellectomy  Surgery date: 09/08/2022 (~14 weeks post-op)     Plan: -Operative plans complete -Pain control: OTC medications -Weight-bear as tolerated, no brace -Discontinue ambulatory aids, return to activity as tolerated -PT can work on active and passive range of motion at the knee -Provided her with several exercises to work on knee flexion that I told her she should be doing every day -Return to clinic in 2 weeks, XRs at next visit: none   ___________________________________________________________________________   Subjective: Patient is still not having any pain in her knee. Her husband states she has been hesitant to flex the knee to avoid inuring the repair. Has been walking and going up/down stairs without issue. Denies paresthesias and numbness.    Physical Exam:   General: no acute distress, appears stated age Neurologic: alert, answering questions appropriately, following commands Respiratory: unlabored breathing on room air, symmetric chest rise Psychiatric: appropriate affect, normal cadence to speech   MSK:    -Right lower extremity             Incisions appear well healed with no evidence of infection             Can actively extend knee against gravity with 5 degree extensor lag             Passive knee flexion to 80 degrees EHL/TA/GSC intact Plantarflexes and dorsiflexes toes Sensation intact to light touch in sural, saphenous, tibial, deep peroneal, and superficial peroneal nerve distributions Foot warm and well perfused  Imaging:  None obtained today   Patient name: Danielle Yoder Patient MRN: [id number] Date: 12/22/22

## 2022-12-24 ENCOUNTER — Ambulatory Visit: Payer: Medicare PPO | Admitting: Physical Therapy

## 2022-12-24 DIAGNOSIS — S82041A Displaced comminuted fracture of right patella, initial encounter for closed fracture: Secondary | ICD-10-CM | POA: Diagnosis not present

## 2022-12-24 DIAGNOSIS — M25661 Stiffness of right knee, not elsewhere classified: Secondary | ICD-10-CM | POA: Diagnosis not present

## 2022-12-24 DIAGNOSIS — S82041D Displaced comminuted fracture of right patella, subsequent encounter for closed fracture with routine healing: Secondary | ICD-10-CM | POA: Diagnosis not present

## 2022-12-24 DIAGNOSIS — R262 Difficulty in walking, not elsewhere classified: Secondary | ICD-10-CM

## 2022-12-24 DIAGNOSIS — S76111D Strain of right quadriceps muscle, fascia and tendon, subsequent encounter: Secondary | ICD-10-CM

## 2022-12-24 NOTE — Therapy (Signed)
OUTPATIENT PHYSICAL THERAPY LOWER EXTREMITY TREATMENT   Patient Name: Danielle Yoder MRN: [id number] DOB:01-Jul-1946, 77 y.o., female Today's Date: 12/24/2022  END OF SESSION:  PT End of Session - 12/24/22 1011     Visit Number 2    Date for PT Re-Evaluation 03/09/23    PT Start Time 1015    PT Stop Time 1100    PT Time Calculation (min) 45 min    Activity Tolerance Patient tolerated treatment well    Behavior During Therapy WFL for tasks assessed/performed             Past Medical History:  Diagnosis Date   Allergy    RHINITIS   Arthritis    Colonic polyp    Dyslipidemia    Hemorrhoids    Hypertension    Past Surgical History:  Procedure Laterality Date   FOOT SURGERY Right    bone shaved off; 2 surgeries (recurred 8 yrs later)   FOOT SURGERY Left    bone shaved off ; 2 surgeries (recurred)   ORIF PATELLA Right 09/08/2022   Procedure: OPEN REDUCTION INTERNAL FIXATION (ORIF) PATELLA;  Surgeon: London Sheer, MD;  Location: [facility];  Service: Orthopedics;  Laterality: Right;   Patient Active Problem List   Diagnosis Date Noted   Patellar fracture 09/08/2022   Unspecified fracture of right patella, initial encounter for closed fracture 09/07/2022   Osteopenia 02/17/2018   Family history of heart disease in female family member before age 64 05/31/2011   Arthritis 05/31/2011   Hyperlipidemia with target LDL less than 100 05/31/2011   Hypertension 05/31/2011   Allergic rhinitis due to pollen 05/31/2011   History of colonic polyps 05/31/2011    PCP: Sharlot Gowda, MD  REFERRING PROVIDER: Willia Craze, MD  REFERRING DIAG: Closed displaced comminuted fracture of right patella with routine healing, subsequent encounter, partial patellectomy.  THERAPY DIAG:  Displaced comminuted fracture of right patella, initial encounter for closed fracture  Difficulty in walking, not elsewhere classified  Stiffness of right knee, not elsewhere classified  Strain of  right quadriceps muscle, fascia and tendon, subsequent encounter  Rationale for Evaluation and Treatment: Rehabilitation  ONSET DATE: 09/08/22  SUBJECTIVE:   SUBJECTIVE STATEMENT: MD took brace and cane wednesday  PERTINENT HISTORY: Fell at home on corner of air mattress at [employer], landed on R knee fractured R patella, underwent surgery to debride and repair.  Has undergone home health PT, now referred to outpatient PT to continue rehab PAIN:  Are you having pain? Yes: NPRS scale: 0/10 Pain location: R ant knee Pain description: occasional twinges Aggravating factors: certain movements Relieving factors: resting  PRECAUTIONS: Other: from MD referral; Work on Right knee active range of motion and quad strengtheningWork on Right knee active range of motion and quad strengthening  WEIGHT BEARING RESTRICTIONS: No  FALLS:  Has patient fallen in last 6 months? Yes. Number of falls 1  LIVING ENVIRONMENT: Lives with: lives with their family and lives with their spouse Lives in: House/apartment Stairs: Yes: Internal: 11 steps; on right going up Has following equipment at home: Single point cane and Walker - 2 wheeled  OCCUPATION: retired  PLOF: Independent  PATIENT GOALS: return to I lifestyle  NEXT MD VISIT: December 22 2022  OBJECTIVE:   DIAGNOSTIC FINDINGS: na  PATIENT SURVEYS:  FOTO 71  COGNITION: Overall cognitive status: Within functional limits for tasks assessed     SENSATION: WFL  EDEMA:  None noted POSTURE: weight shift left  PALPATION: Thickened R  quads, distally especially at patellar attachments, patella with good multidirectional mobility  LOWER EXTREMITY ROM:all wnl except R knee flexion in sitting 79, flexion supine to 70 Extension in supine -7   LOWER EXTREMITY MMT: R quads with 20 degree lag for LAQ R SLR with 10 degree lag  R hamstring 4- Hip extension, abduction wnl R plantarflexors wnl     FUNCTIONAL TESTS:  Functional gait  assessment: 11    GAIT: Distance walked: within department Assistive device utilized: Single point cane Level of assistance: SBA Comments: using knee brace which was sitting on top of R foot and hindering, circumduction R LE noted   TODAY'S TREATMENT:                                                                                                                              DATE:  12/24/22 R knee PROM Flex & Ext RLE quad sets x5 LAQ RLE 2x10 RLE HS curls 2x10 red  NuStep L 3 x 4 min  Standing march 2x10 SAQ RLE 1lb 2x10 RLE SLR x10, x5 R knee PROM Flex & Ext  12/15/22: Inst in therex as indicated below. Removed brace for the exercises Also bridging 10 reps on mat. Also patellar mobs    PATIENT EDUCATION:  Education details: POC, goals Person educated: Patient and Spouse Education method: Explanation, Demonstration, Tactile cues, Verbal cues, and Handouts Education comprehension: verbalized understanding, returned demonstration, verbal cues required, tactile cues required, and needs further education  HOME EXERCISE PROGRAM: Access Code: [email or web address] URL: [email or web address] Date: 12/15/2022 Prepared by: Linton Rump Speaks  Exercises - Supine Heel Slide with Strap  - 1 x daily - 7 x weekly - 3 sets - 10 reps - Supine Straight Leg Raises  - 1 x daily - 7 x weekly - 3 sets - 10 reps - Seated Long Arc Quad  - 1 x daily - 7 x weekly - 3 sets - 10 reps  ASSESSMENT:  CLINICAL IMPRESSION: Patient is a 77 y.o. female who was seen today for physical therapy treatment for rehabilitation of function for R LE after a traumatic fracture R patella 3 1/2 months ago.  Pt enters ambulating without AD or brace, Stating the MD took the walker and brace form her last week. Pt ambulated with rigid RLE but doe have adequate ROM from normal gait. Some pain reported a the end range of flexion. Difficulty activating quad with quad set but did well with long and short arc quad. Compensation noted  with sit to stand. Good effort throughout session.  OBJECTIVE IMPAIRMENTS: Abnormal gait, decreased activity tolerance, decreased endurance, decreased mobility, difficulty walking, decreased ROM, decreased strength, increased fascial restrictions, improper body mechanics, and pain.   ACTIVITY LIMITATIONS: carrying, lifting, bending, sitting, squatting, and locomotion level  PARTICIPATION LIMITATIONS: meal prep, cleaning, laundry, driving, shopping, community activity, and yard work  PERSONAL FACTORS: Age, Time since onset of injury/illness/exacerbation, and 1 comorbidity: HTN  are also affecting patient's functional  outcome.   REHAB POTENTIAL: Good  CLINICAL DECISION MAKING: Stable/uncomplicated  EVALUATION COMPLEXITY: Low   GOALS: Goals reviewed with patient? Yes  SHORT TERM GOALS: Target date: 12/29/22 I HEP Baseline:established today Goal status: INITIAL  2.  Improve R knee ROM to 0- 90 Baseline: -6 to 80 Goal status: INITIAL  LONG TERM GOALS: Target date: 03/09/23  Improve FGA from 11 to 28 Baseline: 11 Goal status: INITIAL  2.  Foto 61 Baseline: 54 Goal status: INITIAL  3.  R quads strength 4+/5 for I transfers, gait efficiency, stairs, uneven terrain Baseline: 3-/5 Goal status: INITIAL    PLAN:  PT FREQUENCY: 2x/week  PT DURATION: 12 weeks  PLANNED INTERVENTIONS: Therapeutic exercises, Therapeutic activity, Neuromuscular re-education, Balance training, Gait training, Patient/Family education, Self Care, and Joint mobilization  PLAN FOR NEXT SESSION: progress with gentle stretching R LE, consider NMES R quads, add Nustep, modalities, manual techniques as needed    Grayce Sessions, PTA 12/24/2022, 10:16 AM

## 2022-12-27 ENCOUNTER — Ambulatory Visit: Payer: Medicare PPO

## 2022-12-27 ENCOUNTER — Other Ambulatory Visit: Payer: Self-pay

## 2022-12-27 DIAGNOSIS — S76111D Strain of right quadriceps muscle, fascia and tendon, subsequent encounter: Secondary | ICD-10-CM

## 2022-12-27 DIAGNOSIS — S82041D Displaced comminuted fracture of right patella, subsequent encounter for closed fracture with routine healing: Secondary | ICD-10-CM | POA: Diagnosis not present

## 2022-12-27 DIAGNOSIS — R262 Difficulty in walking, not elsewhere classified: Secondary | ICD-10-CM

## 2022-12-27 DIAGNOSIS — M25661 Stiffness of right knee, not elsewhere classified: Secondary | ICD-10-CM | POA: Diagnosis not present

## 2022-12-27 DIAGNOSIS — S82041A Displaced comminuted fracture of right patella, initial encounter for closed fracture: Secondary | ICD-10-CM | POA: Diagnosis not present

## 2022-12-27 NOTE — Therapy (Signed)
OUTPATIENT PHYSICAL THERAPY LOWER EXTREMITY TREATMENT   Patient Name: Danielle Yoder MRN: [id number] DOB:Jun 21, 1946, 77 y.o., female Today's Date: 12/27/2022  END OF SESSION:  PT End of Session - 12/27/22 1728     Visit Number 3    Date for PT Re-Evaluation 03/09/23    PT Start Time 1515    PT Stop Time 1600    PT Time Calculation (min) 45 min    Activity Tolerance Patient tolerated treatment well    Behavior During Therapy WFL for tasks assessed/performed              Past Medical History:  Diagnosis Date   Allergy    RHINITIS   Arthritis    Colonic polyp    Dyslipidemia    Hemorrhoids    Hypertension    Past Surgical History:  Procedure Laterality Date   FOOT SURGERY Right    bone shaved off; 2 surgeries (recurred 8 yrs later)   FOOT SURGERY Left    bone shaved off ; 2 surgeries (recurred)   ORIF PATELLA Right 09/08/2022   Procedure: OPEN REDUCTION INTERNAL FIXATION (ORIF) PATELLA;  Surgeon: London Sheer, MD;  Location: [facility];  Service: Orthopedics;  Laterality: Right;   Patient Active Problem List   Diagnosis Date Noted   Patellar fracture 09/08/2022   Unspecified fracture of right patella, initial encounter for closed fracture 09/07/2022   Osteopenia 02/17/2018   Family history of heart disease in female family member before age 34 05/31/2011   Arthritis 05/31/2011   Hyperlipidemia with target LDL less than 100 05/31/2011   Hypertension 05/31/2011   Allergic rhinitis due to pollen 05/31/2011   History of colonic polyps 05/31/2011    PCP: Sharlot Gowda, MD  REFERRING PROVIDER: Willia Craze, MD  REFERRING DIAG: Closed displaced comminuted fracture of right patella with routine healing, subsequent encounter, partial patellectomy.  THERAPY DIAG:  Displaced comminuted fracture of right patella, initial encounter for closed fracture  Difficulty in walking, not elsewhere classified  Stiffness of right knee, not elsewhere classified  Strain of  right quadriceps muscle, fascia and tendon, subsequent encounter  Rationale for Evaluation and Treatment: Rehabilitation  ONSET DATE: 09/08/22  SUBJECTIVE:   SUBJECTIVE STATEMENT:  Stiff, I wish the swelling would go away in my R knee and lower leg  PERTINENT HISTORY: Fell at home on corner of air mattress at [employer], landed on R knee fractured R patella, underwent surgery to debride and repair.  Has undergone home health PT, now referred to outpatient PT to continue rehab PAIN:  Are you having pain? Yes: NPRS scale: 0/10 Pain location: R ant knee Pain description: occasional twinges Aggravating factors: certain movements Relieving factors: resting  PRECAUTIONS: Other: from MD referral; Work on Right knee active range of motion and quad strengtheningWork on Right knee active range of motion and quad strengthening  WEIGHT BEARING RESTRICTIONS: No  FALLS:  Has patient fallen in last 6 months? Yes. Number of falls 1  LIVING ENVIRONMENT: Lives with: lives with their family and lives with their spouse Lives in: House/apartment Stairs: Yes: Internal: 11 steps; on right going up Has following equipment at home: Single point cane and Walker - 2 wheeled  OCCUPATION: retired  PLOF: Independent  PATIENT GOALS: return to I lifestyle  NEXT MD VISIT: December 22 2022  OBJECTIVE:   DIAGNOSTIC FINDINGS: na  PATIENT SURVEYS:  FOTO 82  COGNITION: Overall cognitive status: Within functional limits for tasks assessed     SENSATION: WFL  EDEMA:  None noted POSTURE: weight shift left  PALPATION: Thickened R quads, distally especially at patellar attachments, patella with good multidirectional mobility  LOWER EXTREMITY ROM:all wnl except R knee flexion in sitting 79, flexion supine to 70 Extension in supine -7   LOWER EXTREMITY MMT: R quads with 20 degree lag for LAQ R SLR with 10 degree lag  R hamstring 4- Hip extension, abduction wnl R plantarflexors wnl      FUNCTIONAL TESTS:  Functional gait assessment: 11    GAIT: Distance walked: within department Assistive device utilized: Single point cane Level of assistance: SBA Comments: using knee brace which was sitting on top of R foot and hindering, circumduction R LE noted   TODAY'S TREATMENT:                                                                                                                              DATE:  12/27/22: Gait training, no device or brace. Emphasized elongated stride L LE and rolling off toe R Lunges on 6" step with B UE support for AAROM Supine for patellar mobs,  Supine AAROM R knee flexion 75 degrees Prone TKE's 5 sec holds 15 x Supine R SLR 10x,measured R knee extension -3 today Nustep level 3, 5 min, Ue's and LE"s Long arc quads 2# 10 reps x 2 Seated AAROM R knee flexion, with contract relax, measured flex 82 in sitting Stanidng TKE's with ball behind R knee 5 sec holds,10 reps   12/24/22 R knee PROM Flex & Ext RLE quad sets x5 LAQ RLE 2x10 RLE HS curls 2x10 red  NuStep L 3 x 4 min  Standing march 2x10 SAQ RLE 1lb 2x10 RLE SLR x10, x5 R knee PROM Flex & Ext  12/15/22: Inst in therex as indicated below. Removed brace for the exercises Also bridging 10 reps on mat. Also patellar mobs    PATIENT EDUCATION:  Education details: POC, goals Person educated: Patient and Spouse Education method: Explanation, Demonstration, Tactile cues, Verbal cues, and Handouts Education comprehension: verbalized understanding, returned demonstration, verbal cues required, tactile cues required, and needs further education  HOME EXERCISE PROGRAM: Access Code: [email or web address] URL: [email or web address] Date: 12/15/2022 Prepared by: Linton Rump Joyelle Siedlecki  Exercises - Supine Heel Slide with Strap  - 1 x daily - 7 x weekly - 3 sets - 10 reps - Supine Straight Leg Raises  - 1 x daily - 7 x weekly - 3 sets - 10 reps - Seated Long Arc Quad  - 1 x daily - 7 x weekly - 3  sets - 10 reps  ASSESSMENT:  CLINICAL IMPRESSION: Patient is a 77 y.o. female who was seen today for physical therapy treatment for rehabilitation of function for R LE after a traumatic fracture R patella 3 1/2 months ago.  Progressing today with difficulty and strength demands with open and closed chain strengthening.increased ROM noted today.  Still limited recruitment quads R   OBJECTIVE IMPAIRMENTS: Abnormal gait, decreased activity  tolerance, decreased endurance, decreased mobility, difficulty walking, decreased ROM, decreased strength, increased fascial restrictions, improper body mechanics, and pain.   ACTIVITY LIMITATIONS: carrying, lifting, bending, sitting, squatting, and locomotion level  PARTICIPATION LIMITATIONS: meal prep, cleaning, laundry, driving, shopping, community activity, and yard work  PERSONAL FACTORS: Age, Time since onset of injury/illness/exacerbation, and 1 comorbidity: HTN  are also affecting patient's functional outcome.   REHAB POTENTIAL: Good  CLINICAL DECISION MAKING: Stable/uncomplicated  EVALUATION COMPLEXITY: Low   GOALS: Goals reviewed with patient? Yes  SHORT TERM GOALS: Target date: 12/29/22 I HEP Baseline:established today Goal status: INITIAL  2.  Improve R knee ROM to 0- 90 Baseline: -6 to 80 Goal status: INITIAL  LONG TERM GOALS: Target date: 03/09/23  Improve FGA from 11 to 28 Baseline: 11 Goal status: INITIAL  2.  Foto 61 Baseline: 54 Goal status: INITIAL  3.  R quads strength 4+/5 for I transfers, gait efficiency, stairs, uneven terrain Baseline: 3-/5 Goal status: INITIAL    PLAN:  PT FREQUENCY: 2x/week  PT DURATION: 12 weeks  PLANNED INTERVENTIONS: Therapeutic exercises, Therapeutic activity, Neuromuscular re-education, Balance training, Gait training, Patient/Family education, Self Care, and Joint mobilization  PLAN FOR NEXT SESSION: progress with gentle stretching R LE, consider NMES R quads, add Nustep,  modalities, manual techniques as needed    Alondra Sahni L Xandrea Clarey, PT 12/27/2022, 5:33 PM

## 2022-12-29 ENCOUNTER — Ambulatory Visit: Payer: Medicare PPO | Admitting: Physical Therapy

## 2022-12-29 ENCOUNTER — Encounter: Payer: Self-pay | Admitting: Physical Therapy

## 2022-12-29 DIAGNOSIS — S82041D Displaced comminuted fracture of right patella, subsequent encounter for closed fracture with routine healing: Secondary | ICD-10-CM | POA: Diagnosis not present

## 2022-12-29 DIAGNOSIS — R262 Difficulty in walking, not elsewhere classified: Secondary | ICD-10-CM

## 2022-12-29 DIAGNOSIS — S82041A Displaced comminuted fracture of right patella, initial encounter for closed fracture: Secondary | ICD-10-CM

## 2022-12-29 DIAGNOSIS — M25661 Stiffness of right knee, not elsewhere classified: Secondary | ICD-10-CM

## 2022-12-29 DIAGNOSIS — S76111D Strain of right quadriceps muscle, fascia and tendon, subsequent encounter: Secondary | ICD-10-CM | POA: Diagnosis not present

## 2022-12-29 NOTE — Therapy (Signed)
OUTPATIENT PHYSICAL THERAPY LOWER EXTREMITY TREATMENT   Patient Name: Danielle Yoder MRN: [id number] DOB:20-Mar-1946, 77 y.o., female Today's Date: 12/29/2022  END OF SESSION:  PT End of Session - 12/29/22 1140     Visit Number 4    Date for PT Re-Evaluation 03/09/23    PT Start Time 1145    PT Stop Time 1230    PT Time Calculation (min) 45 min    Activity Tolerance Patient tolerated treatment well    Behavior During Therapy WFL for tasks assessed/performed              Past Medical History:  Diagnosis Date   Allergy    RHINITIS   Arthritis    Colonic polyp    Dyslipidemia    Hemorrhoids    Hypertension    Past Surgical History:  Procedure Laterality Date   FOOT SURGERY Right    bone shaved off; 2 surgeries (recurred 8 yrs later)   FOOT SURGERY Left    bone shaved off ; 2 surgeries (recurred)   ORIF PATELLA Right 09/08/2022   Procedure: OPEN REDUCTION INTERNAL FIXATION (ORIF) PATELLA;  Surgeon: London Sheer, MD;  Location: [facility];  Service: Orthopedics;  Laterality: Right;   Patient Active Problem List   Diagnosis Date Noted   Patellar fracture 09/08/2022   Unspecified fracture of right patella, initial encounter for closed fracture 09/07/2022   Osteopenia 02/17/2018   Family history of heart disease in female family member before age 29 05/31/2011   Arthritis 05/31/2011   Hyperlipidemia with target LDL less than 100 05/31/2011   Hypertension 05/31/2011   Allergic rhinitis due to pollen 05/31/2011   History of colonic polyps 05/31/2011    PCP: Sharlot Gowda, MD  REFERRING PROVIDER: Willia Craze, MD  REFERRING DIAG: Closed displaced comminuted fracture of right patella with routine healing, subsequent encounter, partial patellectomy.  THERAPY DIAG:  Displaced comminuted fracture of right patella, initial encounter for closed fracture  Difficulty in walking, not elsewhere classified  Stiffness of right knee, not elsewhere classified  Strain of  right quadriceps muscle, fascia and tendon, subsequent encounter  Rationale for Evaluation and Treatment: Rehabilitation  ONSET DATE: 09/08/22  SUBJECTIVE:   SUBJECTIVE STATEMENT: "I am doing ok"  PERTINENT HISTORY: Fell at home on corner of air mattress at [employer], landed on R knee fractured R patella, underwent surgery to debride and repair.  Has undergone home health PT, now referred to outpatient PT to continue rehab PAIN:  Are you having pain? Yes: NPRS scale: 0/10 Pain location: R ant knee Pain description: occasional twinges Aggravating factors: certain movements Relieving factors: resting  PRECAUTIONS: Other: from MD referral; Work on Right knee active range of motion and quad strengtheningWork on Right knee active range of motion and quad strengthening  WEIGHT BEARING RESTRICTIONS: No  FALLS:  Has patient fallen in last 6 months? Yes. Number of falls 1  LIVING ENVIRONMENT: Lives with: lives with their family and lives with their spouse Lives in: House/apartment Stairs: Yes: Internal: 11 steps; on right going up Has following equipment at home: Single point cane and Walker - 2 wheeled  OCCUPATION: retired  PLOF: Independent  PATIENT GOALS: return to I lifestyle  NEXT MD VISIT: December 22 2022  OBJECTIVE:   DIAGNOSTIC FINDINGS: na  PATIENT SURVEYS:  FOTO 84  COGNITION: Overall cognitive status: Within functional limits for tasks assessed     SENSATION: WFL  EDEMA:  None noted POSTURE: weight shift left  PALPATION: Thickened R quads,  distally especially at patellar attachments, patella with good multidirectional mobility  LOWER EXTREMITY ROM:all wnl except R knee flexion in sitting 79, flexion supine to 70 Extension in supine -7   LOWER EXTREMITY MMT: R quads with 20 degree lag for LAQ R SLR with 10 degree lag  R hamstring 4- Hip extension, abduction wnl R plantarflexors wnl     FUNCTIONAL TESTS:  Functional gait assessment: 11     GAIT: Distance walked: within department Assistive device utilized: Single point cane Level of assistance: SBA Comments: using knee brace which was sitting on top of R foot and hindering, circumduction R LE noted   TODAY'S TREATMENT:                                                                                                                              DATE:  12/29/22 NuStep L5 x 6 min  Resisted gait 4 way x 3 20lb  Long arc quads 2# 10 reps x 2 Seated AAROM R knee flexion S2S 2x10 Stair training 4 in 2 rails working on alternating pattern. Hamstring curls red 2x10 Heel raises 2x10   12/27/22: Gait training, no device or brace. Emphasized elongated stride L LE and rolling off toe R Lunges on 6" step with B UE support for AAROM Supine for patellar mobs,  Supine AAROM R knee flexion 75 degrees Prone TKE's 5 sec holds 15 x Supine R SLR 10x,measured R knee extension -3 today Nustep level 3, 5 min, Ue's and LE"s Long arc quads 2# 10 reps x 2 Seated AAROM R knee flexion, with contract relax, measured flex 82 in sitting Stanidng TKE's with ball behind R knee 5 sec holds,10 reps   12/24/22 R knee PROM Flex & Ext RLE quad sets x5 LAQ RLE 2x10 RLE HS curls 2x10 red  NuStep L 3 x 4 min  Standing march 2x10 SAQ RLE 1lb 2x10 RLE SLR x10, x5 R knee PROM Flex & Ext  12/15/22: Inst in therex as indicated below. Removed brace for the exercises Also bridging 10 reps on mat. Also patellar mobs    PATIENT EDUCATION:  Education details: POC, goals Person educated: Patient and Spouse Education method: Explanation, Demonstration, Tactile cues, Verbal cues, and Handouts Education comprehension: verbalized understanding, returned demonstration, verbal cues required, tactile cues required, and needs further education  HOME EXERCISE PROGRAM: Access Code: [email or web address] URL: [email or web address] Date: 12/29/2022 Prepared by: Debroah Baller  Exercises - Supine Heel Slide  with Strap  - 1 x daily - 7 x weekly - 3 sets - 10 reps - Supine Straight Leg Raises  - 1 x daily - 7 x weekly - 3 sets - 10 reps - Seated Long Arc Quad  - 1 x daily - 7 x weekly - 3 sets - 10 reps - Sit to Stand  - 1 x daily - 7 x weekly - 3 sets - 10 reps - Standing Heel Raise with Support  - 1 x  daily - 7 x weekly - 3 sets - 10 reps ASSESSMENT:  CLINICAL IMPRESSION: Patient is a 77 y.o. female who was seen today for physical therapy treatment for rehabilitation of function for R LE after a traumatic fracture R patella 3 1/2 months ago.  Progressing today with more functional interventions. Still limited recruitment quads R. Cues need to maintain sequencing with stair training.  Some pain at end rang of passive flexion  OBJECTIVE IMPAIRMENTS: Abnormal gait, decreased activity tolerance, decreased endurance, decreased mobility, difficulty walking, decreased ROM, decreased strength, increased fascial restrictions, improper body mechanics, and pain.   ACTIVITY LIMITATIONS: carrying, lifting, bending, sitting, squatting, and locomotion level  PARTICIPATION LIMITATIONS: meal prep, cleaning, laundry, driving, shopping, community activity, and yard work  PERSONAL FACTORS: Age, Time since onset of injury/illness/exacerbation, and 1 comorbidity: HTN  are also affecting patient's functional outcome.   REHAB POTENTIAL: Good  CLINICAL DECISION MAKING: Stable/uncomplicated  EVALUATION COMPLEXITY: Low   GOALS: Goals reviewed with patient? Yes  SHORT TERM GOALS: Target date: 12/29/22 I HEP Baseline:established today Goal status: INITIAL  2.  Improve R knee ROM to 0- 90 Baseline: -6 to 80 Goal status: INITIAL  LONG TERM GOALS: Target date: 03/09/23  Improve FGA from 11 to 28 Baseline: 11 Goal status: INITIAL  2.  Foto 61 Baseline: 54 Goal status: INITIAL  3.  R quads strength 4+/5 for I transfers, gait efficiency, stairs, uneven terrain Baseline: 3-/5 Goal status:  INITIAL    PLAN:  PT FREQUENCY: 2x/week  PT DURATION: 12 weeks  PLANNED INTERVENTIONS: Therapeutic exercises, Therapeutic activity, Neuromuscular re-education, Balance training, Gait training, Patient/Family education, Self Care, and Joint mobilization  PLAN FOR NEXT SESSION: progress with gentle stretching R LE, consider NMES R quads, add Nustep, modalities, manual techniques as needed    Grayce Sessions, PTA 12/29/2022, 11:41 AM

## 2023-01-03 ENCOUNTER — Ambulatory Visit: Payer: Medicare PPO

## 2023-01-03 ENCOUNTER — Other Ambulatory Visit: Payer: Self-pay

## 2023-01-03 DIAGNOSIS — S76111D Strain of right quadriceps muscle, fascia and tendon, subsequent encounter: Secondary | ICD-10-CM | POA: Diagnosis not present

## 2023-01-03 DIAGNOSIS — S82041A Displaced comminuted fracture of right patella, initial encounter for closed fracture: Secondary | ICD-10-CM | POA: Diagnosis not present

## 2023-01-03 DIAGNOSIS — M25661 Stiffness of right knee, not elsewhere classified: Secondary | ICD-10-CM | POA: Diagnosis not present

## 2023-01-03 DIAGNOSIS — S82041D Displaced comminuted fracture of right patella, subsequent encounter for closed fracture with routine healing: Secondary | ICD-10-CM | POA: Diagnosis not present

## 2023-01-03 DIAGNOSIS — R262 Difficulty in walking, not elsewhere classified: Secondary | ICD-10-CM | POA: Diagnosis not present

## 2023-01-03 NOTE — Therapy (Signed)
OUTPATIENT PHYSICAL THERAPY LOWER EXTREMITY TREATMENT   Patient Name: Danielle Yoder MRN: [id number] DOB:[birth year], 77 y.o., female Today's Date: 01/03/2023  END OF SESSION:  PT End of Session - 01/03/23 1211     Visit Number 5    Date for PT Re-Evaluation 03/09/23    Progress Note Due on Visit 10    PT Start Time 1100    PT Stop Time 1145    PT Time Calculation (min) 45 min    Activity Tolerance Patient tolerated treatment well    Behavior During Therapy WFL for tasks assessed/performed               Past Medical History:  Diagnosis Date   Allergy    RHINITIS   Arthritis    Colonic polyp    Dyslipidemia    Hemorrhoids    Hypertension    Past Surgical History:  Procedure Laterality Date   FOOT SURGERY Right    bone shaved off; 2 surgeries (recurred 8 yrs later)   FOOT SURGERY Left    bone shaved off ; 2 surgeries (recurred)   ORIF PATELLA Right 09/08/2022   Procedure: OPEN REDUCTION INTERNAL FIXATION (ORIF) PATELLA;  Surgeon: London Sheer, MD;  Location: [facility];  Service: Orthopedics;  Laterality: Right;   Patient Active Problem List   Diagnosis Date Noted   Patellar fracture 09/08/2022   Unspecified fracture of right patella, initial encounter for closed fracture 09/07/2022   Osteopenia 02/17/2018   Family history of heart disease in female family member before age 15 05/31/2011   Arthritis 05/31/2011   Hyperlipidemia with target LDL less than 100 05/31/2011   Hypertension 05/31/2011   Allergic rhinitis due to pollen 05/31/2011   History of colonic polyps 05/31/2011    PCP: Sharlot Gowda, MD  REFERRING PROVIDER: Willia Craze, MD  REFERRING DIAG: Closed displaced comminuted fracture of right patella with routine healing, subsequent encounter, partial patellectomy.  THERAPY DIAG:  Displaced comminuted fracture of right patella, initial encounter for closed fracture  Stiffness of right knee, not elsewhere classified  Difficulty in walking,  not elsewhere classified  Strain of right quadriceps muscle, fascia and tendon, subsequent encounter  Rationale for Evaluation and Treatment: Rehabilitation  ONSET DATE: 09/08/22  SUBJECTIVE:   SUBJECTIVE STATEMENT: Just a little sore R knee, around knee cap. To see surgeon Thursday  PERTINENT HISTORY: Larey Seat at home on corner of air mattress at [employer], landed on R knee fractured R patella, underwent surgery to debride and repair.  Has undergone home health PT, now referred to outpatient PT to continue rehab PAIN:  Are you having pain? Yes: NPRS scale: 0/10 Pain location: R ant knee Pain description: occasional twinges Aggravating factors: certain movements Relieving factors: resting  PRECAUTIONS: Other: from MD referral; Work on Right knee active range of motion and quad strengtheningWork on Right knee active range of motion and quad strengthening  WEIGHT BEARING RESTRICTIONS: No  FALLS:  Has patient fallen in last 6 months? Yes. Number of falls 1  LIVING ENVIRONMENT: Lives with: lives with their family and lives with their spouse Lives in: House/apartment Stairs: Yes: Internal: 11 steps; on right going up Has following equipment at home: Single point cane and Walker - 2 wheeled  OCCUPATION: retired  PLOF: Independent  PATIENT GOALS: return to I lifestyle  NEXT MD VISIT: December 22 2022  OBJECTIVE:   DIAGNOSTIC FINDINGS: na  PATIENT SURVEYS:  FOTO 13  COGNITION: Overall cognitive status: Within functional limits for tasks assessed  SENSATION: WFL  EDEMA:  None noted POSTURE: weight shift left  PALPATION: Thickened R quads, distally especially at patellar attachments, patella with good multidirectional mobility  LOWER EXTREMITY ROM:all wnl except R knee flexion in sitting 79, flexion supine to 70 Extension in supine -7 01/03/23:  R knee flexion in sitting 84, supine to 80  LOWER EXTREMITY MMT: R quads with 20 degree lag for LAQ R SLR with 10  degree lag  R hamstring 4- Hip extension, abduction wnl R plantarflexors wnl     FUNCTIONAL TESTS:  Functional gait assessment: 11    GAIT: Distance walked: within department Assistive device utilized: Single point cane Level of assistance: SBA Comments: using knee brace which was sitting on top of R foot and hindering, circumduction R LE noted   TODAY'S TREATMENT:                                                                                                                              DATE:  01/03/23: -Nustep level 5, 6 min, Ue's and LE's -Seated R knee flexion stretch -Supine for patellar mobs, followed by assisted knee to chest, rocking mid range, contract/ relax for R knee flexion stretch, 5 sec holds, 10x -Supine SLR R, cues to engage distal quads and avoid extensor lag, initially R extensor lag greater than 10 degrees, improved with repeated tactile cues R post knee to encourage full quad set prior to lift -seated long arc quads, 3 #, therapist assist for terminal knee ext, then eccentric lowering  mass practice -gait 100' emphasis on elongated L step length  -ll bars for lateral heel taps L, needed much verbal, tactile cues to isolate R knee flex/terminal extension, needed B UE support mass practice -ll bars for L towel slides, f/b, side/side, and cw/ccw needed B UE support, mass practice  12/29/22 NuStep L5 x 6 min  Resisted gait 4 way x 3 , 20lb  Long arc quads 2# 10 reps x 2 Seated AAROM R knee flexion S2S 2x10 Stair training 4 in 2 rails working on alternating pattern. Hamstring curls red 2x10 Heel raises 2x10   12/27/22: Gait training, no device or brace. Emphasized elongated stride L LE and rolling off toe R Lunges on 6" step with B UE support for AAROM Supine for patellar mobs,  Supine AAROM R knee flexion 75 degrees Prone TKE's 5 sec holds 15 x Supine R SLR 10x,measured R knee extension -3 today Nustep level 3, 5 min, Ue's and LE"s Long arc quads 2# 10  reps x 2 Seated AAROM R knee flexion, with contract relax, measured flex 82 in sitting Stanidng TKE's with ball behind R knee 5 sec holds,10 reps   12/24/22 R knee PROM Flex & Ext RLE quad sets x5 LAQ RLE 2x10 RLE HS curls 2x10 red  NuStep L 3 x 4 min  Standing march 2x10 SAQ RLE 1lb 2x10 RLE SLR x10, x5 R knee PROM Flex & Ext  12/15/22: Inst in therex as indicated below. Removed brace for the exercises Also bridging 10 reps on mat. Also patellar mobs    PATIENT EDUCATION:  Education details: POC, goals Person educated: Patient and Spouse Education method: Explanation, Demonstration, Tactile cues, Verbal cues, and Handouts Education comprehension: verbalized understanding, returned demonstration, verbal cues required, tactile cues required, and needs further education  HOME EXERCISE PROGRAM: Access Code: [email or web address] URL: [email or web address] Date: 01/03/2023 Prepared by: Linton Rump Josiah Wojtaszek  Exercises - Supine Heel Slide with Strap  - 1 x daily - 7 x weekly - 3 sets - 10 reps - Supine Straight Leg Raises  - 1 x daily - 7 x weekly - 3 sets - 10 reps - Seated Long Arc Quad  - 1 x daily - 7 x weekly - 3 sets - 10 reps - Sit to Stand  - 1 x daily - 7 x weekly - 3 sets - 10 reps - Standing Heel Raise with Support  - 1 x daily - 7 x weekly - 3 sets - 10 reps - Side Step Down with Unilateral Counter Support  - 1 x daily - 7 x weekly - 3 sets - 10 reps  - Lower Quarter Reach Combination  - 1 x daily - 7 x weekly - 3 sets - 10 reps Access Code: [email or web address] URL: [email or web address] Date: 12/29/2022 Prepared by: Debroah Baller  Exercises - Supine Heel Slide with Strap  - 1 x daily - 7 x weekly - 3 sets - 10 reps - Supine Straight Leg Raises  - 1 x daily - 7 x weekly - 3 sets - 10 reps - Seated Long Arc Quad  - 1 x daily - 7 x weekly - 3 sets - 10 reps - Sit to Stand  - 1 x daily - 7 x weekly - 3 sets - 10 reps - Standing Heel Raise with Support  - 1 x daily - 7  x weekly - 3 sets - 10 reps ASSESSMENT:  CLINICAL IMPRESSION: Patient is a 77 y.o. female who was seen today for physical therapy treatment for rehabilitation of function for R LE after a traumatic fracture R patella 3 1/2 months ago.  She is improving with her flexibility, still has a guarded gait, locks R knee at 20 degrees flexion and circumducts R LE.  Emphasized more today control of R knee terminal ext with weight bearing on R LE to enhance her balance.  Continues to benefit from skilled PT .  OBJECTIVE IMPAIRMENTS: Abnormal gait, decreased activity tolerance, decreased endurance, decreased mobility, difficulty walking, decreased ROM, decreased strength, increased fascial restrictions, improper body mechanics, and pain.   ACTIVITY LIMITATIONS: carrying, lifting, bending, sitting, squatting, and locomotion level  PARTICIPATION LIMITATIONS: meal prep, cleaning, laundry, driving, shopping, community activity, and yard work  PERSONAL FACTORS: Age, Time since onset of injury/illness/exacerbation, and 1 comorbidity: HTN  are also affecting patient's functional outcome.   REHAB POTENTIAL: Good  CLINICAL DECISION MAKING: Stable/uncomplicated  EVALUATION COMPLEXITY: Low   GOALS: Goals reviewed with patient? Yes  SHORT TERM GOALS: Target date: 12/29/22 I HEP Baseline:established today Goal status: IN PROGRESS  2.  Improve R knee ROM to 0- 90 Baseline: -6 to 80 Goal status: IN PROGRESS 01/03/23:  flexion to 84 today  LONG TERM GOALS: Target date: 03/09/23  Improve FGA from 11 to 28 Baseline: 11 Goal status: INITIAL  2.  Foto 61 Baseline: 54 Goal status: INITIAL  3.  R quads strength 4+/5 for I transfers, gait efficiency, stairs,  uneven terrain Baseline: 3-/5 Goal status: INITIAL    PLAN:  PT FREQUENCY: 2x/week  PT DURATION: 12 weeks  PLANNED INTERVENTIONS: Therapeutic exercises, Therapeutic activity, Neuromuscular re-education, Balance training, Gait training,  Patient/Family education, Self Care, and Joint mobilization  PLAN FOR NEXT SESSION: progress with gentle stretching R LE, continue to address R LE strengthening open and closed chain   Orabelle Rylee L Eliani Leclere, PT 01/03/2023, 12:13 PM

## 2023-01-05 ENCOUNTER — Encounter: Payer: Self-pay | Admitting: Physical Therapy

## 2023-01-05 ENCOUNTER — Ambulatory Visit: Payer: Medicare PPO | Admitting: Physical Therapy

## 2023-01-05 DIAGNOSIS — S82041D Displaced comminuted fracture of right patella, subsequent encounter for closed fracture with routine healing: Secondary | ICD-10-CM | POA: Diagnosis not present

## 2023-01-05 DIAGNOSIS — M25661 Stiffness of right knee, not elsewhere classified: Secondary | ICD-10-CM

## 2023-01-05 DIAGNOSIS — S82041A Displaced comminuted fracture of right patella, initial encounter for closed fracture: Secondary | ICD-10-CM | POA: Diagnosis not present

## 2023-01-05 DIAGNOSIS — S76111D Strain of right quadriceps muscle, fascia and tendon, subsequent encounter: Secondary | ICD-10-CM

## 2023-01-05 DIAGNOSIS — R262 Difficulty in walking, not elsewhere classified: Secondary | ICD-10-CM

## 2023-01-05 NOTE — Therapy (Signed)
OUTPATIENT PHYSICAL THERAPY LOWER EXTREMITY TREATMENT   Patient Name: Danielle Yoder MRN: [id number] DOB:02-17-1946, 77 y.o., female Today's Date: 01/05/2023  END OF SESSION:  PT End of Session - 01/05/23 1056     Visit Number 6    Date for PT Re-Evaluation 03/09/23    PT Start Time 1100    PT Stop Time 1145    PT Time Calculation (min) 45 min    Activity Tolerance Patient tolerated treatment well    Behavior During Therapy WFL for tasks assessed/performed               Past Medical History:  Diagnosis Date   Allergy    RHINITIS   Arthritis    Colonic polyp    Dyslipidemia    Hemorrhoids    Hypertension    Past Surgical History:  Procedure Laterality Date   FOOT SURGERY Right    bone shaved off; 2 surgeries (recurred 8 yrs later)   FOOT SURGERY Left    bone shaved off ; 2 surgeries (recurred)   ORIF PATELLA Right 09/08/2022   Procedure: OPEN REDUCTION INTERNAL FIXATION (ORIF) PATELLA;  Surgeon: London Sheer, MD;  Location: [facility];  Service: Orthopedics;  Laterality: Right;   Patient Active Problem List   Diagnosis Date Noted   Patellar fracture 09/08/2022   Unspecified fracture of right patella, initial encounter for closed fracture 09/07/2022   Osteopenia 02/17/2018   Family history of heart disease in female family member before age 44 05/31/2011   Arthritis 05/31/2011   Hyperlipidemia with target LDL less than 100 05/31/2011   Hypertension 05/31/2011   Allergic rhinitis due to pollen 05/31/2011   History of colonic polyps 05/31/2011    PCP: Sharlot Gowda, MD  REFERRING PROVIDER: Willia Craze, MD  REFERRING DIAG: Closed displaced comminuted fracture of right patella with routine healing, subsequent encounter, partial patellectomy.  THERAPY DIAG:  Displaced comminuted fracture of right patella, initial encounter for closed fracture  Stiffness of right knee, not elsewhere classified  Difficulty in walking, not elsewhere classified  Strain  of right quadriceps muscle, fascia and tendon, subsequent encounter  Rationale for Evaluation and Treatment: Rehabilitation  ONSET DATE: 09/08/22  SUBJECTIVE:   SUBJECTIVE STATEMENT: "Im doing good"  PERTINENT HISTORY: Fell at home on corner of air mattress at [employer], landed on R knee fractured R patella, underwent surgery to debride and repair.  Has undergone home health PT, now referred to outpatient PT to continue rehab PAIN:  Are you having pain? Yes: NPRS scale: 0/10 Pain location: R ant knee Pain description: occasional twinges Aggravating factors: certain movements Relieving factors: resting  PRECAUTIONS: Other: from MD referral; Work on Right knee active range of motion and quad strengtheningWork on Right knee active range of motion and quad strengthening  WEIGHT BEARING RESTRICTIONS: No  FALLS:  Has patient fallen in last 6 months? Yes. Number of falls 1  LIVING ENVIRONMENT: Lives with: lives with their family and lives with their spouse Lives in: House/apartment Stairs: Yes: Internal: 11 steps; on right going up Has following equipment at home: Single point cane and Walker - 2 wheeled  OCCUPATION: retired  PLOF: Independent  PATIENT GOALS: return to I lifestyle  NEXT MD VISIT: December 22 2022  OBJECTIVE:   DIAGNOSTIC FINDINGS: na  PATIENT SURVEYS:  FOTO 97  COGNITION: Overall cognitive status: Within functional limits for tasks assessed     SENSATION: WFL  EDEMA:  None noted POSTURE: weight shift left  PALPATION: Thickened R quads,  distally especially at patellar attachments, patella with good multidirectional mobility  LOWER EXTREMITY ROM:all wnl except R knee flexion in sitting 79, flexion supine to 70 Extension in supine -7 01/03/23:  R knee flexion in sitting 84, supine to 80  LOWER EXTREMITY MMT: R quads with 20 degree lag for LAQ R SLR with 10 degree lag  R hamstring 4- Hip extension, abduction wnl R plantarflexors wnl      FUNCTIONAL TESTS:  Functional gait assessment: 11    GAIT: Distance walked: within department Assistive device utilized: Single point cane Level of assistance: SBA Comments: using knee brace which was sitting on top of R foot and hindering, circumduction R LE noted   TODAY'S TREATMENT:                                                                                                                              DATE:  01/05/23 NuStep L5 x 6 min R knee PROM with end range holds Patella mobs RLE quad sets x10  S2S 2x10 Leg Press 20lb 2x10 Step ups 4in & 6in x5 each RLE LAQ 2lb 2x10 Seated quad stretch   01/03/23: -Nustep level 5, 6 min, Ue's and LE's -Seated R knee flexion stretch -Supine for patellar mobs, followed by assisted knee to chest, rocking mid range, contract/ relax for R knee flexion stretch, 5 sec holds, 10x -Supine SLR R, cues to engage distal quads and avoid extensor lag, initially R extensor lag greater than 10 degrees, improved with repeated tactile cues R post knee to encourage full quad set prior to lift -seated long arc quads, 3 #, therapist assist for terminal knee ext, then eccentric lowering  mass practice -gait 100' emphasis on elongated L step length  -ll bars for lateral heel taps L, needed much verbal, tactile cues to isolate R knee flex/terminal extension, needed B UE support mass practice -ll bars for L towel slides, f/b, side/side, and cw/ccw needed B UE support, mass practice  12/29/22 NuStep L5 x 6 min  Resisted gait 4 way x 3 , 20lb  Long arc quads 2# 10 reps x 2 Seated AAROM R knee flexion S2S 2x10 Stair training 4 in 2 rails working on alternating pattern. Hamstring curls red 2x10 Heel raises 2x10   12/27/22: Gait training, no device or brace. Emphasized elongated stride L LE and rolling off toe R Lunges on 6" step with B UE support for AAROM Supine for patellar mobs,  Supine AAROM R knee flexion 75 degrees Prone TKE's 5 sec holds 15  x Supine R SLR 10x,measured R knee extension -3 today Nustep level 3, 5 min, Ue's and LE"s Long arc quads 2# 10 reps x 2 Seated AAROM R knee flexion, with contract relax, measured flex 82 in sitting Stanidng TKE's with ball behind R knee 5 sec holds,10 reps   12/24/22 R knee PROM Flex & Ext RLE quad sets x5 LAQ RLE 2x10 RLE HS curls 2x10 red  NuStep L 3 x 4 min  Standing march 2x10 SAQ RLE 1lb 2x10 RLE SLR x10, x5 R knee PROM Flex & Ext   PATIENT EDUCATION:  Education details: POC, goals Person educated: Patient and Spouse Education method: Explanation, Demonstration, Tactile cues, Verbal cues, and Handouts Education comprehension: verbalized understanding, returned demonstration, verbal cues required, tactile cues required, and needs further education  HOME EXERCISE PROGRAM: Access Code: [email or web address] URL: [email or web address] Date: 01/03/2023 Prepared by: Linton Rump Speaks  Exercises - Supine Heel Slide with Strap  - 1 x daily - 7 x weekly - 3 sets - 10 reps - Supine Straight Leg Raises  - 1 x daily - 7 x weekly - 3 sets - 10 reps - Seated Long Arc Quad  - 1 x daily - 7 x weekly - 3 sets - 10 reps - Sit to Stand  - 1 x daily - 7 x weekly - 3 sets - 10 reps - Standing Heel Raise with Support  - 1 x daily - 7 x weekly - 3 sets - 10 reps - Side Step Down with Unilateral Counter Support  - 1 x daily - 7 x weekly - 3 sets - 10 reps  - Lower Quarter Reach Combination  - 1 x daily - 7 x weekly - 3 sets - 10 reps Access Code: [email or web address] URL: [email or web address] Date: 12/29/2022 Prepared by: Debroah Baller  Exercises - Supine Heel Slide with Strap  - 1 x daily - 7 x weekly - 3 sets - 10 reps - Supine Straight Leg Raises  - 1 x daily - 7 x weekly - 3 sets - 10 reps - Seated Long Arc Quad  - 1 x daily - 7 x weekly - 3 sets - 10 reps - Sit to Stand  - 1 x daily - 7 x weekly - 3 sets - 10 reps - Standing Heel Raise with Support  - 1 x daily - 7 x weekly - 3  sets - 10 reps ASSESSMENT:  CLINICAL IMPRESSION: Patient is a 77 y.o. female who was seen today for physical therapy treatment for rehabilitation of function for R LE after a traumatic fracture R patella 3 1/2 months ago.  Cue to bend RLE with gait. Added more functional intervention with minium compensation, R knee flexion remains limited overall, with end rage pain during PROM.  Continues to benefit from skilled PT .  OBJECTIVE IMPAIRMENTS: Abnormal gait, decreased activity tolerance, decreased endurance, decreased mobility, difficulty walking, decreased ROM, decreased strength, increased fascial restrictions, improper body mechanics, and pain.   ACTIVITY LIMITATIONS: carrying, lifting, bending, sitting, squatting, and locomotion level  PARTICIPATION LIMITATIONS: meal prep, cleaning, laundry, driving, shopping, community activity, and yard work  PERSONAL FACTORS: Age, Time since onset of injury/illness/exacerbation, and 1 comorbidity: HTN  are also affecting patient's functional outcome.   REHAB POTENTIAL: Good  CLINICAL DECISION MAKING: Stable/uncomplicated  EVALUATION COMPLEXITY: Low   GOALS: Goals reviewed with patient? Yes  SHORT TERM GOALS: Target date: 12/29/22 I HEP Baseline:established today Goal status: IN PROGRESS  2.  Improve R knee ROM to 0- 90 Baseline: -6 to 80 Goal status: IN PROGRESS 01/03/23:  flexion to 84 today  LONG TERM GOALS: Target date: 03/09/23  Improve FGA from 11 to 28 Baseline: 11 Goal status: INITIAL  2.  Foto 61 Baseline: 54 Goal status: INITIAL  3.  R quads strength 4+/5 for I transfers, gait efficiency, stairs, uneven terrain Baseline: 3-/5 Goal status: INITIAL    PLAN:  PT FREQUENCY: 2x/week  PT DURATION: 12 weeks  PLANNED INTERVENTIONS: Therapeutic exercises, Therapeutic activity, Neuromuscular re-education, Balance training, Gait training, Patient/Family education, Self Care, and Joint mobilization  PLAN FOR NEXT SESSION:  progress with gentle stretching R LE, continue to address R LE strengthening open and closed chain   Grayce Sessions, PTA 01/05/2023, 10:57 AM

## 2023-01-06 ENCOUNTER — Ambulatory Visit: Payer: Medicare PPO | Admitting: Orthopedic Surgery

## 2023-01-06 ENCOUNTER — Other Ambulatory Visit (INDEPENDENT_AMBULATORY_CARE_PROVIDER_SITE_OTHER): Payer: Medicare PPO

## 2023-01-06 DIAGNOSIS — S82041D Displaced comminuted fracture of right patella, subsequent encounter for closed fracture with routine healing: Secondary | ICD-10-CM

## 2023-01-06 NOTE — Progress Notes (Signed)
Orthopedic Surgery Progress Note     Assessment: Patient is a 77 y.o. female with right patella fracture status post partial patellectomy  Surgery date: 09/08/2022 (~4 months post-op)     Plan: -Operative plans complete -Pain control: OTC medications as needed -Weight-bear as tolerated, no brace -Return to full activity as tolerated -Continue to work with PT. Encouraged her to do daily range of motion exercises on her knee. Demonstrated several exercises in the office -Return to clinic in 3 weeks, XRs at next visit: none   ___________________________________________________________________________   Subjective: Not having any pain in her knee. Has been getting back to activity. Is walking around her cul-de-sac everyday. Ambulating without assistive devices. Feels she has been making improvements in her range of motion with therapy. Denies paresthesias and numbness.    Physical Exam:   General: no acute distress, appears stated age Neurologic: alert, answering questions appropriately, following commands Respiratory: unlabored breathing on room air, symmetric chest rise Psychiatric: appropriate affect, normal cadence to speech   MSK:    -Right lower extremity             Incisions appear well healed with no evidence of infection             Can actively extend knee against gravity with 5 degree extensor lag             Passive knee flexion to 85 degrees, actively can get to about 80 degrees EHL/TA/GSC intact Plantarflexes and dorsiflexes toes Sensation intact to light touch in sural, saphenous, tibial, deep peroneal, and superficial peroneal nerve distributions Foot warm and well perfused   Imaging:  XR of the right knee taken on 01/06/2023 was independently reviewed and interpreted, showing healing transverse patella fracture. Fracture line not as distinct. No new fractures seen.    Patient name: Danielle Yoder Patient MRN: [id number] Date: 01/06/23

## 2023-01-10 ENCOUNTER — Ambulatory Visit: Payer: Medicare PPO

## 2023-01-10 ENCOUNTER — Other Ambulatory Visit: Payer: Self-pay

## 2023-01-10 DIAGNOSIS — S76111D Strain of right quadriceps muscle, fascia and tendon, subsequent encounter: Secondary | ICD-10-CM

## 2023-01-10 DIAGNOSIS — S82041A Displaced comminuted fracture of right patella, initial encounter for closed fracture: Secondary | ICD-10-CM

## 2023-01-10 DIAGNOSIS — R262 Difficulty in walking, not elsewhere classified: Secondary | ICD-10-CM | POA: Diagnosis not present

## 2023-01-10 DIAGNOSIS — M25661 Stiffness of right knee, not elsewhere classified: Secondary | ICD-10-CM

## 2023-01-10 DIAGNOSIS — S82041D Displaced comminuted fracture of right patella, subsequent encounter for closed fracture with routine healing: Secondary | ICD-10-CM | POA: Diagnosis not present

## 2023-01-10 NOTE — Therapy (Signed)
OUTPATIENT PHYSICAL THERAPY LOWER EXTREMITY TREATMENT   Patient Name: Danielle Yoder MRN: [id number] DOB:[birth year], 77 y.o., female Today's Date: 01/10/2023  END OF SESSION:  PT End of Session - 01/10/23 1258     Visit Number 7    Date for PT Re-Evaluation 03/09/23    PT Start Time 1259    PT Stop Time 1345    PT Time Calculation (min) 46 min    Activity Tolerance Patient tolerated treatment well    Behavior During Therapy WFL for tasks assessed/performed               Past Medical History:  Diagnosis Date   Allergy    RHINITIS   Arthritis    Colonic polyp    Dyslipidemia    Hemorrhoids    Hypertension    Past Surgical History:  Procedure Laterality Date   FOOT SURGERY Right    bone shaved off; 2 surgeries (recurred 8 yrs later)   FOOT SURGERY Left    bone shaved off ; 2 surgeries (recurred)   ORIF PATELLA Right 09/08/2022   Procedure: OPEN REDUCTION INTERNAL FIXATION (ORIF) PATELLA;  Surgeon: London Sheer, MD;  Location: [facility];  Service: Orthopedics;  Laterality: Right;   Patient Active Problem List   Diagnosis Date Noted   Patellar fracture 09/08/2022   Unspecified fracture of right patella, initial encounter for closed fracture 09/07/2022   Osteopenia 02/17/2018   Family history of heart disease in female family member before age 43 05/31/2011   Arthritis 05/31/2011   Hyperlipidemia with target LDL less than 100 05/31/2011   Hypertension 05/31/2011   Allergic rhinitis due to pollen 05/31/2011   History of colonic polyps 05/31/2011    PCP: Sharlot Gowda, MD  REFERRING PROVIDER: Willia Craze, MD  REFERRING DIAG: Closed displaced comminuted fracture of right patella with routine healing, subsequent encounter, partial patellectomy.  THERAPY DIAG:  Displaced comminuted fracture of right patella, initial encounter for closed fracture  Stiffness of right knee, not elsewhere classified  Difficulty in walking, not elsewhere classified  Strain  of right quadriceps muscle, fascia and tendon, subsequent encounter  Rationale for Evaluation and Treatment: Rehabilitation  ONSET DATE: 09/08/22  SUBJECTIVE:   SUBJECTIVE STATEMENT: Feeling good, driving myself, trying to work on my gait.  PERTINENT HISTORY: Larey Seat at home on corner of air mattress at [employer], landed on R knee fractured R patella, underwent surgery to debride and repair.  Has undergone home health PT, now referred to outpatient PT to continue rehab PAIN:  Are you having pain? Yes: NPRS scale: 0/10 Pain location: R ant knee Pain description: occasional twinges Aggravating factors: certain movements Relieving factors: resting  PRECAUTIONS: Other: from MD referral; Work on Right knee active range of motion and quad strengtheningWork on Right knee active range of motion and quad strengthening  WEIGHT BEARING RESTRICTIONS: No  FALLS:  Has patient fallen in last 6 months? Yes. Number of falls 1  LIVING ENVIRONMENT: Lives with: lives with their family and lives with their spouse Lives in: House/apartment Stairs: Yes: Internal: 11 steps; on right going up Has following equipment at home: Single point cane and Walker - 2 wheeled  OCCUPATION: retired  PLOF: Independent  PATIENT GOALS: return to I lifestyle  NEXT MD VISIT: December 22 2022  OBJECTIVE:   DIAGNOSTIC FINDINGS: na  PATIENT SURVEYS:  FOTO 49  COGNITION: Overall cognitive status: Within functional limits for tasks assessed     SENSATION: WFL  EDEMA:  None noted POSTURE: weight  shift left  PALPATION: Thickened R quads, distally especially at patellar attachments, patella with good multidirectional mobility  LOWER EXTREMITY ROM:all wnl except R knee flexion in sitting 79, flexion supine to 70 Extension in supine -7 01/03/23:  R knee flexion in sitting 84, supine to 80 01/10/23: R knee flexion 88  LOWER EXTREMITY MMT: R quads with 20 degree lag for LAQ R SLR with 10 degree lag  R  hamstring 4- Hip extension, abduction wnl R plantarflexors wnl     FUNCTIONAL TESTS:  Functional gait assessment: 11    GAIT: Distance walked: within department Assistive device utilized: Single point cane Level of assistance: SBA Comments: using knee brace which was sitting on top of R foot and hindering, circumduction R LE noted   TODAY'S TREATMENT:                                                                                                                              DATE:  01/10/23: instructed and progressed the patient with the following exercises designed to improve her R Le function.   Nustep UE's and LE's level 4, 6 min Supine for R hip flexor stretch, and knee flexion stretch with contract/relax into knee flexion Supine for therapy gun, cross friction massage R quads, TFL musculature .  Seated r knee flexion stretch to 88 degrees Gait outdoors for 6 min, on asphalt, emphasis on elongating stride length Standing for static balancing on rocker board, 10 sec holds, then side to side rocking Centered R foot on airex, stepping over and back with L LE, in ll bars, mass practice, for terminal stability R LE/quads B knee flexion 25#, 10 x 2 sets Seated long arc quads R 3#, 10 x 2  Nustep 01/05/23 NuStep L5 x 6 min R knee PROM with end range holds Patella mobs RLE quad sets x10  S2S 2x10 Leg Press 20lb 2x10 Step ups 4in & 6in x5 each RLE LAQ 2lb 2x10 Seated quad stretch   01/03/23: -Nustep level 5, 6 min, Ue's and LE's -Seated R knee flexion stretch -Supine for patellar mobs, followed by assisted knee to chest, rocking mid range, contract/ relax for R knee flexion stretch, 5 sec holds, 10x -Supine SLR R, cues to engage distal quads and avoid extensor lag, initially R extensor lag greater than 10 degrees, improved with repeated tactile cues R post knee to encourage full quad set prior to lift -seated long arc quads, 3 #, therapist assist for terminal knee ext, then  eccentric lowering  mass practice -gait 100' emphasis on elongated L step length  -ll bars for lateral heel taps L, needed much verbal, tactile cues to isolate R knee flex/terminal extension, needed B UE support mass practice -ll bars for L towel slides, f/b, side/side, and cw/ccw needed B UE support, mass practice  12/29/22 NuStep L5 x 6 min  Resisted gait 4 way x 3 , 20lb  Long arc quads 2# 10 reps x  2 Seated AAROM R knee flexion S2S 2x10 Stair training 4 in 2 rails working on alternating pattern. Hamstring curls red 2x10 Heel raises 2x10   12/27/22: Gait training, no device or brace. Emphasized elongated stride L LE and rolling off toe R Lunges on 6" step with B UE support for AAROM Supine for patellar mobs,  Supine AAROM R knee flexion 75 degrees Prone TKE's 5 sec holds 15 x Supine R SLR 10x,measured R knee extension -3 today Nustep level 3, 5 min, Ue's and LE"s Long arc quads 2# 10 reps x 2 Seated AAROM R knee flexion, with contract relax, measured flex 82 in sitting Stanidng TKE's with ball behind R knee 5 sec holds,10 reps   12/24/22 R knee PROM Flex & Ext RLE quad sets x5 LAQ RLE 2x10 RLE HS curls 2x10 red  NuStep L 3 x 4 min  Standing march 2x10 SAQ RLE 1lb 2x10 RLE SLR x10, x5 R knee PROM Flex & Ext   PATIENT EDUCATION:  Education details: POC, goals Person educated: Patient and Spouse Education method: Explanation, Demonstration, Tactile cues, Verbal cues, and Handouts Education comprehension: verbalized understanding, returned demonstration, verbal cues required, tactile cues required, and needs further education  HOME EXERCISE PROGRAM: Access Code: [email or web address] URL: [email or web address] Date: 01/03/2023 Prepared by: Linton Rump Damaria Stofko  Exercises - Supine Heel Slide with Strap  - 1 x daily - 7 x weekly - 3 sets - 10 reps - Supine Straight Leg Raises  - 1 x daily - 7 x weekly - 3 sets - 10 reps - Seated Long Arc Quad  - 1 x daily - 7 x weekly - 3  sets - 10 reps - Sit to Stand  - 1 x daily - 7 x weekly - 3 sets - 10 reps - Standing Heel Raise with Support  - 1 x daily - 7 x weekly - 3 sets - 10 reps - Side Step Down with Unilateral Counter Support  - 1 x daily - 7 x weekly - 3 sets - 10 reps  - Lower Quarter Reach Combination  - 1 x daily - 7 x weekly - 3 sets - 10 reps Access Code: [email or web address] URL: [email or web address] Date: 12/29/2022 Prepared by: Debroah Baller  Exercises - Supine Heel Slide with Strap  - 1 x daily - 7 x weekly - 3 sets - 10 reps - Supine Straight Leg Raises  - 1 x daily - 7 x weekly - 3 sets - 10 reps - Seated Long Arc Quad  - 1 x daily - 7 x weekly - 3 sets - 10 reps - Sit to Stand  - 1 x daily - 7 x weekly - 3 sets - 10 reps - Standing Heel Raise with Support  - 1 x daily - 7 x weekly - 3 sets - 10 reps ASSESSMENT:  CLINICAL IMPRESSION: Patient is a 77 y.o. female who was seen today for physical therapy treatment for rehabilitation of function for R LE after a traumatic fracture R patella 3 1/2 months ago.  Focused today on further stretching her R knee into flexion and additional strengthening.  Her gait is getting more fluid now.     Continues to benefit from skilled PT tp recover.  OBJECTIVE IMPAIRMENTS: Abnormal gait, decreased activity tolerance, decreased endurance, decreased mobility, difficulty walking, decreased ROM, decreased strength, increased fascial restrictions, improper body mechanics, and pain.   ACTIVITY LIMITATIONS: carrying, lifting, bending, sitting, squatting, and locomotion level  PARTICIPATION LIMITATIONS: meal prep, cleaning, laundry, driving,  shopping, community activity, and yard work  PERSONAL FACTORS: Age, Time since onset of injury/illness/exacerbation, and 1 comorbidity: HTN  are also affecting patient's functional outcome.   REHAB POTENTIAL: Good  CLINICAL DECISION MAKING: Stable/uncomplicated  EVALUATION COMPLEXITY: Low   GOALS: Goals reviewed with  patient? Yes  SHORT TERM GOALS: Target date: 12/29/22 I HEP Baseline:established today Goal status: IN PROGRESS  2.  Improve R knee ROM to 0- 90 Baseline: -6 to 80 Goal status: IN PROGRESS 01/03/23:  flexion to 84 today 01/10/23:  flexion to 88 today  LONG TERM GOALS: Target date: 03/09/23  Improve FGA from 11 to 28 Baseline: 11 Goal status: INITIAL  2.  Foto 61 Baseline: 54 Goal status: INITIAL  3.  R quads strength 4+/5 for I transfers, gait efficiency, stairs, uneven terrain Baseline: 3-/5 Goal status: IN PROGRESS    PLAN:  PT FREQUENCY: 2x/week  PT DURATION: 12 weeks  PLANNED INTERVENTIONS: Therapeutic exercises, Therapeutic activity, Neuromuscular re-education, Balance training, Gait training, Patient/Family education, Self Care, and Joint mobilization  PLAN FOR NEXT SESSION: progress with gentle stretching R LE, continue to address R LE strengthening open and closed chain   Kinzlie Harney L Zyan Mirkin, PT 01/10/2023, 3:39 PM

## 2023-01-12 ENCOUNTER — Ambulatory Visit: Payer: Medicare PPO | Attending: Orthopedic Surgery | Admitting: Physical Therapy

## 2023-01-12 ENCOUNTER — Encounter: Payer: Self-pay | Admitting: Physical Therapy

## 2023-01-12 DIAGNOSIS — S76111D Strain of right quadriceps muscle, fascia and tendon, subsequent encounter: Secondary | ICD-10-CM | POA: Diagnosis not present

## 2023-01-12 DIAGNOSIS — S82041A Displaced comminuted fracture of right patella, initial encounter for closed fracture: Secondary | ICD-10-CM | POA: Diagnosis not present

## 2023-01-12 DIAGNOSIS — R262 Difficulty in walking, not elsewhere classified: Secondary | ICD-10-CM | POA: Insufficient documentation

## 2023-01-12 DIAGNOSIS — M25661 Stiffness of right knee, not elsewhere classified: Secondary | ICD-10-CM | POA: Diagnosis not present

## 2023-01-12 NOTE — Therapy (Signed)
OUTPATIENT PHYSICAL THERAPY LOWER EXTREMITY TREATMENT   Patient Name: Danielle Yoder MRN: [id number] DOB:05-13-1946, 77 y.o., female Today's Date: 01/12/2023  END OF SESSION:  PT End of Session - 01/12/23 1148     Visit Number 8    Date for PT Re-Evaluation 03/09/23    PT Start Time 1148    PT Stop Time 1230    PT Time Calculation (min) 42 min    Activity Tolerance Patient tolerated treatment well    Behavior During Therapy WFL for tasks assessed/performed               Past Medical History:  Diagnosis Date   Allergy    RHINITIS   Arthritis    Colonic polyp    Dyslipidemia    Hemorrhoids    Hypertension    Past Surgical History:  Procedure Laterality Date   FOOT SURGERY Right    bone shaved off; 2 surgeries (recurred 8 yrs later)   FOOT SURGERY Left    bone shaved off ; 2 surgeries (recurred)   ORIF PATELLA Right 09/08/2022   Procedure: OPEN REDUCTION INTERNAL FIXATION (ORIF) PATELLA;  Surgeon: London Sheer, MD;  Location: [facility];  Service: Orthopedics;  Laterality: Right;   Patient Active Problem List   Diagnosis Date Noted   Patellar fracture 09/08/2022   Unspecified fracture of right patella, initial encounter for closed fracture 09/07/2022   Osteopenia 02/17/2018   Family history of heart disease in female family member before age 1 05/31/2011   Arthritis 05/31/2011   Hyperlipidemia with target LDL less than 100 05/31/2011   Hypertension 05/31/2011   Allergic rhinitis due to pollen 05/31/2011   History of colonic polyps 05/31/2011    PCP: Sharlot Gowda, MD  REFERRING PROVIDER: Willia Craze, MD  REFERRING DIAG: Closed displaced comminuted fracture of right patella with routine healing, subsequent encounter, partial patellectomy.  THERAPY DIAG:  Displaced comminuted fracture of right patella, initial encounter for closed fracture  Stiffness of right knee, not elsewhere classified  Difficulty in walking, not elsewhere  classified  Rationale for Evaluation and Treatment: Rehabilitation  ONSET DATE: 09/08/22  SUBJECTIVE:   SUBJECTIVE STATEMENT: "It tight in the morning when I wake up" "Im doing ok"  PERTINENT HISTORY: Fell at home on corner of air mattress at [employer], landed on R knee fractured R patella, underwent surgery to debride and repair.  Has undergone home health PT, now referred to outpatient PT to continue rehab PAIN:  Are you having pain? Yes: NPRS scale: 0/10 Pain location: R ant knee Pain description: occasional twinges Aggravating factors: certain movements Relieving factors: resting  PRECAUTIONS: Other: from MD referral; Work on Right knee active range of motion and quad strengtheningWork on Right knee active range of motion and quad strengthening  WEIGHT BEARING RESTRICTIONS: No  FALLS:  Has patient fallen in last 6 months? Yes. Number of falls 1  LIVING ENVIRONMENT: Lives with: lives with their family and lives with their spouse Lives in: House/apartment Stairs: Yes: Internal: 11 steps; on right going up Has following equipment at home: Single point cane and Walker - 2 wheeled  OCCUPATION: retired  PLOF: Independent  PATIENT GOALS: return to I lifestyle  NEXT MD VISIT: December 22 2022  OBJECTIVE:   DIAGNOSTIC FINDINGS: na  PATIENT SURVEYS:  FOTO 52  COGNITION: Overall cognitive status: Within functional limits for tasks assessed     SENSATION: WFL  EDEMA:  None noted POSTURE: weight shift left  PALPATION: Thickened R quads, distally especially  at patellar attachments, patella with good multidirectional mobility  LOWER EXTREMITY ROM:all wnl except R knee flexion in sitting 79, flexion supine to 70 Extension in supine -7 01/03/23:  R knee flexion in sitting 84, supine to 80 01/10/23: R knee flexion 88  LOWER EXTREMITY MMT: R quads with 20 degree lag for LAQ R SLR with 10 degree lag  R hamstring 4- Hip extension, abduction wnl R plantarflexors wnl      FUNCTIONAL TESTS:  Functional gait assessment: 11    GAIT: Distance walked: within department Assistive device utilized: Single point cane Level of assistance: SBA Comments: using knee brace which was sitting on top of R foot and hindering, circumduction R LE noted   TODAY'S TREATMENT:                                                                                                                              DATE:  01/12/23 NuStep L4 x 6 min R knee PROM with end range holds Patella mobs S2S holding red ball 2x10 Leg press RLE TKE 20lb 2x10 Resisted gait 30lb all directions x4 each Seated long arc quads R 3#, 10 x 2   01/10/23: instructed and progressed the patient with the following exercises designed to improve her R Le function.   Nustep UE's and LE's level 4, 6 min Supine for R hip flexor stretch, and knee flexion stretch with contract/relax into knee flexion Supine for therapy gun, cross friction massage R quads, TFL musculature .  Seated r knee flexion stretch to 88 degrees Gait outdoors for 6 min, on asphalt, emphasis on elongating stride length Standing for static balancing on rocker board, 10 sec holds, then side to side rocking Centered R foot on airex, stepping over and back with L LE, in ll bars, mass practice, for terminal stability R LE/quads B knee flexion 25#, 10 x 2 sets Seated long arc quads R 3#, 10 x 2  Nustep 01/05/23 NuStep L5 x 6 min R knee PROM with end range holds Patella mobs RLE quad sets x10  S2S 2x10 Leg Press 20lb 2x10 Step ups 4in & 6in x5 each RLE LAQ 2lb 2x10 Seated quad stretch   01/03/23: -Nustep level 5, 6 min, Ue's and LE's -Seated R knee flexion stretch -Supine for patellar mobs, followed by assisted knee to chest, rocking mid range, contract/ relax for R knee flexion stretch, 5 sec holds, 10x -Supine SLR R, cues to engage distal quads and avoid extensor lag, initially R extensor lag greater than 10 degrees, improved with repeated  tactile cues R post knee to encourage full quad set prior to lift -seated long arc quads, 3 #, therapist assist for terminal knee ext, then eccentric lowering  mass practice -gait 100' emphasis on elongated L step length  -ll bars for lateral heel taps L, needed much verbal, tactile cues to isolate R knee flex/terminal extension, needed B UE support mass practice -ll bars for L towel  slides, f/b, side/side, and cw/ccw needed B UE support, mass practice  12/29/22 NuStep L5 x 6 min  Resisted gait 4 way x 3 , 20lb  Long arc quads 2# 10 reps x 2 Seated AAROM R knee flexion S2S 2x10 Stair training 4 in 2 rails working on alternating pattern. Hamstring curls red 2x10 Heel raises 2x10   12/27/22: Gait training, no device or brace. Emphasized elongated stride L LE and rolling off toe R Lunges on 6" step with B UE support for AAROM Supine for patellar mobs,  Supine AAROM R knee flexion 75 degrees Prone TKE's 5 sec holds 15 x Supine R SLR 10x,measured R knee extension -3 today Nustep level 3, 5 min, Ue's and LE"s Long arc quads 2# 10 reps x 2 Seated AAROM R knee flexion, with contract relax, measured flex 82 in sitting Stanidng TKE's with ball behind R knee 5 sec holds,10 reps   12/24/22 R knee PROM Flex & Ext RLE quad sets x5 LAQ RLE 2x10 RLE HS curls 2x10 red  NuStep L 3 x 4 min  Standing march 2x10 SAQ RLE 1lb 2x10 RLE SLR x10, x5 R knee PROM Flex & Ext   PATIENT EDUCATION:  Education details: POC, goals Person educated: Patient and Spouse Education method: Explanation, Demonstration, Tactile cues, Verbal cues, and Handouts Education comprehension: verbalized understanding, returned demonstration, verbal cues required, tactile cues required, and needs further education  HOME EXERCISE PROGRAM: Access Code: [email or web address] URL: [email or web address] Date: 01/03/2023 Prepared by: Linton Rump Speaks  Exercises - Supine Heel Slide with Strap  - 1 x daily - 7 x weekly - 3 sets -  10 reps - Supine Straight Leg Raises  - 1 x daily - 7 x weekly - 3 sets - 10 reps - Seated Long Arc Quad  - 1 x daily - 7 x weekly - 3 sets - 10 reps - Sit to Stand  - 1 x daily - 7 x weekly - 3 sets - 10 reps - Standing Heel Raise with Support  - 1 x daily - 7 x weekly - 3 sets - 10 reps - Side Step Down with Unilateral Counter Support  - 1 x daily - 7 x weekly - 3 sets - 10 reps  - Lower Quarter Reach Combination  - 1 x daily - 7 x weekly - 3 sets - 10 reps Access Code: [email or web address] URL: [email or web address] Date: 12/29/2022 Prepared by: Debroah Baller  Exercises - Supine Heel Slide with Strap  - 1 x daily - 7 x weekly - 3 sets - 10 reps - Supine Straight Leg Raises  - 1 x daily - 7 x weekly - 3 sets - 10 reps - Seated Long Arc Quad  - 1 x daily - 7 x weekly - 3 sets - 10 reps - Sit to Stand  - 1 x daily - 7 x weekly - 3 sets - 10 reps - Standing Heel Raise with Support  - 1 x daily - 7 x weekly - 3 sets - 10 reps ASSESSMENT:  CLINICAL IMPRESSION: Patient is a 77 y.o. female who was seen today for physical therapy treatment for rehabilitation of function for R LE after a traumatic fracture R patella 4 1/2 months ago.  Focused today on further stretching her R knee into flexion and additional strengthening.  Pt did well with SL on leg press. Cue for equal step length needed wth resisted gait. Cues at times needed with gait to allow RLE to bend  Continues to benefit from skilled PT tp recover.  OBJECTIVE IMPAIRMENTS: Abnormal gait, decreased activity tolerance, decreased endurance, decreased mobility, difficulty walking, decreased ROM, decreased strength, increased fascial restrictions, improper body mechanics, and pain.   ACTIVITY LIMITATIONS: carrying, lifting, bending, sitting, squatting, and locomotion level  PARTICIPATION LIMITATIONS: meal prep, cleaning, laundry, driving, shopping, community activity, and yard work  PERSONAL FACTORS: Age, Time since onset of  injury/illness/exacerbation, and 1 comorbidity: HTN  are also affecting patient's functional outcome.   REHAB POTENTIAL: Good  CLINICAL DECISION MAKING: Stable/uncomplicated  EVALUATION COMPLEXITY: Low   GOALS: Goals reviewed with patient? Yes  SHORT TERM GOALS: Target date: 12/29/22 I HEP Baseline:established today Goal status: IN PROGRESS  2.  Improve R knee ROM to 0- 90 Baseline: -6 to 80 Goal status: IN PROGRESS 01/03/23:  flexion to 84 today 01/10/23:  flexion to 88 today  LONG TERM GOALS: Target date: 03/09/23  Improve FGA from 11 to 28 Baseline: 11 Goal status: INITIAL  2.  Foto 61 Baseline: 54 Goal status: INITIAL  3.  R quads strength 4+/5 for I transfers, gait efficiency, stairs, uneven terrain Baseline: 3-/5 Goal status: IN PROGRESS    PLAN:  PT FREQUENCY: 2x/week  PT DURATION: 12 weeks  PLANNED INTERVENTIONS: Therapeutic exercises, Therapeutic activity, Neuromuscular re-education, Balance training, Gait training, Patient/Family education, Self Care, and Joint mobilization  PLAN FOR NEXT SESSION: progress with gentle stretching R LE, continue to address R LE strengthening open and closed chain   Grayce Sessions, PTA 01/12/2023, 11:48 AM

## 2023-01-17 ENCOUNTER — Encounter: Payer: Self-pay | Admitting: Physical Therapy

## 2023-01-17 ENCOUNTER — Ambulatory Visit: Payer: Medicare PPO | Admitting: Physical Therapy

## 2023-01-17 DIAGNOSIS — M25661 Stiffness of right knee, not elsewhere classified: Secondary | ICD-10-CM | POA: Diagnosis not present

## 2023-01-17 DIAGNOSIS — R262 Difficulty in walking, not elsewhere classified: Secondary | ICD-10-CM | POA: Diagnosis not present

## 2023-01-17 DIAGNOSIS — S76111D Strain of right quadriceps muscle, fascia and tendon, subsequent encounter: Secondary | ICD-10-CM

## 2023-01-17 DIAGNOSIS — S82041A Displaced comminuted fracture of right patella, initial encounter for closed fracture: Secondary | ICD-10-CM

## 2023-01-17 NOTE — Therapy (Signed)
OUTPATIENT PHYSICAL THERAPY LOWER EXTREMITY TREATMENT   Patient Name: Danielle Yoder MRN: [id number] DOB:August 26, 1946, 77 y.o., female Today's Date: 01/17/2023  END OF SESSION:  PT End of Session - 01/17/23 1300     Visit Number 9    Date for PT Re-Evaluation 03/09/23    PT Start Time 1300    PT Stop Time 1345    PT Time Calculation (min) 45 min    Activity Tolerance Patient tolerated treatment well    Behavior During Therapy WFL for tasks assessed/performed               Past Medical History:  Diagnosis Date   Allergy    RHINITIS   Arthritis    Colonic polyp    Dyslipidemia    Hemorrhoids    Hypertension    Past Surgical History:  Procedure Laterality Date   FOOT SURGERY Right    bone shaved off; 2 surgeries (recurred 8 yrs later)   FOOT SURGERY Left    bone shaved off ; 2 surgeries (recurred)   ORIF PATELLA Right 09/08/2022   Procedure: OPEN REDUCTION INTERNAL FIXATION (ORIF) PATELLA;  Surgeon: London Sheer, MD;  Location: [facility];  Service: Orthopedics;  Laterality: Right;   Patient Active Problem List   Diagnosis Date Noted   Patellar fracture 09/08/2022   Unspecified fracture of right patella, initial encounter for closed fracture 09/07/2022   Osteopenia 02/17/2018   Family history of heart disease in female family member before age 73 05/31/2011   Arthritis 05/31/2011   Hyperlipidemia with target LDL less than 100 05/31/2011   Hypertension 05/31/2011   Allergic rhinitis due to pollen 05/31/2011   History of colonic polyps 05/31/2011    PCP: Sharlot Gowda, MD  REFERRING PROVIDER: Willia Craze, MD  REFERRING DIAG: Closed displaced comminuted fracture of right patella with routine healing, subsequent encounter, partial patellectomy.  THERAPY DIAG:  Displaced comminuted fracture of right patella, initial encounter for closed fracture  Stiffness of right knee, not elsewhere classified  Difficulty in walking, not elsewhere classified  Strain  of right quadriceps muscle, fascia and tendon, subsequent encounter  Rationale for Evaluation and Treatment: Rehabilitation  ONSET DATE: 09/08/22  SUBJECTIVE:   SUBJECTIVE STATEMENT: "Im doing fine"  PERTINENT HISTORY: Fell at home on corner of air mattress at [employer], landed on R knee fractured R patella, underwent surgery to debride and repair.  Has undergone home health PT, now referred to outpatient PT to continue rehab PAIN:  Are you having pain? Yes: NPRS scale: 0/10 Pain location: R ant knee Pain description: occasional twinges Aggravating factors: certain movements Relieving factors: resting  PRECAUTIONS: Other: from MD referral; Work on Right knee active range of motion and quad strengtheningWork on Right knee active range of motion and quad strengthening  WEIGHT BEARING RESTRICTIONS: No  FALLS:  Has patient fallen in last 6 months? Yes. Number of falls 1  LIVING ENVIRONMENT: Lives with: lives with their family and lives with their spouse Lives in: House/apartment Stairs: Yes: Internal: 11 steps; on right going up Has following equipment at home: Single point cane and Walker - 2 wheeled  OCCUPATION: retired  PLOF: Independent  PATIENT GOALS: return to I lifestyle  NEXT MD VISIT: December 22 2022  OBJECTIVE:   DIAGNOSTIC FINDINGS: na  PATIENT SURVEYS:  FOTO 70  COGNITION: Overall cognitive status: Within functional limits for tasks assessed     SENSATION: WFL  EDEMA:  None noted POSTURE: weight shift left  PALPATION: Thickened R quads,  distally especially at patellar attachments, patella with good multidirectional mobility  LOWER EXTREMITY ROM:all wnl except R knee flexion in sitting 79, flexion supine to 70 Extension in supine -7 01/03/23:  R knee flexion in sitting 84, supine to 80 01/10/23: R knee flexion 88  LOWER EXTREMITY MMT: R quads with 20 degree lag for LAQ R SLR with 10 degree lag  R hamstring 4- Hip extension, abduction wnl R  plantarflexors wnl     FUNCTIONAL TESTS:  Functional gait assessment: 11    GAIT: Distance walked: within department Assistive device utilized: Single point cane Level of assistance: SBA Comments: using knee brace which was sitting on top of R foot and hindering, circumduction R LE noted   TODAY'S TREATMENT:                                                                                                                              DATE:  01/17/23 Gait one lap around the widest part of bac parking lot NuStep L4 x4 min LE only  R knee PROM with end range holds Patella mobs LAQ 3lb 2x10  S2S LE on airex 2x15  6in step ups RLE  6in lateral step ups  Resisted gait 30lb all directions x4 each   01/12/23 NuStep L4 x 6 min R knee PROM with end range holds Patella mobs S2S holding red ball 2x10 Leg press RLE TKE 20lb 2x10 Resisted gait 30lb all directions x4 each Seated long arc quads R 3#, 10 x 2   01/10/23: instructed and progressed the patient with the following exercises designed to improve her R Le function.   Nustep UE's and LE's level 4, 6 min Supine for R hip flexor stretch, and knee flexion stretch with contract/relax into knee flexion Supine for therapy gun, cross friction massage R quads, TFL musculature .  Seated r knee flexion stretch to 88 degrees Gait outdoors for 6 min, on asphalt, emphasis on elongating stride length Standing for static balancing on rocker board, 10 sec holds, then side to side rocking Centered R foot on airex, stepping over and back with L LE, in ll bars, mass practice, for terminal stability R LE/quads B knee flexion 25#, 10 x 2 sets Seated long arc quads R 3#, 10 x 2  Nustep 01/05/23 NuStep L5 x 6 min R knee PROM with end range holds Patella mobs RLE quad sets x10  S2S 2x10 Leg Press 20lb 2x10 Step ups 4in & 6in x5 each RLE LAQ 2lb 2x10 Seated quad stretch   01/03/23: -Nustep level 5, 6 min, Ue's and LE's -Seated R knee flexion  stretch -Supine for patellar mobs, followed by assisted knee to chest, rocking mid range, contract/ relax for R knee flexion stretch, 5 sec holds, 10x -Supine SLR R, cues to engage distal quads and avoid extensor lag, initially R extensor lag greater than 10 degrees, improved with repeated tactile cues R post knee to encourage full quad set  prior to lift -seated long arc quads, 3 #, therapist assist for terminal knee ext, then eccentric lowering  mass practice -gait 100' emphasis on elongated L step length  -ll bars for lateral heel taps L, needed much verbal, tactile cues to isolate R knee flex/terminal extension, needed B UE support mass practice -ll bars for L towel slides, f/b, side/side, and cw/ccw needed B UE support, mass practice  12/29/22 NuStep L5 x 6 min  Resisted gait 4 way x 3 , 20lb  Long arc quads 2# 10 reps x 2 Seated AAROM R knee flexion S2S 2x10 Stair training 4 in 2 rails working on alternating pattern. Hamstring curls red 2x10 Heel raises 2x10   12/27/22: Gait training, no device or brace. Emphasized elongated stride L LE and rolling off toe R Lunges on 6" step with B UE support for AAROM Supine for patellar mobs,  Supine AAROM R knee flexion 75 degrees Prone TKE's 5 sec holds 15 x Supine R SLR 10x,measured R knee extension -3 today Nustep level 3, 5 min, Ue's and LE"s Long arc quads 2# 10 reps x 2 Seated AAROM R knee flexion, with contract relax, measured flex 82 in sitting Stanidng TKE's with ball behind R knee 5 sec holds,10 reps   12/24/22 R knee PROM Flex & Ext RLE quad sets x5 LAQ RLE 2x10 RLE HS curls 2x10 red  NuStep L 3 x 4 min  Standing march 2x10 SAQ RLE 1lb 2x10 RLE SLR x10, x5 R knee PROM Flex & Ext   PATIENT EDUCATION:  Education details: POC, goals Person educated: Patient and Spouse Education method: Explanation, Demonstration, Tactile cues, Verbal cues, and Handouts Education comprehension: verbalized understanding, returned  demonstration, verbal cues required, tactile cues required, and needs further education  HOME EXERCISE PROGRAM: Access Code: [email or web address] URL: [email or web address] Date: 01/03/2023 Prepared by: Linton Rump Speaks  Exercises - Supine Heel Slide with Strap  - 1 x daily - 7 x weekly - 3 sets - 10 reps - Supine Straight Leg Raises  - 1 x daily - 7 x weekly - 3 sets - 10 reps - Seated Long Arc Quad  - 1 x daily - 7 x weekly - 3 sets - 10 reps - Sit to Stand  - 1 x daily - 7 x weekly - 3 sets - 10 reps - Standing Heel Raise with Support  - 1 x daily - 7 x weekly - 3 sets - 10 reps - Side Step Down with Unilateral Counter Support  - 1 x daily - 7 x weekly - 3 sets - 10 reps  - Lower Quarter Reach Combination  - 1 x daily - 7 x weekly - 3 sets - 10 reps Access Code: [email or web address] URL: [email or web address] Date: 12/29/2022 Prepared by: Debroah Baller  Exercises - Supine Heel Slide with Strap  - 1 x daily - 7 x weekly - 3 sets - 10 reps - Supine Straight Leg Raises  - 1 x daily - 7 x weekly - 3 sets - 10 reps - Seated Long Arc Quad  - 1 x daily - 7 x weekly - 3 sets - 10 reps - Sit to Stand  - 1 x daily - 7 x weekly - 3 sets - 10 reps - Standing Heel Raise with Support  - 1 x daily - 7 x weekly - 3 sets - 10 reps ASSESSMENT:  CLINICAL IMPRESSION: Patient is a 76 y.o. female who was seen today for physical therapy treatment for rehabilitation of  function for R LE after a traumatic fracture R patella 4 1/2 months ago.  Focused today on further stretching her R knee into flexion and additional strengthening.  Progressed with outdoor ambulation incourging pt to attempt faster gait speed , but with little compliances. . Cue for equal step length needed wth resisted gait. Cues at times needed with gait to allow RLE to bend     Continues to benefit from skilled PT to recover.  OBJECTIVE IMPAIRMENTS: Abnormal gait, decreased activity tolerance, decreased endurance, decreased mobility,  difficulty walking, decreased ROM, decreased strength, increased fascial restrictions, improper body mechanics, and pain.   ACTIVITY LIMITATIONS: carrying, lifting, bending, sitting, squatting, and locomotion level  PARTICIPATION LIMITATIONS: meal prep, cleaning, laundry, driving, shopping, community activity, and yard work  PERSONAL FACTORS: Age, Time since onset of injury/illness/exacerbation, and 1 comorbidity: HTN  are also affecting patient's functional outcome.   REHAB POTENTIAL: Good  CLINICAL DECISION MAKING: Stable/uncomplicated  EVALUATION COMPLEXITY: Low   GOALS: Goals reviewed with patient? Yes  SHORT TERM GOALS: Target date: 12/29/22 I HEP Baseline:established today Goal status: IN PROGRESS  2.  Improve R knee ROM to 0- 90 Baseline: -6 to 80 Goal status: IN PROGRESS 01/03/23:  flexion to 84 today 01/10/23:  flexion to 88 today  LONG TERM GOALS: Target date: 03/09/23  Improve FGA from 11 to 28 Baseline: 11 Goal status: INITIAL  2.  Foto 61 Baseline: 54 Goal status: INITIAL  3.  R quads strength 4+/5 for I transfers, gait efficiency, stairs, uneven terrain Baseline: 3-/5 Goal status: IN PROGRESS    PLAN:  PT FREQUENCY: 2x/week  PT DURATION: 12 weeks  PLANNED INTERVENTIONS: Therapeutic exercises, Therapeutic activity, Neuromuscular re-education, Balance training, Gait training, Patient/Family education, Self Care, and Joint mobilization  PLAN FOR NEXT SESSION: progress with gentle stretching R LE, continue to address R LE strengthening open and closed chain   Grayce Sessions, PTA 01/17/2023, 1:00 PM

## 2023-01-19 ENCOUNTER — Ambulatory Visit: Payer: Medicare PPO

## 2023-01-19 ENCOUNTER — Other Ambulatory Visit: Payer: Self-pay

## 2023-01-19 DIAGNOSIS — M25661 Stiffness of right knee, not elsewhere classified: Secondary | ICD-10-CM

## 2023-01-19 DIAGNOSIS — S76111D Strain of right quadriceps muscle, fascia and tendon, subsequent encounter: Secondary | ICD-10-CM | POA: Diagnosis not present

## 2023-01-19 DIAGNOSIS — S82041A Displaced comminuted fracture of right patella, initial encounter for closed fracture: Secondary | ICD-10-CM

## 2023-01-19 DIAGNOSIS — R262 Difficulty in walking, not elsewhere classified: Secondary | ICD-10-CM | POA: Diagnosis not present

## 2023-01-19 NOTE — Therapy (Signed)
OUTPATIENT PHYSICAL THERAPY LOWER EXTREMITY PROGRESS REPORT  Progress Note Reporting Period 12/15/22 to 01/19/23  See note below for Objective Data and Assessment of Progress/Goals.     Patient Name: Danielle Yoder MRN: [id number] DOB:01/28/1946, 77 y.o., female Today's Date: 01/19/2023  END OF SESSION:  PT End of Session - 01/19/23 1351     Visit Number 10    Date for PT Re-Evaluation 03/09/23    Progress Note Due on Visit 20    PT Start Time 1343    PT Stop Time 1430    PT Time Calculation (min) 47 min                Past Medical History:  Diagnosis Date   Allergy    RHINITIS   Arthritis    Colonic polyp    Dyslipidemia    Hemorrhoids    Hypertension    Past Surgical History:  Procedure Laterality Date   FOOT SURGERY Right    bone shaved off; 2 surgeries (recurred 8 yrs later)   FOOT SURGERY Left    bone shaved off ; 2 surgeries (recurred)   ORIF PATELLA Right 09/08/2022   Procedure: OPEN REDUCTION INTERNAL FIXATION (ORIF) PATELLA;  Surgeon: London Sheer, MD;  Location: [facility];  Service: Orthopedics;  Laterality: Right;   Patient Active Problem List   Diagnosis Date Noted   Patellar fracture 09/08/2022   Unspecified fracture of right patella, initial encounter for closed fracture 09/07/2022   Osteopenia 02/17/2018   Family history of heart disease in female family member before age 71 05/31/2011   Arthritis 05/31/2011   Hyperlipidemia with target LDL less than 100 05/31/2011   Hypertension 05/31/2011   Allergic rhinitis due to pollen 05/31/2011   History of colonic polyps 05/31/2011    PCP: Sharlot Gowda, MD  REFERRING PROVIDER: Willia Craze, MD  REFERRING DIAG: Closed displaced comminuted fracture of right patella with routine healing, subsequent encounter, partial patellectomy.  THERAPY DIAG:  Displaced comminuted fracture of right patella, initial encounter for closed fracture  Stiffness of right knee, not elsewhere  classified  Difficulty in walking, not elsewhere classified  Strain of right quadriceps muscle, fascia and tendon, subsequent encounter  Rationale for Evaluation and Treatment: Rehabilitation  ONSET DATE: 09/08/22  SUBJECTIVE:   SUBJECTIVE STATEMENT: No pain really, just every once in a while.  Achy/sore on days when I am more active  PERTINENT HISTORY: Fell at home on corner of air mattress at [employer], landed on R knee fractured R patella, underwent surgery to debride and repair.  Has undergone home health PT, now referred to outpatient PT to continue rehab PAIN:  Are you having pain? Yes: NPRS scale: 0/10 Pain location: R ant knee Pain description: occasional twinges Aggravating factors: certain movements Relieving factors: resting  PRECAUTIONS: Other: from MD referral; Work on Right knee active range of motion and quad strengtheningWork on Right knee active range of motion and quad strengthening  WEIGHT BEARING RESTRICTIONS: No  FALLS:  Has patient fallen in last 6 months? Yes. Number of falls 1  LIVING ENVIRONMENT: Lives with: lives with their family and lives with their spouse Lives in: House/apartment Stairs: Yes: Internal: 11 steps; on right going up Has following equipment at home: Single point cane and Walker - 2 wheeled  OCCUPATION: retired  PLOF: Independent  PATIENT GOALS: return to I lifestyle  NEXT MD VISIT: December 22 2022  OBJECTIVE:   DIAGNOSTIC FINDINGS: na  PATIENT SURVEYS:  FOTO 94  COGNITION: Overall  cognitive status: Within functional limits for tasks assessed     SENSATION: WFL  EDEMA:  None noted POSTURE: weight shift left  PALPATION: Thickened R quads, distally especially at patellar attachments, patella with good multidirectional mobility  LOWER EXTREMITY ROM:all wnl except R knee flexion in sitting 79, flexion supine to 70 Extension in supine -7 01/03/23:  R knee flexion in sitting 84, supine to 80, R knee extension(supine R  heel on roll) 0 01/10/23: R knee flexion 88 01/19/23: 93 flexion  LOWER EXTREMITY MMT: R quads with 20 degree lag for LAQ, 01/19/23: 7 degree lag, MMT 4+/5 R SLR with 10 degree lag 01/19/23: SLR with 3 degree lag R hamstring 4-, 01/19/23:  R hamstring 5/5 Hip extension, abduction wnl R plantarflexors wnl     FUNCTIONAL TESTS:  Functional gait assessment: 11  [facility] PT Assessment - 01/19/23 0001       Functional Gait  Assessment   Gait Level Surface Walks 20 ft in less than 5.5 sec, no assistive devices, good speed, no evidence for imbalance, normal gait pattern, deviates no more than 6 in outside of the 12 in walkway width.    Change in Gait Speed Able to change speed, demonstrates mild gait deviations, deviates 6-10 in outside of the 12 in walkway width, or no gait deviations, unable to achieve a major change in velocity, or uses a change in velocity, or uses an assistive device.    Gait with Horizontal Head Turns Performs head turns smoothly with no change in gait. Deviates no more than 6 in outside 12 in walkway width    Gait with Vertical Head Turns Performs head turns with no change in gait. Deviates no more than 6 in outside 12 in walkway width.    Gait and Pivot Turn Pivot turns safely within 3 sec and stops quickly with no loss of balance.    Step Over Obstacle Is able to step over one shoe box (4.5 in total height) but must slow down and adjust steps to clear box safely. May require verbal cueing.    Gait with Narrow Base of Support Ambulates 7-9 steps.    Gait with Eyes Closed Walks 20 ft, no assistive devices, good speed, no evidence of imbalance, normal gait pattern, deviates no more than 6 in outside 12 in walkway width. Ambulates 20 ft in less than 7 sec.    Ambulating Backwards Walks 20 ft, no assistive devices, good speed, no evidence for imbalance, normal gait    Steps Alternating feet, must use rail.    Total Score 25              [facility] PT Assessment - 01/19/23 0001        Functional Gait  Assessment   Gait Level Surface Walks 20 ft in less than 5.5 sec, no assistive devices, good speed, no evidence for imbalance, normal gait pattern, deviates no more than 6 in outside of the 12 in walkway width.    Change in Gait Speed Able to change speed, demonstrates mild gait deviations, deviates 6-10 in outside of the 12 in walkway width, or no gait deviations, unable to achieve a major change in velocity, or uses a change in velocity, or uses an assistive device.    Gait with Horizontal Head Turns Performs head turns smoothly with no change in gait. Deviates no more than 6 in outside 12 in walkway width    Gait with Vertical Head Turns Performs head turns with no change in  gait. Deviates no more than 6 in outside 12 in walkway width.    Gait and Pivot Turn Pivot turns safely within 3 sec and stops quickly with no loss of balance.    Step Over Obstacle Is able to step over one shoe box (4.5 in total height) but must slow down and adjust steps to clear box safely. May require verbal cueing.    Gait with Narrow Base of Support Ambulates 7-9 steps.    Gait with Eyes Closed Walks 20 ft, no assistive devices, good speed, no evidence of imbalance, normal gait pattern, deviates no more than 6 in outside 12 in walkway width. Ambulates 20 ft in less than 7 sec.    Ambulating Backwards Walks 20 ft, no assistive devices, good speed, no evidence for imbalance, normal gait    Steps Alternating feet, must use rail.    Total Score 25             GAIT: Distance walked: within department Assistive device utilized: Single point cane Level of assistance: SBA Comments: using knee brace which was sitting on top of R foot and hindering, circumduction R LE noted   TODAY'S TREATMENT:                                                                                                                              DATE:  01/19/23: Therapeutic exercise:  Nustep Ue's and LE's, level 5, 6 min for tissue  perfusion, ROM, endurance Seated long arc quads 3#, 10 reps, 3 sets Lateral step ups onto 1" step mass practice in ll bars Forward, backward step overs R foot planted on airex in ll bars, mass practice  Reasessed with FGA Seated stretch R knee flexion   01/17/23 Gait one lap around the widest part of bac parking lot NuStep L4 x4 min LE only  R knee PROM with end range holds Patella mobs LAQ 3lb 2x10  S2S LE on airex 2x15  6in step ups RLE  6in lateral step ups  Resisted gait 30lb all directions x4 each   01/12/23 NuStep L4 x 6 min R knee PROM with end range holds Patella mobs S2S holding red ball 2x10 Leg press RLE TKE 20lb 2x10 Resisted gait 30lb all directions x4 each Seated long arc quads R 3#, 10 x 2   01/10/23: instructed and progressed the patient with the following exercises designed to improve her R Le function.   Nustep UE's and LE's level 4, 6 min Supine for R hip flexor stretch, and knee flexion stretch with contract/relax into knee flexion Supine for therapy gun, cross friction massage R quads, TFL musculature .  Seated r knee flexion stretch to 88 degrees Gait outdoors for 6 min, on asphalt, emphasis on elongating stride length Standing for static balancing on rocker board, 10 sec holds, then side to side rocking Centered R foot on airex, stepping over and back with L LE, in ll bars, mass practice,  for terminal stability R LE/quads B knee flexion 25#, 10 x 2 sets Seated long arc quads R 3#, 10 x 2  Nustep 01/05/23 NuStep L5 x 6 min R knee PROM with end range holds Patella mobs RLE quad sets x10  S2S 2x10 Leg Press 20lb 2x10 Step ups 4in & 6in x5 each RLE LAQ 2lb 2x10 Seated quad stretch   01/03/23: -Nustep level 5, 6 min, Ue's and LE's -Seated R knee flexion stretch -Supine for patellar mobs, followed by assisted knee to chest, rocking mid range, contract/ relax for R knee flexion stretch, 5 sec holds, 10x -Supine SLR R, cues to engage distal quads and  avoid extensor lag, initially R extensor lag greater than 10 degrees, improved with repeated tactile cues R post knee to encourage full quad set prior to lift -seated long arc quads, 3 #, therapist assist for terminal knee ext, then eccentric lowering  mass practice -gait 100' emphasis on elongated L step length  -ll bars for lateral heel taps L, needed much verbal, tactile cues to isolate R knee flex/terminal extension, needed B UE support mass practice -ll bars for L towel slides, f/b, side/side, and cw/ccw needed B UE support, mass practice  12/29/22 NuStep L5 x 6 min  Resisted gait 4 way x 3 , 20lb  Long arc quads 2# 10 reps x 2 Seated AAROM R knee flexion S2S 2x10 Stair training 4 in 2 rails working on alternating pattern. Hamstring curls red 2x10 Heel raises 2x10   12/27/22: Gait training, no device or brace. Emphasized elongated stride L LE and rolling off toe R Lunges on 6" step with B UE support for AAROM Supine for patellar mobs,  Supine AAROM R knee flexion 75 degrees Prone TKE's 5 sec holds 15 x Supine R SLR 10x,measured R knee extension -3 today Nustep level 3, 5 min, Ue's and LE"s Long arc quads 2# 10 reps x 2 Seated AAROM R knee flexion, with contract relax, measured flex 82 in sitting Stanidng TKE's with ball behind R knee 5 sec holds,10 reps   12/24/22 R knee PROM Flex & Ext RLE quad sets x5 LAQ RLE 2x10 RLE HS curls 2x10 red  NuStep L 3 x 4 min  Standing march 2x10 SAQ RLE 1lb 2x10 RLE SLR x10, x5 R knee PROM Flex & Ext   PATIENT EDUCATION:  Education details: POC, goals Person educated: Patient and Spouse Education method: Explanation, Demonstration, Tactile cues, Verbal cues, and Handouts Education comprehension: verbalized understanding, returned demonstration, verbal cues required, tactile cues required, and needs further education  HOME EXERCISE PROGRAM: Access Code: [email or web address] URL: [email or web address] Date: 01/03/2023 Prepared by:  Linton Rump Deanne Bedgood  Exercises - Supine Heel Slide with Strap  - 1 x daily - 7 x weekly - 3 sets - 10 reps - Supine Straight Leg Raises  - 1 x daily - 7 x weekly - 3 sets - 10 reps - Seated Long Arc Quad  - 1 x daily - 7 x weekly - 3 sets - 10 reps - Sit to Stand  - 1 x daily - 7 x weekly - 3 sets - 10 reps - Standing Heel Raise with Support  - 1 x daily - 7 x weekly - 3 sets - 10 reps - Side Step Down with Unilateral Counter Support  - 1 x daily - 7 x weekly - 3 sets - 10 reps  - Lower Quarter Reach Combination  - 1 x daily - 7 x weekly - 3 sets -  10 reps Access Code: [email or web address] URL: [email or web address] Date: 12/29/2022 Prepared by: Debroah Baller  Exercises - Supine Heel Slide with Strap  - 1 x daily - 7 x weekly - 3 sets - 10 reps - Supine Straight Leg Raises  - 1 x daily - 7 x weekly - 3 sets - 10 reps - Seated Long Arc Quad  - 1 x daily - 7 x weekly - 3 sets - 10 reps - Sit to Stand  - 1 x daily - 7 x weekly - 3 sets - 10 reps - Standing Heel Raise with Support  - 1 x daily - 7 x weekly - 3 sets - 10 reps ASSESSMENT:  CLINICAL IMPRESSION: Patient is a 77 y.o. female who was seen today for physical therapy treatment for rehabilitation of function for R LE after a traumatic fracture R patella 5 months ago.  Focused today on further stretching her R knee into flexion and additional strengthening. Reassessed today for 10th visit.  She is making excellent progress.  Some difficulty with R LE unilateral standing activities. Progressed with outdoor ambulation incourging pt to attempt faster gait speed , but with little compliances.    Continues to benefit from skilled PT to recover.  OBJECTIVE IMPAIRMENTS: Abnormal gait, decreased activity tolerance, decreased endurance, decreased mobility, difficulty walking, decreased ROM, decreased strength, increased fascial restrictions, improper body mechanics, and pain.   ACTIVITY LIMITATIONS: carrying, lifting, bending, sitting, squatting,  and locomotion level  PARTICIPATION LIMITATIONS: meal prep, cleaning, laundry, driving, shopping, community activity, and yard work  PERSONAL FACTORS: Age, Time since onset of injury/illness/exacerbation, and 1 comorbidity: HTN  are also affecting patient's functional outcome.   REHAB POTENTIAL: Good  CLINICAL DECISION MAKING: Stable/uncomplicated  EVALUATION COMPLEXITY: Low   GOALS: Goals reviewed with patient? Yes  SHORT TERM GOALS: Target date: 12/29/22 I HEP Baseline:established today Goal status: IN PROGRESS  2.  Improve R knee ROM to 0- 90 Baseline: -6 to 80 Goal status: IN PROGRESS 01/03/23:  flexion to 84 today 01/10/23:  flexion to 88 today 01/19/23 flexion 92, goal met  LONG TERM GOALS: Target date: 03/09/23  Improve FGA from 11 to 28 Baseline: 11 Goal status: INITIAL 01/19/23: 25/28  2.  Foto 61 Baseline: 54 Goal status: INITIAL  3.  R quads strength 4+/5 for I transfers, gait efficiency, stairs, uneven terrain Baseline: 3-/5 Goal status: IN PROGRESS 01/19/23: 4+/5 goal met so far    PLAN:  PT FREQUENCY: 2x/week  PT DURATION: 12 weeks  PLANNED INTERVENTIONS: Therapeutic exercises, Therapeutic activity, Neuromuscular re-education, Balance training, Gait training, Patient/Family education, Self Care, and Joint mobilization  PLAN FOR NEXT SESSION: progress with gentle stretching R LE, continue to address R LE strengthening open and closed chain   Kourtnie Sachs L Kippy Melena, PT 01/19/2023, 5:31 PM

## 2023-01-24 ENCOUNTER — Encounter: Payer: Self-pay | Admitting: Physical Therapy

## 2023-01-24 ENCOUNTER — Ambulatory Visit: Payer: Medicare PPO | Admitting: Physical Therapy

## 2023-01-24 DIAGNOSIS — S76111D Strain of right quadriceps muscle, fascia and tendon, subsequent encounter: Secondary | ICD-10-CM

## 2023-01-24 DIAGNOSIS — S82041A Displaced comminuted fracture of right patella, initial encounter for closed fracture: Secondary | ICD-10-CM | POA: Diagnosis not present

## 2023-01-24 DIAGNOSIS — M25661 Stiffness of right knee, not elsewhere classified: Secondary | ICD-10-CM | POA: Diagnosis not present

## 2023-01-24 DIAGNOSIS — R262 Difficulty in walking, not elsewhere classified: Secondary | ICD-10-CM | POA: Diagnosis not present

## 2023-01-24 NOTE — Therapy (Signed)
OUTPATIENT PHYSICAL THERAPY LOWER EXTREMITY PROGRESS REPORT  Progress Note Reporting Period 12/15/22 to 01/19/23  See note below for Objective Data and Assessment of Progress/Goals.     Patient Name: Danielle Yoder MRN: [id number] DOB:1945/12/12, 77 y.o., female Today's Date: 01/24/2023  END OF SESSION:  PT End of Session - 01/24/23 1304     Visit Number 11    Date for PT Re-Evaluation 03/09/23    PT Start Time 1300    PT Stop Time 1345    PT Time Calculation (min) 45 min    Activity Tolerance Patient tolerated treatment well    Behavior During Therapy WFL for tasks assessed/performed                Past Medical History:  Diagnosis Date   Allergy    RHINITIS   Arthritis    Colonic polyp    Dyslipidemia    Hemorrhoids    Hypertension    Past Surgical History:  Procedure Laterality Date   FOOT SURGERY Right    bone shaved off; 2 surgeries (recurred 8 yrs later)   FOOT SURGERY Left    bone shaved off ; 2 surgeries (recurred)   ORIF PATELLA Right 09/08/2022   Procedure: OPEN REDUCTION INTERNAL FIXATION (ORIF) PATELLA;  Surgeon: London Sheer, MD;  Location: [facility];  Service: Orthopedics;  Laterality: Right;   Patient Active Problem List   Diagnosis Date Noted   Patellar fracture 09/08/2022   Unspecified fracture of right patella, initial encounter for closed fracture 09/07/2022   Osteopenia 02/17/2018   Family history of heart disease in female family member before age 75 05/31/2011   Arthritis 05/31/2011   Hyperlipidemia with target LDL less than 100 05/31/2011   Hypertension 05/31/2011   Allergic rhinitis due to pollen 05/31/2011   History of colonic polyps 05/31/2011    PCP: Sharlot Gowda, MD  REFERRING PROVIDER: Willia Craze, MD  REFERRING DIAG: Closed displaced comminuted fracture of right patella with routine healing, subsequent encounter, partial patellectomy.  THERAPY DIAG:  Displaced comminuted fracture of right patella, initial encounter  for closed fracture  Stiffness of right knee, not elsewhere classified  Difficulty in walking, not elsewhere classified  Strain of right quadriceps muscle, fascia and tendon, subsequent encounter  Rationale for Evaluation and Treatment: Rehabilitation  ONSET DATE: 09/08/22  SUBJECTIVE:   SUBJECTIVE STATEMENT: "Im tired" No pain really just stiff over the weekend  PERTINENT HISTORY: Larey Seat at home on corner of air mattress at [employer], landed on R knee fractured R patella, underwent surgery to debride and repair.  Has undergone home health PT, now referred to outpatient PT to continue rehab PAIN:  Are you having pain? Yes: NPRS scale: 0/10 Pain location: R ant knee Pain description: occasional twinges Aggravating factors: certain movements Relieving factors: resting  PRECAUTIONS: Other: from MD referral; Work on Right knee active range of motion and quad strengtheningWork on Right knee active range of motion and quad strengthening  WEIGHT BEARING RESTRICTIONS: No  FALLS:  Has patient fallen in last 6 months? Yes. Number of falls 1  LIVING ENVIRONMENT: Lives with: lives with their family and lives with their spouse Lives in: House/apartment Stairs: Yes: Internal: 11 steps; on right going up Has following equipment at home: Single point cane and Walker - 2 wheeled  OCCUPATION: retired  PLOF: Independent  PATIENT GOALS: return to I lifestyle  NEXT MD VISIT: December 22 2022  OBJECTIVE:   DIAGNOSTIC FINDINGS: na  PATIENT SURVEYS:  FOTO 3  COGNITION: Overall cognitive status: Within functional limits for tasks assessed     SENSATION: WFL  EDEMA:  None noted POSTURE: weight shift left  PALPATION: Thickened R quads, distally especially at patellar attachments, patella with good multidirectional mobility  LOWER EXTREMITY ROM:all wnl except R knee flexion in sitting 79, flexion supine to 70 Extension in supine -7 01/03/23:  R knee flexion in sitting 84, supine  to 80, R knee extension(supine R heel on roll) 0 01/10/23: R knee flexion 88 01/19/23: 93 flexion  LOWER EXTREMITY MMT: R quads with 20 degree lag for LAQ, 01/19/23: 7 degree lag, MMT 4+/5 R SLR with 10 degree lag 01/19/23: SLR with 3 degree lag R hamstring 4-, 01/19/23:  R hamstring 5/5 Hip extension, abduction wnl R plantarflexors wnl     FUNCTIONAL TESTS:  Functional gait assessment: 11       GAIT: Distance walked: within department Assistive device utilized: Single point cane Level of assistance: SBA Comments: using knee brace which was sitting on top of R foot and hindering, circumduction R LE noted   TODAY'S TREATMENT:                                                                                                                              DATE:  01/24/23 NuStep L 5 x 6 min LE only 6in step ups x10 each S2S on airex holding yellow ball.  Lateral 6in step ups x10 each LAQ 5lb 2x10 HS curls red RLE 2x10 Heel raises black bar 2x10     01/19/23: Therapeutic exercise:  Nustep Ue's and LE's, level 5, 6 min for tissue perfusion, ROM, endurance Seated long arc quads 3#, 10 reps, 3 sets Lateral step ups onto 1" step mass practice in ll bars Forward, backward step overs R foot planted on airex in ll bars, mass practice  Reasessed with FGA Seated stretch R knee flexion   01/17/23 Gait one lap around the widest part of bac parking lot NuStep L4 x4 min LE only  R knee PROM with end range holds Patella mobs LAQ 3lb 2x10  S2S LE on airex 2x15  6in step ups RLE  6in lateral step ups  Resisted gait 30lb all directions x4 each   01/12/23 NuStep L4 x 6 min R knee PROM with end range holds Patella mobs S2S holding red ball 2x10 Leg press RLE TKE 20lb 2x10 Resisted gait 30lb all directions x4 each Seated long arc quads R 3#, 10 x 2   01/10/23: instructed and progressed the patient with the following exercises designed to improve her R Le function.   Nustep UE's and LE's  level 4, 6 min Supine for R hip flexor stretch, and knee flexion stretch with contract/relax into knee flexion Supine for therapy gun, cross friction massage R quads, TFL musculature .  Seated r knee flexion stretch to 88 degrees Gait outdoors for 6 min, on asphalt, emphasis on elongating stride length Standing for static  balancing on rocker board, 10 sec holds, then side to side rocking Centered R foot on airex, stepping over and back with L LE, in ll bars, mass practice, for terminal stability R LE/quads B knee flexion 25#, 10 x 2 sets Seated long arc quads R 3#, 10 x 2  Nustep 01/05/23 NuStep L5 x 6 min R knee PROM with end range holds Patella mobs RLE quad sets x10  S2S 2x10 Leg Press 20lb 2x10 Step ups 4in & 6in x5 each RLE LAQ 2lb 2x10 Seated quad stretch   01/03/23: -Nustep level 5, 6 min, Ue's and LE's -Seated R knee flexion stretch -Supine for patellar mobs, followed by assisted knee to chest, rocking mid range, contract/ relax for R knee flexion stretch, 5 sec holds, 10x -Supine SLR R, cues to engage distal quads and avoid extensor lag, initially R extensor lag greater than 10 degrees, improved with repeated tactile cues R post knee to encourage full quad set prior to lift -seated long arc quads, 3 #, therapist assist for terminal knee ext, then eccentric lowering  mass practice -gait 100' emphasis on elongated L step length  -ll bars for lateral heel taps L, needed much verbal, tactile cues to isolate R knee flex/terminal extension, needed B UE support mass practice -ll bars for L towel slides, f/b, side/side, and cw/ccw needed B UE support, mass practice   PATIENT EDUCATION:  Education details: POC, goals Person educated: Patient and Spouse Education method: Explanation, Demonstration, Tactile cues, Verbal cues, and Handouts Education comprehension: verbalized understanding, returned demonstration, verbal cues required, tactile cues required, and needs further  education  HOME EXERCISE PROGRAM: Access Code: [email or web address] URL: [email or web address] Date: 01/03/2023 Prepared by: Linton Rump Speaks  Exercises - Supine Heel Slide with Strap  - 1 x daily - 7 x weekly - 3 sets - 10 reps - Supine Straight Leg Raises  - 1 x daily - 7 x weekly - 3 sets - 10 reps - Seated Long Arc Quad  - 1 x daily - 7 x weekly - 3 sets - 10 reps - Sit to Stand  - 1 x daily - 7 x weekly - 3 sets - 10 reps - Standing Heel Raise with Support  - 1 x daily - 7 x weekly - 3 sets - 10 reps - Side Step Down with Unilateral Counter Support  - 1 x daily - 7 x weekly - 3 sets - 10 reps  - Lower Quarter Reach Combination  - 1 x daily - 7 x weekly - 3 sets - 10 reps Access Code: [email or web address] URL: [email or web address] Date: 12/29/2022 Prepared by: Debroah Baller  Exercises - Supine Heel Slide with Strap  - 1 x daily - 7 x weekly - 3 sets - 10 reps - Supine Straight Leg Raises  - 1 x daily - 7 x weekly - 3 sets - 10 reps - Seated Long Arc Quad  - 1 x daily - 7 x weekly - 3 sets - 10 reps - Sit to Stand  - 1 x daily - 7 x weekly - 3 sets - 10 reps - Standing Heel Raise with Support  - 1 x daily - 7 x weekly - 3 sets - 10 reps ASSESSMENT:  CLINICAL IMPRESSION: Patient is a 77 y.o. female who was seen today for physical therapy treatment for rehabilitation of function for R LE after a traumatic fracture R patella 5 months ago.  Focused today on further stretching her R knee into flexion and additional  strengthening.   She is making excellent progress.  Some weakness with RLE step up interventions. Continues to benefit from skilled PT to recover.  OBJECTIVE IMPAIRMENTS: Abnormal gait, decreased activity tolerance, decreased endurance, decreased mobility, difficulty walking, decreased ROM, decreased strength, increased fascial restrictions, improper body mechanics, and pain.   ACTIVITY LIMITATIONS: carrying, lifting, bending, sitting, squatting, and locomotion  level  PARTICIPATION LIMITATIONS: meal prep, cleaning, laundry, driving, shopping, community activity, and yard work  PERSONAL FACTORS: Age, Time since onset of injury/illness/exacerbation, and 1 comorbidity: HTN  are also affecting patient's functional outcome.   REHAB POTENTIAL: Good  CLINICAL DECISION MAKING: Stable/uncomplicated  EVALUATION COMPLEXITY: Low   GOALS: Goals reviewed with patient? Yes  SHORT TERM GOALS: Target date: 12/29/22 I HEP Baseline:established today Goal status: IN PROGRESS  2.  Improve R knee ROM to 0- 90 Baseline: -6 to 80 Goal status: IN PROGRESS 01/03/23:  flexion to 84 today 01/10/23:  flexion to 88 today 01/19/23 flexion 92, goal met  LONG TERM GOALS: Target date: 03/09/23  Improve FGA from 11 to 28 Baseline: 11 Goal status: INITIAL 01/19/23: 25/28  2.  Foto 61 Baseline: 54 Goal status: INITIAL  3.  R quads strength 4+/5 for I transfers, gait efficiency, stairs, uneven terrain Baseline: 3-/5 Goal status: IN PROGRESS 01/19/23: 4+/5 goal met so far    PLAN:  PT FREQUENCY: 2x/week  PT DURATION: 12 weeks  PLANNED INTERVENTIONS: Therapeutic exercises, Therapeutic activity, Neuromuscular re-education, Balance training, Gait training, Patient/Family education, Self Care, and Joint mobilization  PLAN FOR NEXT SESSION: progress with gentle stretching R LE, continue to address R LE strengthening open and closed chain   Grayce Sessions, PTA 01/24/2023, 1:04 PM

## 2023-01-26 ENCOUNTER — Ambulatory Visit: Payer: Medicare PPO | Admitting: Physical Therapy

## 2023-01-26 ENCOUNTER — Encounter: Payer: Self-pay | Admitting: Physical Therapy

## 2023-01-26 DIAGNOSIS — S82041A Displaced comminuted fracture of right patella, initial encounter for closed fracture: Secondary | ICD-10-CM | POA: Diagnosis not present

## 2023-01-26 DIAGNOSIS — S76111D Strain of right quadriceps muscle, fascia and tendon, subsequent encounter: Secondary | ICD-10-CM

## 2023-01-26 DIAGNOSIS — M25661 Stiffness of right knee, not elsewhere classified: Secondary | ICD-10-CM

## 2023-01-26 DIAGNOSIS — R262 Difficulty in walking, not elsewhere classified: Secondary | ICD-10-CM

## 2023-01-26 NOTE — Therapy (Signed)
OUTPATIENT PHYSICAL THERAPY LOWER EXTREMITY PROGRESS REPORT    Patient Name: Danielle Yoder MRN: [id number] DOB:March 31, 1946, 77 y.o., female Today's Date: 01/26/2023  END OF SESSION:  PT End of Session - 01/26/23 1254     Visit Number 12    Date for PT Re-Evaluation 03/09/23    PT Start Time 1300    PT Stop Time 1345    PT Time Calculation (min) 45 min    Activity Tolerance Patient tolerated treatment well    Behavior During Therapy WFL for tasks assessed/performed                Past Medical History:  Diagnosis Date   Allergy    RHINITIS   Arthritis    Colonic polyp    Dyslipidemia    Hemorrhoids    Hypertension    Past Surgical History:  Procedure Laterality Date   FOOT SURGERY Right    bone shaved off; 2 surgeries (recurred 8 yrs later)   FOOT SURGERY Left    bone shaved off ; 2 surgeries (recurred)   ORIF PATELLA Right 09/08/2022   Procedure: OPEN REDUCTION INTERNAL FIXATION (ORIF) PATELLA;  Surgeon: London Sheer, MD;  Location: [facility];  Service: Orthopedics;  Laterality: Right;   Patient Active Problem List   Diagnosis Date Noted   Patellar fracture 09/08/2022   Unspecified fracture of right patella, initial encounter for closed fracture 09/07/2022   Osteopenia 02/17/2018   Family history of heart disease in female family member before age 40 05/31/2011   Arthritis 05/31/2011   Hyperlipidemia with target LDL less than 100 05/31/2011   Hypertension 05/31/2011   Allergic rhinitis due to pollen 05/31/2011   History of colonic polyps 05/31/2011    PCP: Sharlot Gowda, MD  REFERRING PROVIDER: Willia Craze, MD  REFERRING DIAG: Closed displaced comminuted fracture of right patella with routine healing, subsequent encounter, partial patellectomy.  THERAPY DIAG:  Displaced comminuted fracture of right patella, initial encounter for closed fracture  Stiffness of right knee, not elsewhere classified  Difficulty in walking, not elsewhere  classified  Strain of right quadriceps muscle, fascia and tendon, subsequent encounter  Rationale for Evaluation and Treatment: Rehabilitation  ONSET DATE: 09/08/22  SUBJECTIVE:   SUBJECTIVE STATEMENT: "Im ok" "No pain, just a little twinge at times"  PERTINENT HISTORY: Larey Seat at home on corner of air mattress at [employer], landed on R knee fractured R patella, underwent surgery to debride and repair.  Has undergone home health PT, now referred to outpatient PT to continue rehab PAIN:  Are you having pain? Yes: NPRS scale: 0/10 Pain location: R ant knee Pain description: occasional twinges Aggravating factors: certain movements Relieving factors: resting  PRECAUTIONS: Other: from MD referral; Work on Right knee active range of motion and quad strengtheningWork on Right knee active range of motion and quad strengthening  WEIGHT BEARING RESTRICTIONS: No  FALLS:  Has patient fallen in last 6 months? Yes. Number of falls 1  LIVING ENVIRONMENT: Lives with: lives with their family and lives with their spouse Lives in: House/apartment Stairs: Yes: Internal: 11 steps; on right going up Has following equipment at home: Single point cane and Walker - 2 wheeled  OCCUPATION: retired  PLOF: Independent  PATIENT GOALS: return to I lifestyle  NEXT MD VISIT: December 22 2022  OBJECTIVE:   DIAGNOSTIC FINDINGS: na  PATIENT SURVEYS:  FOTO 53  COGNITION: Overall cognitive status: Within functional limits for tasks assessed     SENSATION: WFL  EDEMA:  None  noted POSTURE: weight shift left  PALPATION: Thickened R quads, distally especially at patellar attachments, patella with good multidirectional mobility  LOWER EXTREMITY ROM:all wnl except R knee flexion in sitting 79, flexion supine to 70 Extension in supine -7 01/03/23:  R knee flexion in sitting 84, supine to 80, R knee extension(supine R heel on roll) 0 01/10/23: R knee flexion 88 01/19/23: 93 flexion  LOWER EXTREMITY  MMT: R quads with 20 degree lag for LAQ, 01/19/23: 7 degree lag, MMT 4+/5 R SLR with 10 degree lag 01/19/23: SLR with 3 degree lag R hamstring 4-, 01/19/23:  R hamstring 5/5 Hip extension, abduction wnl R plantarflexors wnl     FUNCTIONAL TESTS:  Functional gait assessment: 11       GAIT: Distance walked: within department Assistive device utilized: Single point cane Level of assistance: SBA Comments: using knee brace which was sitting on top of R foot and hindering, circumduction R LE noted   TODAY'S TREATMENT:                                                                                                                              DATE:  01/26/23 NuStep L 5 x 6 min LE only Lateral and forward  step ups 4in box on airex x10 each LAQ 5lb 2x12 HS curls green RLE 2x10 S2S on airex holding yellow ball 2x12 R knee PROM with end range holds Leg press 40lb 2x10  01/24/23 NuStep L 5 x 6 min LE only 6in step ups x10 each S2S on airex holding yellow ball.  Lateral 6in step ups x10 each LAQ 5lb 2x10 HS curls red RLE 2x10 Heel raises black bar 2x10   01/19/23: Therapeutic exercise:  Nustep Ue's and LE's, level 5, 6 min for tissue perfusion, ROM, endurance Seated long arc quads 3#, 10 reps, 3 sets Lateral step ups onto 1" step mass practice in ll bars Forward, backward step overs R foot planted on airex in ll bars, mass practice  Reasessed with FGA Seated stretch R knee flexion   01/17/23 Gait one lap around the widest part of bac parking lot NuStep L4 x4 min LE only  R knee PROM with end range holds Patella mobs LAQ 3lb 2x10  S2S LE on airex 2x15  6in step ups RLE  6in lateral step ups  Resisted gait 30lb all directions x4 each   01/12/23 NuStep L4 x 6 min R knee PROM with end range holds Patella mobs S2S holding red ball 2x10 Leg press RLE TKE 20lb 2x10 Resisted gait 30lb all directions x4 each Seated long arc quads R 3#, 10 x 2   01/10/23: instructed and  progressed the patient with the following exercises designed to improve her R Le function.   Nustep UE's and LE's level 4, 6 min Supine for R hip flexor stretch, and knee flexion stretch with contract/relax into knee flexion Supine for therapy gun, cross friction massage R quads, TFL  musculature .  Seated r knee flexion stretch to 88 degrees Gait outdoors for 6 min, on asphalt, emphasis on elongating stride length Standing for static balancing on rocker board, 10 sec holds, then side to side rocking Centered R foot on airex, stepping over and back with L LE, in ll bars, mass practice, for terminal stability R LE/quads B knee flexion 25#, 10 x 2 sets Seated long arc quads R 3#, 10 x 2  Nustep 01/05/23 NuStep L5 x 6 min R knee PROM with end range holds Patella mobs RLE quad sets x10  S2S 2x10 Leg Press 20lb 2x10 Step ups 4in & 6in x5 each RLE LAQ 2lb 2x10 Seated quad stretch   01/03/23: -Nustep level 5, 6 min, Ue's and LE's -Seated R knee flexion stretch -Supine for patellar mobs, followed by assisted knee to chest, rocking mid range, contract/ relax for R knee flexion stretch, 5 sec holds, 10x -Supine SLR R, cues to engage distal quads and avoid extensor lag, initially R extensor lag greater than 10 degrees, improved with repeated tactile cues R post knee to encourage full quad set prior to lift -seated long arc quads, 3 #, therapist assist for terminal knee ext, then eccentric lowering  mass practice -gait 100' emphasis on elongated L step length  -ll bars for lateral heel taps L, needed much verbal, tactile cues to isolate R knee flex/terminal extension, needed B UE support mass practice -ll bars for L towel slides, f/b, side/side, and cw/ccw needed B UE support, mass practice   PATIENT EDUCATION:  Education details: POC, goals Person educated: Patient and Spouse Education method: Explanation, Demonstration, Tactile cues, Verbal cues, and Handouts Education comprehension:  verbalized understanding, returned demonstration, verbal cues required, tactile cues required, and needs further education  HOME EXERCISE PROGRAM: Access Code: [email or web address] URL: [email or web address] Date: 01/03/2023 Prepared by: Linton Rump Speaks  Exercises - Supine Heel Slide with Strap  - 1 x daily - 7 x weekly - 3 sets - 10 reps - Supine Straight Leg Raises  - 1 x daily - 7 x weekly - 3 sets - 10 reps - Seated Long Arc Quad  - 1 x daily - 7 x weekly - 3 sets - 10 reps - Sit to Stand  - 1 x daily - 7 x weekly - 3 sets - 10 reps - Standing Heel Raise with Support  - 1 x daily - 7 x weekly - 3 sets - 10 reps - Side Step Down with Unilateral Counter Support  - 1 x daily - 7 x weekly - 3 sets - 10 reps  - Lower Quarter Reach Combination  - 1 x daily - 7 x weekly - 3 sets - 10 reps Access Code: [email or web address] URL: [email or web address] Date: 12/29/2022 Prepared by: Debroah Baller  Exercises - Supine Heel Slide with Strap  - 1 x daily - 7 x weekly - 3 sets - 10 reps - Supine Straight Leg Raises  - 1 x daily - 7 x weekly - 3 sets - 10 reps - Seated Long Arc Quad  - 1 x daily - 7 x weekly - 3 sets - 10 reps - Sit to Stand  - 1 x daily - 7 x weekly - 3 sets - 10 reps - Standing Heel Raise with Support  - 1 x daily - 7 x weekly - 3 sets - 10 reps ASSESSMENT:  CLINICAL IMPRESSION: Patient is a 77 y.o. female who was seen today for physical therapy treatment for rehabilitation of  function for R LE after a traumatic fracture R patella 5 months ago.  Focused today on further stretching her R knee into flexion and additional strengthening.   She is making excellent progress.  Some weakness with RLE step up interventions. Cues needed to hole quad contraction with LAQ. Increase resistance tolerated on leg press. Continues to benefit from skilled PT to recover.  OBJECTIVE IMPAIRMENTS: Abnormal gait, decreased activity tolerance, decreased endurance, decreased mobility, difficulty  walking, decreased ROM, decreased strength, increased fascial restrictions, improper body mechanics, and pain.   ACTIVITY LIMITATIONS: carrying, lifting, bending, sitting, squatting, and locomotion level  PARTICIPATION LIMITATIONS: meal prep, cleaning, laundry, driving, shopping, community activity, and yard work  PERSONAL FACTORS: Age, Time since onset of injury/illness/exacerbation, and 1 comorbidity: HTN  are also affecting patient's functional outcome.   REHAB POTENTIAL: Good  CLINICAL DECISION MAKING: Stable/uncomplicated  EVALUATION COMPLEXITY: Low   GOALS: Goals reviewed with patient? Yes  SHORT TERM GOALS: Target date: 12/29/22 I HEP Baseline:established today Goal status: IN PROGRESS  2.  Improve R knee ROM to 0- 90 Baseline: -6 to 80 Goal status: IN PROGRESS 01/03/23:  flexion to 84 today 01/10/23:  flexion to 88 today 01/19/23 flexion 92, goal met  LONG TERM GOALS: Target date: 03/09/23  Improve FGA from 11 to 28 Baseline: 11 Goal status: INITIAL 01/19/23: 25/28  2.  Foto 61 Baseline: 54 Goal status: INITIAL  3.  R quads strength 4+/5 for I transfers, gait efficiency, stairs, uneven terrain Baseline: 3-/5 Goal status: IN PROGRESS 01/19/23: 4+/5 goal met so far    PLAN:  PT FREQUENCY: 2x/week  PT DURATION: 12 weeks  PLANNED INTERVENTIONS: Therapeutic exercises, Therapeutic activity, Neuromuscular re-education, Balance training, Gait training, Patient/Family education, Self Care, and Joint mobilization  PLAN FOR NEXT SESSION: progress with gentle stretching R LE, continue to address R LE strengthening open and closed chain   Grayce Sessions, PTA 01/26/2023, 12:57 PM

## 2023-01-27 ENCOUNTER — Ambulatory Visit: Payer: Medicare PPO | Admitting: Orthopedic Surgery

## 2023-01-27 DIAGNOSIS — S82041D Displaced comminuted fracture of right patella, subsequent encounter for closed fracture with routine healing: Secondary | ICD-10-CM | POA: Diagnosis not present

## 2023-01-27 NOTE — Progress Notes (Signed)
Orthopedic Surgery Progress Note     Assessment: Patient is a 77 y.o. female with right patella fracture status post partial patellectomy  Surgery date: 09/08/2022 (~5 months post-op)     Plan: -Operative plans complete -Pain control: OTC medications as needed -Weight-bear as tolerated, no brace -Return to full activity as tolerated -She should finish out her PT sessions.  I encouraged her to continue to work on knee flexion exercises when at home -Return to clinic in 8 weeks, XRs at next visit: none   ___________________________________________________________________________   Subjective: Patient is not having any pain in her knee.  She is doing all her daily activities without any issue.  She has not got back to dancing yet.  Her knee does feel a little tight in the morning but loosens up quickly.  Feels that therapy has been beneficial and is continue to help her.  Denies paresthesias and numbness.  Is not using any ambulatory assistive devices at this time.   Physical Exam:   General: no acute distress, appears stated age Neurologic: alert, answering questions appropriately, following commands Respiratory: unlabored breathing on room air, symmetric chest rise Psychiatric: appropriate affect, normal cadence to speech   MSK:    -Right lower extremity             Incision is well-healed without any evidence of infection             Can actively extend knee against gravity with 5 degree extensor lag             Passive knee flexion to 90 degrees, actively can get to about 85 degrees EHL/TA/GSC intact Plantarflexes and dorsiflexes toes Sensation intact to light touch in sural, saphenous, tibial, deep peroneal, and superficial peroneal nerve distributions Foot warm and well perfused   Imaging:  None obtained at today's visit   Patient name: Danielle Yoder Patient MRN: [id number] Date: 01/27/23

## 2023-01-31 ENCOUNTER — Other Ambulatory Visit: Payer: Self-pay

## 2023-01-31 ENCOUNTER — Ambulatory Visit: Payer: Medicare PPO

## 2023-01-31 DIAGNOSIS — R262 Difficulty in walking, not elsewhere classified: Secondary | ICD-10-CM | POA: Diagnosis not present

## 2023-01-31 DIAGNOSIS — S76111D Strain of right quadriceps muscle, fascia and tendon, subsequent encounter: Secondary | ICD-10-CM | POA: Diagnosis not present

## 2023-01-31 DIAGNOSIS — S82041A Displaced comminuted fracture of right patella, initial encounter for closed fracture: Secondary | ICD-10-CM

## 2023-01-31 DIAGNOSIS — M25661 Stiffness of right knee, not elsewhere classified: Secondary | ICD-10-CM

## 2023-01-31 NOTE — Therapy (Signed)
OUTPATIENT PHYSICAL THERAPY LOWER EXTREMITY TREATMENT     Patient Name: Danielle Yoder MRN: [id number] DOB:Feb 28, 1946, 77 y.o., female Today's Date: 01/31/2023  END OF SESSION:  PT End of Session - 01/31/23 1254     Visit Number 13    Date for PT Re-Evaluation 03/09/23    Progress Note Due on Visit 20    PT Start Time 1254    PT Stop Time 1340    PT Time Calculation (min) 46 min                 Past Medical History:  Diagnosis Date   Allergy    RHINITIS   Arthritis    Colonic polyp    Dyslipidemia    Hemorrhoids    Hypertension    Past Surgical History:  Procedure Laterality Date   FOOT SURGERY Right    bone shaved off; 2 surgeries (recurred 8 yrs later)   FOOT SURGERY Left    bone shaved off ; 2 surgeries (recurred)   ORIF PATELLA Right 09/08/2022   Procedure: OPEN REDUCTION INTERNAL FIXATION (ORIF) PATELLA;  Surgeon: London Sheer, MD;  Location: [facility];  Service: Orthopedics;  Laterality: Right;   Patient Active Problem List   Diagnosis Date Noted   Patellar fracture 09/08/2022   Unspecified fracture of right patella, initial encounter for closed fracture 09/07/2022   Osteopenia 02/17/2018   Family history of heart disease in female family member before age 83 05/31/2011   Arthritis 05/31/2011   Hyperlipidemia with target LDL less than 100 05/31/2011   Hypertension 05/31/2011   Allergic rhinitis due to pollen 05/31/2011   History of colonic polyps 05/31/2011    PCP: Sharlot Gowda, MD  REFERRING PROVIDER: Willia Craze, MD  REFERRING DIAG: Closed displaced comminuted fracture of right patella with routine healing, subsequent encounter, partial patellectomy.  THERAPY DIAG:  Displaced comminuted fracture of right patella, initial encounter for closed fracture  Stiffness of right knee, not elsewhere classified  Difficulty in walking, not elsewhere classified  Strain of right quadriceps muscle, fascia and tendon, subsequent  encounter  Rationale for Evaluation and Treatment: Rehabilitation  ONSET DATE: 09/08/22  SUBJECTIVE:   SUBJECTIVE STATEMENT: Saw orthopedist last Friday.  He was pleased, thinks I need to bend my knee a little better.  No pain.  I dont see him again until end of June   PERTINENT HISTORY: Fell at home on corner of air mattress at [employer], landed on R knee fractured R patella, underwent surgery to debride and repair.  Has undergone home health PT, now referred to outpatient PT to continue rehab PAIN:  Are you having pain? Yes: NPRS scale: 0/10 Pain location: R ant knee Pain description: occasional twinges Aggravating factors: certain movements Relieving factors: resting  PRECAUTIONS: Other: from MD referral; Work on Right knee active range of motion and quad strengtheningWork on Right knee active range of motion and quad strengthening  WEIGHT BEARING RESTRICTIONS: No  FALLS:  Has patient fallen in last 6 months? Yes. Number of falls 1  LIVING ENVIRONMENT: Lives with: lives with their family and lives with their spouse Lives in: House/apartment Stairs: Yes: Internal: 11 steps; on right going up Has following equipment at home: Single point cane and Walker - 2 wheeled  OCCUPATION: retired  PLOF: Independent  PATIENT GOALS: return to I lifestyle  NEXT MD VISIT: December 22 2022  OBJECTIVE:   DIAGNOSTIC FINDINGS: na  PATIENT SURVEYS:  FOTO 4  COGNITION: Overall cognitive status: Within functional limits  for tasks assessed     SENSATION: WFL  EDEMA:  None noted POSTURE: weight shift left  PALPATION: Thickened R quads, distally especially at patellar attachments, patella with good multidirectional mobility  LOWER EXTREMITY ROM:all wnl except R knee flexion in sitting 79, flexion supine to 70 Extension in supine -7 01/03/23:  R knee flexion in sitting 84, supine to 80, R knee extension(supine R heel on roll) 0 01/10/23: R knee flexion 88 01/19/23: 93  flexion 01/31/23:  R knee flex 98 degrees  LOWER EXTREMITY MMT: R quads with 20 degree lag for LAQ, 01/19/23: 7 degree lag, MMT 4+/5 R SLR with 10 degree lag 01/19/23: SLR with 3 degree lag R hamstring 4-, 01/19/23:  R hamstring 5/5 Hip extension, abduction wnl R plantarflexors wnl     FUNCTIONAL TESTS:  Functional gait assessment: 11       GAIT: Distance walked: within department Assistive device utilized: Single point cane Level of assistance: SBA Comments: using knee brace which was sitting on top of R foot and hindering, circumduction R LE noted   TODAY'S TREATMENT:                                                                                                                              DATE:  01/31/23: Nustep LE's 6 min Leg press 40# B 20x 20# R 20x Seated for stretch R knee into flexion, with overpressure from PT, and intervals of cross friction massage distal R quads and inferior patellar glides., measured R knee flexion 98 today Lateral step ups to 4" step with rail, 20x Seated long arc quads with 5 #, 3 sets 10 Seated hamstring curls with green band, 10 x 2 Standing for B heel raises, forefeet on black bar to stretch post chain, strengthen B plantarflexors and foot intrinsics    01/26/23 NuStep L 5 x 6 min LE only Lateral and forward  step ups 4in box on airex x10 each LAQ 5lb 2x12 HS curls green RLE 2x10 S2S on airex holding yellow ball 2x12 R knee PROM with end range holds Leg press 40lb 2x10  01/24/23 NuStep L 5 x 6 min LE only 6in step ups x10 each S2S on airex holding yellow ball.  Lateral 6in step ups x10 each LAQ 5lb 2x10 HS curls red RLE 2x10 Heel raises black bar 2x10   01/19/23: Therapeutic exercise:  Nustep Ue's and LE's, level 5, 6 min for tissue perfusion, ROM, endurance Seated long arc quads 3#, 10 reps, 3 sets Lateral step ups onto 1" step mass practice in ll bars Forward, backward step overs R foot planted on airex in ll bars, mass  practice  Reasessed with FGA Seated stretch R knee flexion   01/17/23 Gait one lap around the widest part of bac parking lot NuStep L4 x4 min LE only  R knee PROM with end range holds Patella mobs LAQ 3lb 2x10  S2S LE on airex 2x15  6in step  ups RLE  6in lateral step ups  Resisted gait 30lb all directions x4 each   01/12/23 NuStep L4 x 6 min R knee PROM with end range holds Patella mobs S2S holding red ball 2x10 Leg press RLE TKE 20lb 2x10 Resisted gait 30lb all directions x4 each Seated long arc quads R 3#, 10 x 2   01/10/23: instructed and progressed the patient with the following exercises designed to improve her R Le function.   Nustep UE's and LE's level 4, 6 min Supine for R hip flexor stretch, and knee flexion stretch with contract/relax into knee flexion Supine for therapy gun, cross friction massage R quads, TFL musculature .  Seated r knee flexion stretch to 88 degrees Gait outdoors for 6 min, on asphalt, emphasis on elongating stride length Standing for static balancing on rocker board, 10 sec holds, then side to side rocking Centered R foot on airex, stepping over and back with L LE, in ll bars, mass practice, for terminal stability R LE/quads B knee flexion 25#, 10 x 2 sets Seated long arc quads R 3#, 10 x 2  Nustep 01/05/23 NuStep L5 x 6 min R knee PROM with end range holds Patella mobs RLE quad sets x10  S2S 2x10 Leg Press 20lb 2x10 Step ups 4in & 6in x5 each RLE LAQ 2lb 2x10 Seated quad stretch   01/03/23: -Nustep level 5, 6 min, Ue's and LE's -Seated R knee flexion stretch -Supine for patellar mobs, followed by assisted knee to chest, rocking mid range, contract/ relax for R knee flexion stretch, 5 sec holds, 10x -Supine SLR R, cues to engage distal quads and avoid extensor lag, initially R extensor lag greater than 10 degrees, improved with repeated tactile cues R post knee to encourage full quad set prior to lift -seated long arc quads, 3 #,  therapist assist for terminal knee ext, then eccentric lowering  mass practice -gait 100' emphasis on elongated L step length  -ll bars for lateral heel taps L, needed much verbal, tactile cues to isolate R knee flex/terminal extension, needed B UE support mass practice -ll bars for L towel slides, f/b, side/side, and cw/ccw needed B UE support, mass practice   PATIENT EDUCATION:  Education details: POC, goals Person educated: Patient and Spouse Education method: Explanation, Demonstration, Tactile cues, Verbal cues, and Handouts Education comprehension: verbalized understanding, returned demonstration, verbal cues required, tactile cues required, and needs further education  HOME EXERCISE PROGRAM: Access Code: [email or web address] URL: [email or web address] Date: 01/03/2023 Prepared by: Linton Rump Miraya Cudney  Exercises - Supine Heel Slide with Strap  - 1 x daily - 7 x weekly - 3 sets - 10 reps - Supine Straight Leg Raises  - 1 x daily - 7 x weekly - 3 sets - 10 reps - Seated Long Arc Quad  - 1 x daily - 7 x weekly - 3 sets - 10 reps - Sit to Stand  - 1 x daily - 7 x weekly - 3 sets - 10 reps - Standing Heel Raise with Support  - 1 x daily - 7 x weekly - 3 sets - 10 reps - Side Step Down with Unilateral Counter Support  - 1 x daily - 7 x weekly - 3 sets - 10 reps  - Lower Quarter Reach Combination  - 1 x daily - 7 x weekly - 3 sets - 10 reps Access Code: [email or web address] URL: [email or web address] Date: 12/29/2022 Prepared by: Debroah Baller  Exercises - Supine Heel Slide with Strap  - 1  x daily - 7 x weekly - 3 sets - 10 reps - Supine Straight Leg Raises  - 1 x daily - 7 x weekly - 3 sets - 10 reps - Seated Long Arc Quad  - 1 x daily - 7 x weekly - 3 sets - 10 reps - Sit to Stand  - 1 x daily - 7 x weekly - 3 sets - 10 reps - Standing Heel Raise with Support  - 1 x daily - 7 x weekly - 3 sets - 10 reps ASSESSMENT:  CLINICAL IMPRESSION: Patient is a 77 y.o. female who was seen  today for physical therapy treatment for rehabilitation of function for R LE after a traumatic fracture R patella 5 months ago.  Focused today on further stretching her R knee into flexion and additional strengthening.  Improved flexibility noted R knee for flexion.  Building muscle mass R quads. Continues to benefit from skilled PT to recover.  OBJECTIVE IMPAIRMENTS: Abnormal gait, decreased activity tolerance, decreased endurance, decreased mobility, difficulty walking, decreased ROM, decreased strength, increased fascial restrictions, improper body mechanics, and pain.   ACTIVITY LIMITATIONS: carrying, lifting, bending, sitting, squatting, and locomotion level  PARTICIPATION LIMITATIONS: meal prep, cleaning, laundry, driving, shopping, community activity, and yard work  PERSONAL FACTORS: Age, Time since onset of injury/illness/exacerbation, and 1 comorbidity: HTN  are also affecting patient's functional outcome.   REHAB POTENTIAL: Good  CLINICAL DECISION MAKING: Stable/uncomplicated  EVALUATION COMPLEXITY: Low   GOALS: Goals reviewed with patient? Yes  SHORT TERM GOALS: Target date: 12/29/22 I HEP Baseline:established today Goal status: IN PROGRESS  2.  Improve R knee ROM to 0- 90 Baseline: -6 to 80 Goal status: IN PROGRESS 01/03/23:  flexion to 84 today 01/10/23:  flexion to 88 today 01/19/23 flexion 92, goal met  LONG TERM GOALS: Target date: 03/09/23  Improve FGA from 11 to 28 Baseline: 11 Goal status: INITIAL 01/19/23: 25/28  2.  Foto 61 Baseline: 54 Goal status: INITIAL  3.  R quads strength 4+/5 for I transfers, gait efficiency, stairs, uneven terrain Baseline: 3-/5 Goal status: IN PROGRESS 01/19/23: 4+/5 goal met so far    PLAN:  PT FREQUENCY: 2x/week  PT DURATION: 12 weeks  PLANNED INTERVENTIONS: Therapeutic exercises, Therapeutic activity, Neuromuscular re-education, Balance training, Gait training, Patient/Family education, Self Care, and Joint  mobilization  PLAN FOR NEXT SESSION: progress with gentle stretching R LE, continue to address R LE strengthening open and closed chain   Karessa Onorato L Kayla Deshaies, PT 01/31/2023, 2:19 PM

## 2023-02-02 ENCOUNTER — Ambulatory Visit: Payer: Medicare PPO

## 2023-02-02 ENCOUNTER — Other Ambulatory Visit: Payer: Self-pay

## 2023-02-02 DIAGNOSIS — S76111D Strain of right quadriceps muscle, fascia and tendon, subsequent encounter: Secondary | ICD-10-CM

## 2023-02-02 DIAGNOSIS — S82041A Displaced comminuted fracture of right patella, initial encounter for closed fracture: Secondary | ICD-10-CM

## 2023-02-02 DIAGNOSIS — M25661 Stiffness of right knee, not elsewhere classified: Secondary | ICD-10-CM | POA: Diagnosis not present

## 2023-02-02 DIAGNOSIS — R262 Difficulty in walking, not elsewhere classified: Secondary | ICD-10-CM

## 2023-02-02 NOTE — Therapy (Signed)
OUTPATIENT PHYSICAL THERAPY LOWER EXTREMITY TREATMENT     Patient Name: Danielle Yoder MRN: [id number] DOB:12-07-1945, 77 y.o., female Today's Date: 02/02/2023  END OF SESSION:  PT End of Session - 02/02/23 1555     Visit Number 14    Date for PT Re-Evaluation 03/09/23    Progress Note Due on Visit 20    PT Start Time 1300    PT Stop Time 1345    PT Time Calculation (min) 45 min    Activity Tolerance Patient tolerated treatment well    Behavior During Therapy WFL for tasks assessed/performed                  Past Medical History:  Diagnosis Date   Allergy    RHINITIS   Arthritis    Colonic polyp    Dyslipidemia    Hemorrhoids    Hypertension    Past Surgical History:  Procedure Laterality Date   FOOT SURGERY Right    bone shaved off; 2 surgeries (recurred 8 yrs later)   FOOT SURGERY Left    bone shaved off ; 2 surgeries (recurred)   ORIF PATELLA Right 09/08/2022   Procedure: OPEN REDUCTION INTERNAL FIXATION (ORIF) PATELLA;  Surgeon: London Sheer, MD;  Location: [facility];  Service: Orthopedics;  Laterality: Right;   Patient Active Problem List   Diagnosis Date Noted   Patellar fracture 09/08/2022   Unspecified fracture of right patella, initial encounter for closed fracture 09/07/2022   Osteopenia 02/17/2018   Family history of heart disease in female family member before age 22 05/31/2011   Arthritis 05/31/2011   Hyperlipidemia with target LDL less than 100 05/31/2011   Hypertension 05/31/2011   Allergic rhinitis due to pollen 05/31/2011   History of colonic polyps 05/31/2011    PCP: Sharlot Gowda, MD  REFERRING PROVIDER: Willia Craze, MD  REFERRING DIAG: Closed displaced comminuted fracture of right patella with routine healing, subsequent encounter, partial patellectomy.  THERAPY DIAG:  Displaced comminuted fracture of right patella, initial encounter for closed fracture  Stiffness of right knee, not elsewhere classified  Difficulty in  walking, not elsewhere classified  Strain of right quadriceps muscle, fascia and tendon, subsequent encounter  Rationale for Evaluation and Treatment: Rehabilitation  ONSET DATE: 09/08/22  SUBJECTIVE:   SUBJECTIVE STATEMENT: Saw orthopedist last Friday.  He was pleased, thinks I need to bend my knee a little better.  No pain.  I dont see him again until end of June   PERTINENT HISTORY: Fell at home on corner of air mattress at [employer], landed on R knee fractured R patella, underwent surgery to debride and repair.  Has undergone home health PT, now referred to outpatient PT to continue rehab PAIN:  Are you having pain? Yes: NPRS scale: 0/10 Pain location: R ant knee Pain description: occasional twinges Aggravating factors: certain movements Relieving factors: resting  PRECAUTIONS: Other: from MD referral; Work on Right knee active range of motion and quad strengtheningWork on Right knee active range of motion and quad strengthening  WEIGHT BEARING RESTRICTIONS: No  FALLS:  Has patient fallen in last 6 months? Yes. Number of falls 1  LIVING ENVIRONMENT: Lives with: lives with their family and lives with their spouse Lives in: House/apartment Stairs: Yes: Internal: 11 steps; on right going up Has following equipment at home: Single point cane and Walker - 2 wheeled  OCCUPATION: retired  PLOF: Independent  PATIENT GOALS: return to I lifestyle  NEXT MD VISIT: December 22 2022  OBJECTIVE:   DIAGNOSTIC FINDINGS: na  PATIENT SURVEYS:  FOTO 18  COGNITION: Overall cognitive status: Within functional limits for tasks assessed     SENSATION: WFL  EDEMA:  None noted POSTURE: weight shift left  PALPATION: Thickened R quads, distally especially at patellar attachments, patella with good multidirectional mobility  LOWER EXTREMITY ROM:all wnl except R knee flexion in sitting 79, flexion supine to 70 Extension in supine -7 01/03/23:  R knee flexion in sitting 84, supine  to 80, R knee extension(supine R heel on roll) 0 01/10/23: R knee flexion 88 01/19/23: 93 flexion 01/31/23:  R knee flex 98 degrees  LOWER EXTREMITY MMT: R quads with 20 degree lag for LAQ, 01/19/23: 7 degree lag, MMT 4+/5 R SLR with 10 degree lag 01/19/23: SLR with 3 degree lag R hamstring 4-, 01/19/23:  R hamstring 5/5 Hip extension, abduction wnl R plantarflexors wnl     FUNCTIONAL TESTS:  Functional gait assessment: 11       GAIT: Distance walked: within department Assistive device utilized: Single point cane Level of assistance: SBA Comments: using knee brace which was sitting on top of R foot and hindering, circumduction R LE noted   TODAY'S TREATMENT:                                                                                                                              DATE:  02/02/23: Therapeutic exercise: Nustep level 4, Ue's and LE's 7 min Leg press, 40#, 2 x 10 reps Leg press R LE only 20#, 2 x 10 reps. Gait outdoors for 6 min, down/up 2 curbs, on inclined asphalt.   In ll bars for lunges on compliant side of BOSU, mass practice each leg, needed more guarding with L foot on the BOSU In ll bars for static standing on smooth side BOSU, also mini squats, all with CGA from PT Standing on BOSU in ll bars for side to side rocking, B UE support Seated long arc quads 5#, 10 x 2 Seated deep cross friction massage R distal quads, then manual stretching into flexion to tolerance, 3 x with overpressure by therapist, 10 sec holds   01/31/23: Nustep LE's 6 min Leg press 40# B 20x 20# R 20x Seated for stretch R knee into flexion, with overpressure from PT, and intervals of cross friction massage distal R quads and inferior patellar glides., measured R knee flexion 98 today Lateral step ups to 4" step with rail, 20x Seated long arc quads with 5 #, 3 sets 10 Seated hamstring curls with green band, 10 x 2 Standing for B heel raises, forefeet on black bar to stretch post chain,  strengthen B plantarflexors and foot intrinsics    01/26/23 NuStep L 5 x 6 min LE only Lateral and forward  step ups 4in box on airex x10 each LAQ 5lb 2x12 HS curls green RLE 2x10 S2S on airex holding yellow ball 2x12 R knee PROM with end range  holds Leg press 40lb 2x10  01/24/23 NuStep L 5 x 6 min LE only 6in step ups x10 each S2S on airex holding yellow ball.  Lateral 6in step ups x10 each LAQ 5lb 2x10 HS curls red RLE 2x10 Heel raises black bar 2x10   01/19/23: Therapeutic exercise:  Nustep Ue's and LE's, level 5, 6 min for tissue perfusion, ROM, endurance Seated long arc quads 3#, 10 reps, 3 sets Lateral step ups onto 1" step mass practice in ll bars Forward, backward step overs R foot planted on airex in ll bars, mass practice  Reasessed with FGA Seated stretch R knee flexion   01/17/23 Gait one lap around the widest part of bac parking lot NuStep L4 x4 min LE only  R knee PROM with end range holds Patella mobs LAQ 3lb 2x10  S2S LE on airex 2x15  6in step ups RLE  6in lateral step ups  Resisted gait 30lb all directions x4 each   01/12/23 NuStep L4 x 6 min R knee PROM with end range holds Patella mobs S2S holding red ball 2x10 Leg press RLE TKE 20lb 2x10 Resisted gait 30lb all directions x4 each Seated long arc quads R 3#, 10 x 2   01/10/23: instructed and progressed the patient with the following exercises designed to improve her R Le function.   Nustep UE's and LE's level 4, 6 min Supine for R hip flexor stretch, and knee flexion stretch with contract/relax into knee flexion Supine for therapy gun, cross friction massage R quads, TFL musculature .  Seated r knee flexion stretch to 88 degrees Gait outdoors for 6 min, on asphalt, emphasis on elongating stride length Standing for static balancing on rocker board, 10 sec holds, then side to side rocking Centered R foot on airex, stepping over and back with L LE, in ll bars, mass practice, for terminal stability  R LE/quads B knee flexion 25#, 10 x 2 sets Seated long arc quads R 3#, 10 x 2  Nustep 01/05/23 NuStep L5 x 6 min R knee PROM with end range holds Patella mobs RLE quad sets x10  S2S 2x10 Leg Press 20lb 2x10 Step ups 4in & 6in x5 each RLE LAQ 2lb 2x10 Seated quad stretch   01/03/23: -Nustep level 5, 6 min, Ue's and LE's -Seated R knee flexion stretch -Supine for patellar mobs, followed by assisted knee to chest, rocking mid range, contract/ relax for R knee flexion stretch, 5 sec holds, 10x -Supine SLR R, cues to engage distal quads and avoid extensor lag, initially R extensor lag greater than 10 degrees, improved with repeated tactile cues R post knee to encourage full quad set prior to lift -seated long arc quads, 3 #, therapist assist for terminal knee ext, then eccentric lowering  mass practice -gait 100' emphasis on elongated L step length  -ll bars for lateral heel taps L, needed much verbal, tactile cues to isolate R knee flex/terminal extension, needed B UE support mass practice -ll bars for L towel slides, f/b, side/side, and cw/ccw needed B UE support, mass practice   PATIENT EDUCATION:  Education details: POC, goals Person educated: Patient and Spouse Education method: Explanation, Demonstration, Tactile cues, Verbal cues, and Handouts Education comprehension: verbalized understanding, returned demonstration, verbal cues required, tactile cues required, and needs further education  HOME EXERCISE PROGRAM: Access Code: [email or web address] URL: [email or web address] Date: 01/03/2023 Prepared by: Caralee Ates  Exercises - Supine Heel Slide with Strap  - 1 x daily - 7 x weekly - 3  sets - 10 reps - Supine Straight Leg Raises  - 1 x daily - 7 x weekly - 3 sets - 10 reps - Seated Long Arc Quad  - 1 x daily - 7 x weekly - 3 sets - 10 reps - Sit to Stand  - 1 x daily - 7 x weekly - 3 sets - 10 reps - Standing Heel Raise with Support  - 1 x daily - 7 x weekly - 3 sets - 10  reps - Side Step Down with Unilateral Counter Support  - 1 x daily - 7 x weekly - 3 sets - 10 reps  - Lower Quarter Reach Combination  - 1 x daily - 7 x weekly - 3 sets - 10 reps Access Code: [email or web address] URL: [email or web address] Date: 12/29/2022 Prepared by: Debroah Baller  Exercises - Supine Heel Slide with Strap  - 1 x daily - 7 x weekly - 3 sets - 10 reps - Supine Straight Leg Raises  - 1 x daily - 7 x weekly - 3 sets - 10 reps - Seated Long Arc Quad  - 1 x daily - 7 x weekly - 3 sets - 10 reps - Sit to Stand  - 1 x daily - 7 x weekly - 3 sets - 10 reps - Standing Heel Raise with Support  - 1 x daily - 7 x weekly - 3 sets - 10 reps ASSESSMENT:  CLINICAL IMPRESSION: Patient is a 77 y.o. female who was seen today for physical therapy treatment for rehabilitation of function for R LE after a traumatic fracture R patella 5 months ago.  Focused today on further stretching her R knee into flexion and additional strengthening and balance with varying weight bearing surfaces.  She tolerated all activities well.    Improved flexibility noted R knee for flexion.  Building muscle mass R quads. Continues to benefit from skilled PT to recover.  OBJECTIVE IMPAIRMENTS: Abnormal gait, decreased activity tolerance, decreased endurance, decreased mobility, difficulty walking, decreased ROM, decreased strength, increased fascial restrictions, improper body mechanics, and pain.   ACTIVITY LIMITATIONS: carrying, lifting, bending, sitting, squatting, and locomotion level  PARTICIPATION LIMITATIONS: meal prep, cleaning, laundry, driving, shopping, community activity, and yard work  PERSONAL FACTORS: Age, Time since onset of injury/illness/exacerbation, and 1 comorbidity: HTN  are also affecting patient's functional outcome.   REHAB POTENTIAL: Good  CLINICAL DECISION MAKING: Stable/uncomplicated  EVALUATION COMPLEXITY: Low   GOALS: Goals reviewed with patient? Yes  SHORT TERM GOALS:  Target date: 12/29/22 I HEP Baseline:established today Goal status: IN PROGRESS  2.  Improve R knee ROM to 0- 90 Baseline: -6 to 80 Goal status: IN PROGRESS 01/03/23:  flexion to 84 today 01/10/23:  flexion to 88 today 01/19/23 flexion 92, goal met  LONG TERM GOALS: Target date: 03/09/23  Improve FGA from 11 to 28 Baseline: 11 Goal status: INITIAL 01/19/23: 25/28  2.  Foto 61 Baseline: 54 Goal status: INITIAL  3.  R quads strength 4+/5 for I transfers, gait efficiency, stairs, uneven terrain Baseline: 3-/5 Goal status: IN PROGRESS 01/19/23: 4+/5 goal met so far    PLAN:  PT FREQUENCY: 2x/week  PT DURATION: 12 weeks  PLANNED INTERVENTIONS: Therapeutic exercises, Therapeutic activity, Neuromuscular re-education, Balance training, Gait training, Patient/Family education, Self Care, and Joint mobilization  PLAN FOR NEXT SESSION: progress with gentle stretching R LE, continue to address R LE strengthening open and closed chain   Beldon Nowling L Volanda Mangine, PT 02/02/2023, 3:56 PM

## 2023-02-08 ENCOUNTER — Encounter: Payer: Self-pay | Admitting: Physical Therapy

## 2023-02-08 ENCOUNTER — Ambulatory Visit: Payer: Medicare PPO | Admitting: Physical Therapy

## 2023-02-08 DIAGNOSIS — M25661 Stiffness of right knee, not elsewhere classified: Secondary | ICD-10-CM | POA: Diagnosis not present

## 2023-02-08 DIAGNOSIS — S82041A Displaced comminuted fracture of right patella, initial encounter for closed fracture: Secondary | ICD-10-CM | POA: Diagnosis not present

## 2023-02-08 DIAGNOSIS — S76111D Strain of right quadriceps muscle, fascia and tendon, subsequent encounter: Secondary | ICD-10-CM | POA: Diagnosis not present

## 2023-02-08 DIAGNOSIS — R262 Difficulty in walking, not elsewhere classified: Secondary | ICD-10-CM | POA: Diagnosis not present

## 2023-02-08 NOTE — Therapy (Signed)
OUTPATIENT PHYSICAL THERAPY LOWER EXTREMITY TREATMENT     Patient Name: Danielle Yoder MRN: [id number] DOB:[birth year], 77 y.o., female Today's Date: 02/08/2023  END OF SESSION:  PT End of Session - 02/08/23 1344     Visit Number 15    Date for PT Re-Evaluation 03/09/23    PT Start Time 1345    PT Stop Time 1430    PT Time Calculation (min) 45 min    Activity Tolerance Patient tolerated treatment well    Behavior During Therapy WFL for tasks assessed/performed                  Past Medical History:  Diagnosis Date   Allergy    RHINITIS   Arthritis    Colonic polyp    Dyslipidemia    Hemorrhoids    Hypertension    Past Surgical History:  Procedure Laterality Date   FOOT SURGERY Right    bone shaved off; 2 surgeries (recurred 8 yrs later)   FOOT SURGERY Left    bone shaved off ; 2 surgeries (recurred)   ORIF PATELLA Right 09/08/2022   Procedure: OPEN REDUCTION INTERNAL FIXATION (ORIF) PATELLA;  Surgeon: London Sheer, MD;  Location: [facility];  Service: Orthopedics;  Laterality: Right;   Patient Active Problem List   Diagnosis Date Noted   Patellar fracture 09/08/2022   Unspecified fracture of right patella, initial encounter for closed fracture 09/07/2022   Osteopenia 02/17/2018   Family history of heart disease in female family member before age 24 05/31/2011   Arthritis 05/31/2011   Hyperlipidemia with target LDL less than 100 05/31/2011   Hypertension 05/31/2011   Allergic rhinitis due to pollen 05/31/2011   History of colonic polyps 05/31/2011    PCP: Sharlot Gowda, MD  REFERRING PROVIDER: Willia Craze, MD  REFERRING DIAG: Closed displaced comminuted fracture of right patella with routine healing, subsequent encounter, partial patellectomy.  THERAPY DIAG:  Displaced comminuted fracture of right patella, initial encounter for closed fracture  Stiffness of right knee, not elsewhere classified  Difficulty in walking, not elsewhere  classified  Strain of right quadriceps muscle, fascia and tendon, subsequent encounter  Rationale for Evaluation and Treatment: Rehabilitation  ONSET DATE: 09/08/22  SUBJECTIVE:   SUBJECTIVE STATEMENT: "I am doing ok"  PERTINENT HISTORY: Fell at home on corner of air mattress at [employer], landed on R knee fractured R patella, underwent surgery to debride and repair.  Has undergone home health PT, now referred to outpatient PT to continue rehab PAIN:  Are you having pain? Yes: NPRS scale: 0/10 Pain location: R ant knee Pain description: occasional twinges Aggravating factors: certain movements Relieving factors: resting  PRECAUTIONS: Other: from MD referral; Work on Right knee active range of motion and quad strengtheningWork on Right knee active range of motion and quad strengthening  WEIGHT BEARING RESTRICTIONS: No  FALLS:  Has patient fallen in last 6 months? Yes. Number of falls 1  LIVING ENVIRONMENT: Lives with: lives with their family and lives with their spouse Lives in: House/apartment Stairs: Yes: Internal: 11 steps; on right going up Has following equipment at home: Single point cane and Walker - 2 wheeled  OCCUPATION: retired  PLOF: Independent  PATIENT GOALS: return to I lifestyle  NEXT MD VISIT: December 22 2022  OBJECTIVE:   DIAGNOSTIC FINDINGS: na  PATIENT SURVEYS:  FOTO 60  COGNITION: Overall cognitive status: Within functional limits for tasks assessed     SENSATION: WFL  EDEMA:  None noted POSTURE: weight shift  left  PALPATION: Thickened R quads, distally especially at patellar attachments, patella with good multidirectional mobility  LOWER EXTREMITY ROM:all wnl except R knee flexion in sitting 79, flexion supine to 70 Extension in supine -7 01/03/23:  R knee flexion in sitting 84, supine to 80, R knee extension(supine R heel on roll) 0 01/10/23: R knee flexion 88 01/19/23: 93 flexion 01/31/23:  R knee flex 98 degrees  LOWER EXTREMITY  MMT: R quads with 20 degree lag for LAQ, 01/19/23: 7 degree lag, MMT 4+/5 R SLR with 10 degree lag 01/19/23: SLR with 3 degree lag R hamstring 4-, 01/19/23:  R hamstring 5/5 Hip extension, abduction wnl R plantarflexors wnl     FUNCTIONAL TESTS:  Functional gait assessment: 11       GAIT: Distance walked: within department Assistive device utilized: Single point cane Level of assistance: SBA Comments: using knee brace which was sitting on top of R foot and hindering, circumduction R LE noted   TODAY'S TREATMENT:                                                                                                                              DATE:  02/08/23 NuStep L5 x 6 min LE only R knee PROM with end range holds S2S on airex holding yellow ball 2x12 Leg press, 40#  2x12 reps Forward & Lateral step ups 4in box on airex x 10 Heel raises black bar 2x12 HS curls 25lb 2x10 Leg Ext 10lb 2x10     02/02/23: Therapeutic exercise: Nustep level 4, Ue's and LE's 7 min Leg press, 40#, 2 x 10 reps Leg press R LE only 20#, 2 x 10 reps. Gait outdoors for 6 min, down/up 2 curbs, on inclined asphalt.   In ll bars for lunges on compliant side of BOSU, mass practice each leg, needed more guarding with L foot on the BOSU In ll bars for static standing on smooth side BOSU, also mini squats, all with CGA from PT Standing on BOSU in ll bars for side to side rocking, B UE support Seated long arc quads 5#, 10 x 2 Seated deep cross friction massage R distal quads, then manual stretching into flexion to tolerance, 3 x with overpressure by therapist, 10 sec holds   01/31/23: Nustep LE's 6 min Leg press 40# B 20x 20# R 20x Seated for stretch R knee into flexion, with overpressure from PT, and intervals of cross friction massage distal R quads and inferior patellar glides., measured R knee flexion 98 today Lateral step ups to 4" step with rail, 20x Seated long arc quads with 5 #, 3 sets 10 Seated  hamstring curls with green band, 10 x 2 Standing for B heel raises, forefeet on black bar to stretch post chain, strengthen B plantarflexors and foot intrinsics     PATIENT EDUCATION:  Education details: POC, goals Person educated: Patient and Spouse Education method: Explanation, Demonstration, Tactile cues, Verbal cues, and Handouts  Education comprehension: verbalized understanding, returned demonstration, verbal cues required, tactile cues required, and needs further education  HOME EXERCISE PROGRAM: Access Code: [email or web address] URL: [email or web address] Date: 01/03/2023 Prepared by: Linton Rump Speaks  Exercises - Supine Heel Slide with Strap  - 1 x daily - 7 x weekly - 3 sets - 10 reps - Supine Straight Leg Raises  - 1 x daily - 7 x weekly - 3 sets - 10 reps - Seated Long Arc Quad  - 1 x daily - 7 x weekly - 3 sets - 10 reps - Sit to Stand  - 1 x daily - 7 x weekly - 3 sets - 10 reps - Standing Heel Raise with Support  - 1 x daily - 7 x weekly - 3 sets - 10 reps - Side Step Down with Unilateral Counter Support  - 1 x daily - 7 x weekly - 3 sets - 10 reps  - Lower Quarter Reach Combination  - 1 x daily - 7 x weekly - 3 sets - 10 reps Access Code: [email or web address] URL: [email or web address] Date: 12/29/2022 Prepared by: Debroah Baller  Exercises - Supine Heel Slide with Strap  - 1 x daily - 7 x weekly - 3 sets - 10 reps - Supine Straight Leg Raises  - 1 x daily - 7 x weekly - 3 sets - 10 reps - Seated Long Arc Quad  - 1 x daily - 7 x weekly - 3 sets - 10 reps - Sit to Stand  - 1 x daily - 7 x weekly - 3 sets - 10 reps - Standing Heel Raise with Support  - 1 x daily - 7 x weekly - 3 sets - 10 reps ASSESSMENT:  CLINICAL IMPRESSION: Patient is a 77 y.o. female who was seen today for physical therapy treatment for rehabilitation of function for R LE after a traumatic fracture R patella 5 months ago.  Again focused today on further stretching her R knee into flexion and  additional strengthening and balance with varying weight bearing surfaces.  She tolerated all activities well.  Building muscle mass R quads. Continues to benefit from skilled PT to recover.  OBJECTIVE IMPAIRMENTS: Abnormal gait, decreased activity tolerance, decreased endurance, decreased mobility, difficulty walking, decreased ROM, decreased strength, increased fascial restrictions, improper body mechanics, and pain.   ACTIVITY LIMITATIONS: carrying, lifting, bending, sitting, squatting, and locomotion level  PARTICIPATION LIMITATIONS: meal prep, cleaning, laundry, driving, shopping, community activity, and yard work  PERSONAL FACTORS: Age, Time since onset of injury/illness/exacerbation, and 1 comorbidity: HTN  are also affecting patient's functional outcome.   REHAB POTENTIAL: Good  CLINICAL DECISION MAKING: Stable/uncomplicated  EVALUATION COMPLEXITY: Low   GOALS: Goals reviewed with patient? Yes  SHORT TERM GOALS: Target date: 12/29/22 I HEP Baseline:established today Goal status: IN PROGRESS  2.  Improve R knee ROM to 0- 90 Baseline: -6 to 80 Goal status: IN PROGRESS 01/03/23:  flexion to 84 today 01/10/23:  flexion to 88 today 01/19/23 flexion 92, goal met  LONG TERM GOALS: Target date: 03/09/23  Improve FGA from 11 to 28 Baseline: 11 Goal status: INITIAL 01/19/23: 25/28  2.  Foto 61 Baseline: 54 Goal status: INITIAL  3.  R quads strength 4+/5 for I transfers, gait efficiency, stairs, uneven terrain Baseline: 3-/5 Goal status: IN PROGRESS 01/19/23: 4+/5 goal met so far    PLAN:  PT FREQUENCY: 2x/week  PT DURATION: 12 weeks  PLANNED INTERVENTIONS: Therapeutic exercises, Therapeutic activity, Neuromuscular re-education, Balance training, Gait training, Patient/Family education, Self  Care, and Joint mobilization  PLAN FOR NEXT SESSION: progress with gentle stretching R LE, continue to address R LE strengthening open and closed chain   Grayce Sessions,  PTA 02/08/2023, 1:45 PM

## 2023-02-10 ENCOUNTER — Encounter: Payer: Self-pay | Admitting: Physical Therapy

## 2023-02-10 ENCOUNTER — Ambulatory Visit: Payer: Medicare PPO | Admitting: Physical Therapy

## 2023-02-10 DIAGNOSIS — M25661 Stiffness of right knee, not elsewhere classified: Secondary | ICD-10-CM | POA: Diagnosis not present

## 2023-02-10 DIAGNOSIS — R262 Difficulty in walking, not elsewhere classified: Secondary | ICD-10-CM | POA: Diagnosis not present

## 2023-02-10 DIAGNOSIS — S82041A Displaced comminuted fracture of right patella, initial encounter for closed fracture: Secondary | ICD-10-CM | POA: Diagnosis not present

## 2023-02-10 DIAGNOSIS — S76111D Strain of right quadriceps muscle, fascia and tendon, subsequent encounter: Secondary | ICD-10-CM

## 2023-02-10 NOTE — Therapy (Signed)
OUTPATIENT PHYSICAL THERAPY LOWER EXTREMITY TREATMENT     Patient Name: Danielle Yoder MRN: [id number] DOB:1946/03/30, 77 y.o., female Today's Date: 02/10/2023  END OF SESSION:  PT End of Session - 02/10/23 1100     Visit Number 16    Date for PT Re-Evaluation 03/09/23    PT Start Time 1100    PT Stop Time 1145    PT Time Calculation (min) 45 min    Activity Tolerance Patient tolerated treatment well    Behavior During Therapy WFL for tasks assessed/performed                  Past Medical History:  Diagnosis Date   Allergy    RHINITIS   Arthritis    Colonic polyp    Dyslipidemia    Hemorrhoids    Hypertension    Past Surgical History:  Procedure Laterality Date   FOOT SURGERY Right    bone shaved off; 2 surgeries (recurred 8 yrs later)   FOOT SURGERY Left    bone shaved off ; 2 surgeries (recurred)   ORIF PATELLA Right 09/08/2022   Procedure: OPEN REDUCTION INTERNAL FIXATION (ORIF) PATELLA;  Surgeon: London Sheer, MD;  Location: [facility];  Service: Orthopedics;  Laterality: Right;   Patient Active Problem List   Diagnosis Date Noted   Patellar fracture 09/08/2022   Unspecified fracture of right patella, initial encounter for closed fracture 09/07/2022   Osteopenia 02/17/2018   Family history of heart disease in female family member before age 36 05/31/2011   Arthritis 05/31/2011   Hyperlipidemia with target LDL less than 100 05/31/2011   Hypertension 05/31/2011   Allergic rhinitis due to pollen 05/31/2011   History of colonic polyps 05/31/2011    PCP: Sharlot Gowda, MD  REFERRING PROVIDER: Willia Craze, MD  REFERRING DIAG: Closed displaced comminuted fracture of right patella with routine healing, subsequent encounter, partial patellectomy.  THERAPY DIAG:  Displaced comminuted fracture of right patella, initial encounter for closed fracture  Stiffness of right knee, not elsewhere classified  Difficulty in walking, not elsewhere  classified  Strain of right quadriceps muscle, fascia and tendon, subsequent encounter  Rationale for Evaluation and Treatment: Rehabilitation  ONSET DATE: 09/08/22  SUBJECTIVE:   SUBJECTIVE STATEMENT: "Im ok"  PERTINENT HISTORY: Fell at home on corner of air mattress at [employer], landed on R knee fractured R patella, underwent surgery to debride and repair.  Has undergone home health PT, now referred to outpatient PT to continue rehab PAIN:  Are you having pain? Yes: NPRS scale: 0/10 Pain location: R ant knee Pain description: occasional twinges Aggravating factors: certain movements Relieving factors: resting  PRECAUTIONS: Other: from MD referral; Work on Right knee active range of motion and quad strengtheningWork on Right knee active range of motion and quad strengthening  WEIGHT BEARING RESTRICTIONS: No  FALLS:  Has patient fallen in last 6 months? Yes. Number of falls 1  LIVING ENVIRONMENT: Lives with: lives with their family and lives with their spouse Lives in: House/apartment Stairs: Yes: Internal: 11 steps; on right going up Has following equipment at home: Single point cane and Walker - 2 wheeled  OCCUPATION: retired  PLOF: Independent  PATIENT GOALS: return to I lifestyle  NEXT MD VISIT: December 22 2022  OBJECTIVE:   DIAGNOSTIC FINDINGS: na  PATIENT SURVEYS:  FOTO 76  COGNITION: Overall cognitive status: Within functional limits for tasks assessed     SENSATION: WFL  EDEMA:  None noted POSTURE: weight shift left  PALPATION: Thickened R quads, distally especially at patellar attachments, patella with good multidirectional mobility  LOWER EXTREMITY ROM:all wnl except R knee flexion in sitting 79, flexion supine to 70 Extension in supine -7 01/03/23:  R knee flexion in sitting 84, supine to 80, R knee extension(supine R heel on roll) 0 01/10/23: R knee flexion 88 01/19/23: 93 flexion 01/31/23:  R knee flex 98 degrees  LOWER EXTREMITY MMT: R  quads with 20 degree lag for LAQ, 01/19/23: 7 degree lag, MMT 4+/5 R SLR with 10 degree lag 01/19/23: SLR with 3 degree lag R hamstring 4-, 01/19/23:  R hamstring 5/5 Hip extension, abduction wnl R plantarflexors wnl     FUNCTIONAL TESTS:  Functional gait assessment: 11       GAIT: Distance walked: within department Assistive device utilized: Single point cane Level of assistance: SBA Comments: using knee brace which was sitting on top of R foot and hindering, circumduction R LE noted   TODAY'S TREATMENT:                                                                                                                              DATE:  02/10/23 NuStep L5 x 6 min LE only Bike Partial Revs x2 min  R knee PROM with end range holds LAQ 3lb RLE 2x10 Forward & Lateral step ups 4in box on airex x 10 Leg press, 40#  2x12 reps, RLE 20lb 2x12  Heel raises 2x12 HS curls 25lb 2x10   02/08/23 NuStep L5 x 6 min LE only R knee PROM with end range holds S2S on airex holding yellow ball 2x12 Leg press, 40#  2x12 reps Forward & Lateral step ups 4in box on airex x 10 Heel raises black bar 2x12 HS curls 25lb 2x10 Leg Ext 10lb 2x10     02/02/23: Therapeutic exercise: Nustep level 4, Ue's and LE's 7 min Leg press, 40#, 2 x 10 reps Leg press R LE only 20#, 2 x 10 reps. Gait outdoors for 6 min, down/up 2 curbs, on inclined asphalt.   In ll bars for lunges on compliant side of BOSU, mass practice each leg, needed more guarding with L foot on the BOSU In ll bars for static standing on smooth side BOSU, also mini squats, all with CGA from PT Standing on BOSU in ll bars for side to side rocking, B UE support Seated long arc quads 5#, 10 x 2 Seated deep cross friction massage R distal quads, then manual stretching into flexion to tolerance, 3 x with overpressure by therapist, 10 sec holds   01/31/23: Nustep LE's 6 min Leg press 40# B 20x 20# R 20x Seated for stretch R knee into flexion, with  overpressure from PT, and intervals of cross friction massage distal R quads and inferior patellar glides., measured R knee flexion 98 today Lateral step ups to 4" step with rail, 20x Seated long arc quads with 5 #, 3 sets 10 Seated  hamstring curls with green band, 10 x 2 Standing for B heel raises, forefeet on black bar to stretch post chain, strengthen B plantarflexors and foot intrinsics     PATIENT EDUCATION:  Education details: POC, goals Person educated: Patient and Spouse Education method: Explanation, Demonstration, Tactile cues, Verbal cues, and Handouts Education comprehension: verbalized understanding, returned demonstration, verbal cues required, tactile cues required, and needs further education  HOME EXERCISE PROGRAM: Access Code: [email or web address] URL: [email or web address] Date: 01/03/2023 Prepared by: Linton Rump Speaks  Exercises - Supine Heel Slide with Strap  - 1 x daily - 7 x weekly - 3 sets - 10 reps - Supine Straight Leg Raises  - 1 x daily - 7 x weekly - 3 sets - 10 reps - Seated Long Arc Quad  - 1 x daily - 7 x weekly - 3 sets - 10 reps - Sit to Stand  - 1 x daily - 7 x weekly - 3 sets - 10 reps - Standing Heel Raise with Support  - 1 x daily - 7 x weekly - 3 sets - 10 reps - Side Step Down with Unilateral Counter Support  - 1 x daily - 7 x weekly - 3 sets - 10 reps  - Lower Quarter Reach Combination  - 1 x daily - 7 x weekly - 3 sets - 10 reps Access Code: [email or web address] URL: [email or web address] Date: 12/29/2022 Prepared by: Debroah Baller  Exercises - Supine Heel Slide with Strap  - 1 x daily - 7 x weekly - 3 sets - 10 reps - Supine Straight Leg Raises  - 1 x daily - 7 x weekly - 3 sets - 10 reps - Seated Long Arc Quad  - 1 x daily - 7 x weekly - 3 sets - 10 reps - Sit to Stand  - 1 x daily - 7 x weekly - 3 sets - 10 reps - Standing Heel Raise with Support  - 1 x daily - 7 x weekly - 3 sets - 10 reps ASSESSMENT:  CLINICAL  IMPRESSION: Patient is a 77 y.o. female who was seen today for physical therapy treatment for rehabilitation of function for R LE after a traumatic fracture R patella 5 months ago.  Again focused today on further stretching her R knee into flexion and additional strengthening and balance with varying weight bearing surfaces.  She tolerated all activities well, some assist needed with SL on leg press. Cues to hold contraction with LAQ. Continues to benefit from skilled PT to recover.  OBJECTIVE IMPAIRMENTS: Abnormal gait, decreased activity tolerance, decreased endurance, decreased mobility, difficulty walking, decreased ROM, decreased strength, increased fascial restrictions, improper body mechanics, and pain.   ACTIVITY LIMITATIONS: carrying, lifting, bending, sitting, squatting, and locomotion level  PARTICIPATION LIMITATIONS: meal prep, cleaning, laundry, driving, shopping, community activity, and yard work  PERSONAL FACTORS: Age, Time since onset of injury/illness/exacerbation, and 1 comorbidity: HTN  are also affecting patient's functional outcome.   REHAB POTENTIAL: Good  CLINICAL DECISION MAKING: Stable/uncomplicated  EVALUATION COMPLEXITY: Low   GOALS: Goals reviewed with patient? Yes  SHORT TERM GOALS: Target date: 12/29/22 I HEP Baseline:established today Goal status: IN PROGRESS  2.  Improve R knee ROM to 0- 90 Baseline: -6 to 80 Goal status: IN PROGRESS 01/03/23:  flexion to 84 today 01/10/23:  flexion to 88 today 01/19/23 flexion 92, goal met  LONG TERM GOALS: Target date: 03/09/23  Improve FGA from 11 to 28 Baseline: 11 Goal status: INITIAL 01/19/23: 25/28  2.  Foto 61  Baseline: 54 Goal status: INITIAL  3.  R quads strength 4+/5 for I transfers, gait efficiency, stairs, uneven terrain Baseline: 3-/5 Goal status: IN PROGRESS 01/19/23: 4+/5 goal met so far    PLAN:  PT FREQUENCY: 2x/week  PT DURATION: 12 weeks  PLANNED INTERVENTIONS: Therapeutic exercises,  Therapeutic activity, Neuromuscular re-education, Balance training, Gait training, Patient/Family education, Self Care, and Joint mobilization  PLAN FOR NEXT SESSION: progress with gentle stretching R LE, continue to address R LE strengthening open and closed chain   Grayce Sessions, PTA 02/10/2023, 11:01 AM

## 2023-02-14 ENCOUNTER — Ambulatory Visit: Payer: Medicare PPO | Admitting: Physical Therapy

## 2023-02-15 ENCOUNTER — Ambulatory Visit: Payer: Medicare PPO | Attending: Orthopedic Surgery | Admitting: Physical Therapy

## 2023-02-15 ENCOUNTER — Encounter: Payer: Self-pay | Admitting: Physical Therapy

## 2023-02-15 DIAGNOSIS — R262 Difficulty in walking, not elsewhere classified: Secondary | ICD-10-CM | POA: Insufficient documentation

## 2023-02-15 DIAGNOSIS — M25661 Stiffness of right knee, not elsewhere classified: Secondary | ICD-10-CM | POA: Diagnosis not present

## 2023-02-15 DIAGNOSIS — S76111D Strain of right quadriceps muscle, fascia and tendon, subsequent encounter: Secondary | ICD-10-CM | POA: Insufficient documentation

## 2023-02-15 DIAGNOSIS — S82041A Displaced comminuted fracture of right patella, initial encounter for closed fracture: Secondary | ICD-10-CM | POA: Diagnosis not present

## 2023-02-15 NOTE — Therapy (Signed)
OUTPATIENT PHYSICAL THERAPY LOWER EXTREMITY TREATMENT     Patient Name: Danielle Yoder MRN: [id number] DOB:Jan 30, 1946, 77 y.o., female Today's Date: 02/15/2023  END OF SESSION:  PT End of Session - 02/15/23 1304     Visit Number 17    Date for PT Re-Evaluation 03/09/23    PT Start Time 1300    PT Stop Time 1345    PT Time Calculation (min) 45 min    Activity Tolerance Patient tolerated treatment well    Behavior During Therapy WFL for tasks assessed/performed                  Past Medical History:  Diagnosis Date   Allergy    RHINITIS   Arthritis    Colonic polyp    Dyslipidemia    Hemorrhoids    Hypertension    Past Surgical History:  Procedure Laterality Date   FOOT SURGERY Right    bone shaved off; 2 surgeries (recurred 8 yrs later)   FOOT SURGERY Left    bone shaved off ; 2 surgeries (recurred)   ORIF PATELLA Right 09/08/2022   Procedure: OPEN REDUCTION INTERNAL FIXATION (ORIF) PATELLA;  Surgeon: London Sheer, MD;  Location: [facility];  Service: Orthopedics;  Laterality: Right;   Patient Active Problem List   Diagnosis Date Noted   Patellar fracture 09/08/2022   Unspecified fracture of right patella, initial encounter for closed fracture 09/07/2022   Osteopenia 02/17/2018   Family history of heart disease in female family member before age 37 05/31/2011   Arthritis 05/31/2011   Hyperlipidemia with target LDL less than 100 05/31/2011   Hypertension 05/31/2011   Allergic rhinitis due to pollen 05/31/2011   History of colonic polyps 05/31/2011    PCP: Sharlot Gowda, MD  REFERRING PROVIDER: Willia Craze, MD  REFERRING DIAG: Closed displaced comminuted fracture of right patella with routine healing, subsequent encounter, partial patellectomy.  THERAPY DIAG:  Displaced comminuted fracture of right patella, initial encounter for closed fracture  Difficulty in walking, not elsewhere classified  Strain of right quadriceps muscle, fascia and  tendon, subsequent encounter  Stiffness of right knee, not elsewhere classified  Rationale for Evaluation and Treatment: Rehabilitation  ONSET DATE: 09/08/22  SUBJECTIVE:   SUBJECTIVE STATEMENT: "Im ok"  PERTINENT HISTORY: Fell at home on corner of air mattress at [employer], landed on R knee fractured R patella, underwent surgery to debride and repair.  Has undergone home health PT, now referred to outpatient PT to continue rehab PAIN:  Are you having pain? Yes: NPRS scale: 0/10 Pain location: R ant knee Pain description: occasional twinges Aggravating factors: certain movements Relieving factors: resting  PRECAUTIONS: Other: from MD referral; Work on Right knee active range of motion and quad strengtheningWork on Right knee active range of motion and quad strengthening  WEIGHT BEARING RESTRICTIONS: No  FALLS:  Has patient fallen in last 6 months? Yes. Number of falls 1  LIVING ENVIRONMENT: Lives with: lives with their family and lives with their spouse Lives in: House/apartment Stairs: Yes: Internal: 11 steps; on right going up Has following equipment at home: Single point cane and Walker - 2 wheeled  OCCUPATION: retired  PLOF: Independent  PATIENT GOALS: return to I lifestyle  NEXT MD VISIT: December 22 2022  OBJECTIVE:   DIAGNOSTIC FINDINGS: na  PATIENT SURVEYS:  FOTO 73  COGNITION: Overall cognitive status: Within functional limits for tasks assessed     SENSATION: WFL  EDEMA:  None noted POSTURE: weight shift left  PALPATION: Thickened R quads, distally especially at patellar attachments, patella with good multidirectional mobility  LOWER EXTREMITY ROM:all wnl except R knee flexion in sitting 79, flexion supine to 70 Extension in supine -7 01/03/23:  R knee flexion in sitting 84, supine to 80, R knee extension(supine R heel on roll) 0 01/10/23: R knee flexion 88 01/19/23: 93 flexion 01/31/23:  R knee flex 98 degrees  LOWER EXTREMITY MMT: R quads  with 20 degree lag for LAQ, 01/19/23: 7 degree lag, MMT 4+/5 R SLR with 10 degree lag 01/19/23: SLR with 3 degree lag R hamstring 4-, 01/19/23:  R hamstring 5/5 Hip extension, abduction wnl R plantarflexors wnl     FUNCTIONAL TESTS:  Functional gait assessment: 11       GAIT: Distance walked: within department Assistive device utilized: Single point cane Level of assistance: SBA Comments: using knee brace which was sitting on top of R foot and hindering, circumduction R LE noted   TODAY'S TREATMENT:                                                                                                                              DATE:  02/15/23 NuStep L5 x 6 min LE only R knee PROM with end range holds S2S on airex holding blue ball 2x12 6in box on airex step ups from airex x10 each HS curls 25lb 2x12 Leg Ext 10lb 2x10 LAQ RLE 5lb 2x10 Heel raises black bar 2x15 Leg press, 40#  2x12 reps   02/10/23 NuStep L5 x 6 min LE only Bike Partial Revs x2 min  R knee PROM with end range holds LAQ 3lb RLE 2x10 Forward & Lateral step ups 4in box on airex x 10 Leg press, 40#  2x12 reps, RLE 20lb 2x12  Heel raises 2x12 HS curls 25lb 2x10   02/08/23 NuStep L5 x 6 min LE only R knee PROM with end range holds S2S on airex holding yellow ball 2x12 Leg press, 40#  2x12 reps Forward & Lateral step ups 4in box on airex x 10 Heel raises black bar 2x12 HS curls 25lb 2x10 Leg Ext 10lb 2x10     02/02/23: Therapeutic exercise: Nustep level 4, Ue's and LE's 7 min Leg press, 40#, 2 x 10 reps Leg press R LE only 20#, 2 x 10 reps. Gait outdoors for 6 min, down/up 2 curbs, on inclined asphalt.   In ll bars for lunges on compliant side of BOSU, mass practice each leg, needed more guarding with L foot on the BOSU In ll bars for static standing on smooth side BOSU, also mini squats, all with CGA from PT Standing on BOSU in ll bars for side to side rocking, B UE support Seated long arc quads 5#, 10 x  2 Seated deep cross friction massage R distal quads, then manual stretching into flexion to tolerance, 3 x with overpressure by therapist, 10 sec holds   01/31/23: Nustep LE's 6 min Leg  press 40# B 20x 20# R 20x Seated for stretch R knee into flexion, with overpressure from PT, and intervals of cross friction massage distal R quads and inferior patellar glides., measured R knee flexion 98 today Lateral step ups to 4" step with rail, 20x Seated long arc quads with 5 #, 3 sets 10 Seated hamstring curls with green band, 10 x 2 Standing for B heel raises, forefeet on black bar to stretch post chain, strengthen B plantarflexors and foot intrinsics     PATIENT EDUCATION:  Education details: POC, goals Person educated: Patient and Spouse Education method: Explanation, Demonstration, Tactile cues, Verbal cues, and Handouts Education comprehension: verbalized understanding, returned demonstration, verbal cues required, tactile cues required, and needs further education  HOME EXERCISE PROGRAM: Access Code: [email or web address] URL: [email or web address] Date: 01/03/2023 Prepared by: Linton Rump Speaks  Exercises - Supine Heel Slide with Strap  - 1 x daily - 7 x weekly - 3 sets - 10 reps - Supine Straight Leg Raises  - 1 x daily - 7 x weekly - 3 sets - 10 reps - Seated Long Arc Quad  - 1 x daily - 7 x weekly - 3 sets - 10 reps - Sit to Stand  - 1 x daily - 7 x weekly - 3 sets - 10 reps - Standing Heel Raise with Support  - 1 x daily - 7 x weekly - 3 sets - 10 reps - Side Step Down with Unilateral Counter Support  - 1 x daily - 7 x weekly - 3 sets - 10 reps  - Lower Quarter Reach Combination  - 1 x daily - 7 x weekly - 3 sets - 10 reps Access Code: [email or web address] URL: [email or web address] Date: 12/29/2022 Prepared by: Debroah Baller  Exercises - Supine Heel Slide with Strap  - 1 x daily - 7 x weekly - 3 sets - 10 reps - Supine Straight Leg Raises  - 1 x daily - 7 x weekly - 3  sets - 10 reps - Seated Long Arc Quad  - 1 x daily - 7 x weekly - 3 sets - 10 reps - Sit to Stand  - 1 x daily - 7 x weekly - 3 sets - 10 reps - Standing Heel Raise with Support  - 1 x daily - 7 x weekly - 3 sets - 10 reps ASSESSMENT:  CLINICAL IMPRESSION: Patient is a 77 y.o. female who was seen today for physical therapy treatment for rehabilitation of function for R LE after a traumatic fracture R patella 5 months ago.  Again focused today on further stretching her R knee into flexion strengthening. Cues to hold contraction with LAQ. Some compensation present with sit to stands.Continues to benefit from skilled PT to recover.  OBJECTIVE IMPAIRMENTS: Abnormal gait, decreased activity tolerance, decreased endurance, decreased mobility, difficulty walking, decreased ROM, decreased strength, increased fascial restrictions, improper body mechanics, and pain.   ACTIVITY LIMITATIONS: carrying, lifting, bending, sitting, squatting, and locomotion level  PARTICIPATION LIMITATIONS: meal prep, cleaning, laundry, driving, shopping, community activity, and yard work  PERSONAL FACTORS: Age, Time since onset of injury/illness/exacerbation, and 1 comorbidity: HTN  are also affecting patient's functional outcome.   REHAB POTENTIAL: Good  CLINICAL DECISION MAKING: Stable/uncomplicated  EVALUATION COMPLEXITY: Low   GOALS: Goals reviewed with patient? Yes  SHORT TERM GOALS: Target date: 12/29/22 I HEP Baseline:established today Goal status: IN PROGRESS  2.  Improve R knee ROM to 0- 90 Baseline: -6 to 80 Goal status: IN PROGRESS 01/03/23:  flexion  to 84 today 01/10/23:  flexion to 88 today 01/19/23 flexion 92, goal met  LONG TERM GOALS: Target date: 03/09/23  Improve FGA from 11 to 28 Baseline: 11 Goal status: INITIAL 01/19/23: 25/28  2.  Foto 61 Baseline: 54 Goal status: INITIAL  3.  R quads strength 4+/5 for I transfers, gait efficiency, stairs, uneven terrain Baseline: 3-/5 Goal status: IN  PROGRESS 01/19/23: 4+/5 goal met so far    PLAN:  PT FREQUENCY: 2x/week  PT DURATION: 12 weeks  PLANNED INTERVENTIONS: Therapeutic exercises, Therapeutic activity, Neuromuscular re-education, Balance training, Gait training, Patient/Family education, Self Care, and Joint mobilization  PLAN FOR NEXT SESSION: progress with gentle stretching R LE, continue to address R LE strengthening open and closed chain   Grayce Sessions, PTA 02/15/2023, 1:04 PM

## 2023-02-16 ENCOUNTER — Ambulatory Visit: Payer: Medicare PPO | Admitting: Physical Therapy

## 2023-02-17 ENCOUNTER — Encounter: Payer: Self-pay | Admitting: Physical Therapy

## 2023-02-17 ENCOUNTER — Ambulatory Visit: Payer: Medicare PPO | Admitting: Physical Therapy

## 2023-02-17 DIAGNOSIS — S76111D Strain of right quadriceps muscle, fascia and tendon, subsequent encounter: Secondary | ICD-10-CM | POA: Diagnosis not present

## 2023-02-17 DIAGNOSIS — S82041A Displaced comminuted fracture of right patella, initial encounter for closed fracture: Secondary | ICD-10-CM

## 2023-02-17 DIAGNOSIS — R262 Difficulty in walking, not elsewhere classified: Secondary | ICD-10-CM | POA: Diagnosis not present

## 2023-02-17 DIAGNOSIS — M25661 Stiffness of right knee, not elsewhere classified: Secondary | ICD-10-CM | POA: Diagnosis not present

## 2023-02-17 NOTE — Therapy (Signed)
OUTPATIENT PHYSICAL THERAPY LOWER EXTREMITY TREATMENT     Patient Name: Tangela Uccello MRN: [id number] DOB:17-Nov-1945, 77 y.o., female Today's Date: 02/17/2023  END OF SESSION:  PT End of Session - 02/17/23 1015     Visit Number 18    Date for PT Re-Evaluation 03/09/23    PT Start Time 1015    PT Stop Time 1100    PT Time Calculation (min) 45 min    Activity Tolerance Patient tolerated treatment well                  Past Medical History:  Diagnosis Date   Allergy    RHINITIS   Arthritis    Colonic polyp    Dyslipidemia    Hemorrhoids    Hypertension    Past Surgical History:  Procedure Laterality Date   FOOT SURGERY Right    bone shaved off; 2 surgeries (recurred 8 yrs later)   FOOT SURGERY Left    bone shaved off ; 2 surgeries (recurred)   ORIF PATELLA Right 09/08/2022   Procedure: OPEN REDUCTION INTERNAL FIXATION (ORIF) PATELLA;  Surgeon: London Sheer, MD;  Location: [facility];  Service: Orthopedics;  Laterality: Right;   Patient Active Problem List   Diagnosis Date Noted   Patellar fracture 09/08/2022   Unspecified fracture of right patella, initial encounter for closed fracture 09/07/2022   Osteopenia 02/17/2018   Family history of heart disease in female family member before age 36 05/31/2011   Arthritis 05/31/2011   Hyperlipidemia with target LDL less than 100 05/31/2011   Hypertension 05/31/2011   Allergic rhinitis due to pollen 05/31/2011   History of colonic polyps 05/31/2011    PCP: Sharlot Gowda, MD  REFERRING PROVIDER: Willia Craze, MD  REFERRING DIAG: Closed displaced comminuted fracture of right patella with routine healing, subsequent encounter, partial patellectomy.  THERAPY DIAG:  Displaced comminuted fracture of right patella, initial encounter for closed fracture  Difficulty in walking, not elsewhere classified  Strain of right quadriceps muscle, fascia and tendon, subsequent encounter  Rationale for Evaluation and  Treatment: Rehabilitation  ONSET DATE: 09/08/22  SUBJECTIVE:   SUBJECTIVE STATEMENT: "I am ok"  PERTINENT HISTORY: Fell at home on corner of air mattress at [employer], landed on R knee fractured R patella, underwent surgery to debride and repair.  Has undergone home health PT, now referred to outpatient PT to continue rehab PAIN:  Are you having pain? Yes: NPRS scale: 0/10 Pain location: R ant knee Pain description: occasional twinges Aggravating factors: certain movements Relieving factors: resting  PRECAUTIONS: Other: from MD referral; Work on Right knee active range of motion and quad strengtheningWork on Right knee active range of motion and quad strengthening  WEIGHT BEARING RESTRICTIONS: No  FALLS:  Has patient fallen in last 6 months? Yes. Number of falls 1  LIVING ENVIRONMENT: Lives with: lives with their family and lives with their spouse Lives in: House/apartment Stairs: Yes: Internal: 11 steps; on right going up Has following equipment at home: Single point cane and Walker - 2 wheeled  OCCUPATION: retired  PLOF: Independent  PATIENT GOALS: return to I lifestyle  NEXT MD VISIT: December 22 2022  OBJECTIVE:   DIAGNOSTIC FINDINGS: na  PATIENT SURVEYS:  FOTO 17  COGNITION: Overall cognitive status: Within functional limits for tasks assessed     SENSATION: WFL  EDEMA:  None noted POSTURE: weight shift left  PALPATION: Thickened R quads, distally especially at patellar attachments, patella with good multidirectional mobility  LOWER EXTREMITY  ROM:all wnl except R knee flexion in sitting 79, flexion supine to 70 Extension in supine -7 01/03/23:  R knee flexion in sitting 84, supine to 80, R knee extension(supine R heel on roll) 0 01/10/23: R knee flexion 88 01/19/23: 93 flexion 01/31/23:  R knee flex 98 degrees  LOWER EXTREMITY MMT: R quads with 20 degree lag for LAQ, 01/19/23: 7 degree lag, MMT 4+/5 R SLR with 10 degree lag 01/19/23: SLR with 3 degree  lag R hamstring 4-, 01/19/23:  R hamstring 5/5 Hip extension, abduction wnl R plantarflexors wnl     FUNCTIONAL TESTS:  Functional gait assessment: 11       GAIT: Distance walked: within department Assistive device utilized: Single point cane Level of assistance: SBA Comments: using knee brace which was sitting on top of R foot and hindering, circumduction R LE noted   TODAY'S TREATMENT:                                                                                                                              DATE:  02/17/23 NuStep L5 x 6 min LE only Gait to back building 2 flights of stairs alternating pattern  R knee AROM 1-96 deg R knee PROM 105 Forward & Lateral step ups 4in box on airex x 10 Heel raises black bar 2x15 HS curls 20lb 2x10, RLE  Led Ext 10lb 2x10   02/15/23 NuStep L5 x 6 min LE only R knee PROM with end range holds S2S on airex holding blue ball 2x12 6in box on airex step ups from airex x10 each HS curls 25lb 2x12 Leg Ext 10lb 2x10 LAQ RLE 5lb 2x10 Heel raises black bar 2x15 Leg press, 40#  2x12 reps   02/10/23 NuStep L5 x 6 min LE only Bike Partial Revs x2 min  R knee PROM with end range holds LAQ 3lb RLE 2x10 Forward & Lateral step ups 4in box on airex x 10 Leg press, 40#  2x12 reps, RLE 20lb 2x12  Heel raises 2x12 HS curls 25lb 2x10   02/08/23 NuStep L5 x 6 min LE only R knee PROM with end range holds S2S on airex holding yellow ball 2x12 Leg press, 40#  2x12 reps Forward & Lateral step ups 4in box on airex x 10 Heel raises black bar 2x12 HS curls 25lb 2x10 Leg Ext 10lb 2x10     02/02/23: Therapeutic exercise: Nustep level 4, Ue's and LE's 7 min Leg press, 40#, 2 x 10 reps Leg press R LE only 20#, 2 x 10 reps. Gait outdoors for 6 min, down/up 2 curbs, on inclined asphalt.   In ll bars for lunges on compliant side of BOSU, mass practice each leg, needed more guarding with L foot on the BOSU In ll bars for static standing on smooth  side BOSU, also mini squats, all with CGA from PT Standing on BOSU in ll bars for side to side rocking, B UE support Seated  long arc quads 5#, 10 x 2 Seated deep cross friction massage R distal quads, then manual stretching into flexion to tolerance, 3 x with overpressure by therapist, 10 sec holds   01/31/23: Nustep LE's 6 min Leg press 40# B 20x 20# R 20x Seated for stretch R knee into flexion, with overpressure from PT, and intervals of cross friction massage distal R quads and inferior patellar glides., measured R knee flexion 98 today Lateral step ups to 4" step with rail, 20x Seated long arc quads with 5 #, 3 sets 10 Seated hamstring curls with green band, 10 x 2 Standing for B heel raises, forefeet on black bar to stretch post chain, strengthen B plantarflexors and foot intrinsics     PATIENT EDUCATION:  Education details: POC, goals Person educated: Patient and Spouse Education method: Explanation, Demonstration, Tactile cues, Verbal cues, and Handouts Education comprehension: verbalized understanding, returned demonstration, verbal cues required, tactile cues required, and needs further education  HOME EXERCISE PROGRAM: Access Code: [email or web address] URL: [email or web address] Date: 01/03/2023 Prepared by: Linton Rump Speaks  Exercises - Supine Heel Slide with Strap  - 1 x daily - 7 x weekly - 3 sets - 10 reps - Supine Straight Leg Raises  - 1 x daily - 7 x weekly - 3 sets - 10 reps - Seated Long Arc Quad  - 1 x daily - 7 x weekly - 3 sets - 10 reps - Sit to Stand  - 1 x daily - 7 x weekly - 3 sets - 10 reps - Standing Heel Raise with Support  - 1 x daily - 7 x weekly - 3 sets - 10 reps - Side Step Down with Unilateral Counter Support  - 1 x daily - 7 x weekly - 3 sets - 10 reps  - Lower Quarter Reach Combination  - 1 x daily - 7 x weekly - 3 sets - 10 reps Access Code: [email or web address] URL: [email or web address] Date: 12/29/2022 Prepared by: Debroah Baller  Exercises - Supine Heel Slide with Strap  - 1 x daily - 7 x weekly - 3 sets - 10 reps - Supine Straight Leg Raises  - 1 x daily - 7 x weekly - 3 sets - 10 reps - Seated Long Arc Quad  - 1 x daily - 7 x weekly - 3 sets - 10 reps - Sit to Stand  - 1 x daily - 7 x weekly - 3 sets - 10 reps - Standing Heel Raise with Support  - 1 x daily - 7 x weekly - 3 sets - 10 reps ASSESSMENT:  CLINICAL IMPRESSION: Patient is a 77 y.o. female who was seen today for physical therapy treatment for rehabilitation of function for R LE after a traumatic fracture R patella 6 months ago.  Again focused today on further stretching her R knee into flexion strengthening. She has progressed increasing her L knee acive and passive ROM. Cues needed at times not to ambulate with stiff RLE. Cues to hold contraction with extentions. Cues for sequencing needed with stair training to maintain alt pattern. Pt reports she has a step to pattern at home. Continues to benefit from skilled PT to recover.  OBJECTIVE IMPAIRMENTS: Abnormal gait, decreased activity tolerance, decreased endurance, decreased mobility, difficulty walking, decreased ROM, decreased strength, increased fascial restrictions, improper body mechanics, and pain.   ACTIVITY LIMITATIONS: carrying, lifting, bending, sitting, squatting, and locomotion level  PARTICIPATION LIMITATIONS: meal prep, cleaning, laundry, driving, shopping, community activity, and yard work  PERSONAL FACTORS:  Age, Time since onset of injury/illness/exacerbation, and 1 comorbidity: HTN  are also affecting patient's functional outcome.   REHAB POTENTIAL: Good  CLINICAL DECISION MAKING: Stable/uncomplicated  EVALUATION COMPLEXITY: Low   GOALS: Goals reviewed with patient? Yes  SHORT TERM GOALS: Target date: 12/29/22 I HEP Baseline:established today Goal status: IN PROGRESS  2.  Improve R knee ROM to 0- 90 Baseline: -6 to 80 Goal status: IN PROGRESS 01/03/23:  flexion to  84 today 01/10/23:  flexion to 88 today 01/19/23 flexion 92, goal met  LONG TERM GOALS: Target date: 03/09/23  Improve FGA from 11 to 28 Baseline: 11 Goal status: INITIAL 01/19/23: 25/28  2.  Foto 61 Baseline: 54 Goal status: INITIAL  3.  R quads strength 4+/5 for I transfers, gait efficiency, stairs, uneven terrain Baseline: 3-/5 Goal status: IN PROGRESS 01/19/23: 4+/5 goal met so far    PLAN:  PT FREQUENCY: 2x/week  PT DURATION: 12 weeks  PLANNED INTERVENTIONS: Therapeutic exercises, Therapeutic activity, Neuromuscular re-education, Balance training, Gait training, Patient/Family education, Self Care, and Joint mobilization  PLAN FOR NEXT SESSION: progress with gentle stretching R LE, continue to address R LE strengthening open and closed chain   Grayce Sessions, PTA 02/17/2023, 10:15 AM

## 2023-02-21 ENCOUNTER — Ambulatory Visit: Payer: Medicare PPO

## 2023-02-21 ENCOUNTER — Other Ambulatory Visit: Payer: Self-pay

## 2023-02-21 DIAGNOSIS — R262 Difficulty in walking, not elsewhere classified: Secondary | ICD-10-CM | POA: Diagnosis not present

## 2023-02-21 DIAGNOSIS — M25661 Stiffness of right knee, not elsewhere classified: Secondary | ICD-10-CM | POA: Diagnosis not present

## 2023-02-21 DIAGNOSIS — S76111D Strain of right quadriceps muscle, fascia and tendon, subsequent encounter: Secondary | ICD-10-CM | POA: Diagnosis not present

## 2023-02-21 DIAGNOSIS — S82041A Displaced comminuted fracture of right patella, initial encounter for closed fracture: Secondary | ICD-10-CM

## 2023-02-21 NOTE — Therapy (Signed)
OUTPATIENT PHYSICAL THERAPY LOWER EXTREMITY TREATMENT     Patient Name: Danielle Yoder MRN: [id number] DOB:Jan 24, 1946, 77 y.o., female Today's Date: 02/21/2023  END OF SESSION:  PT End of Session - 02/21/23 1259     Visit Number 19    Date for PT Re-Evaluation 03/09/23    Progress Note Due on Visit 20    PT Start Time 1300    PT Stop Time 1345    PT Time Calculation (min) 45 min                  Past Medical History:  Diagnosis Date   Allergy    RHINITIS   Arthritis    Colonic polyp    Dyslipidemia    Hemorrhoids    Hypertension    Past Surgical History:  Procedure Laterality Date   FOOT SURGERY Right    bone shaved off; 2 surgeries (recurred 8 yrs later)   FOOT SURGERY Left    bone shaved off ; 2 surgeries (recurred)   ORIF PATELLA Right 09/08/2022   Procedure: OPEN REDUCTION INTERNAL FIXATION (ORIF) PATELLA;  Surgeon: London Sheer, MD;  Location: [facility];  Service: Orthopedics;  Laterality: Right;   Patient Active Problem List   Diagnosis Date Noted   Patellar fracture 09/08/2022   Unspecified fracture of right patella, initial encounter for closed fracture 09/07/2022   Osteopenia 02/17/2018   Family history of heart disease in female family member before age 42 05/31/2011   Arthritis 05/31/2011   Hyperlipidemia with target LDL less than 100 05/31/2011   Hypertension 05/31/2011   Allergic rhinitis due to pollen 05/31/2011   History of colonic polyps 05/31/2011    PCP: Sharlot Gowda, MD  REFERRING PROVIDER: Willia Craze, MD  REFERRING DIAG: Closed displaced comminuted fracture of right patella with routine healing, subsequent encounter, partial patellectomy.  THERAPY DIAG:  Displaced comminuted fracture of right patella, initial encounter for closed fracture  Difficulty in walking, not elsewhere classified  Strain of right quadriceps muscle, fascia and tendon, subsequent encounter  Stiffness of right knee, not elsewhere  classified  Rationale for Evaluation and Treatment: Rehabilitation  ONSET DATE: 09/08/22  SUBJECTIVE:   SUBJECTIVE STATEMENT: "I feel good, still stiff in am .  Better descending hills."  PERTINENT HISTORY: Fell at home on corner of air mattress at [employer], landed on R knee fractured R patella, underwent surgery to debride and repair.  Has undergone home health PT, now referred to outpatient PT to continue rehab PAIN:  Are you having pain? Yes: NPRS scale: 0/10 Pain location: R ant knee Pain description: occasional twinges Aggravating factors: certain movements Relieving factors: resting  PRECAUTIONS: Other: from MD referral; Work on Right knee active range of motion and quad strengtheningWork on Right knee active range of motion and quad strengthening  WEIGHT BEARING RESTRICTIONS: No  FALLS:  Has patient fallen in last 6 months? Yes. Number of falls 1  LIVING ENVIRONMENT: Lives with: lives with their family and lives with their spouse Lives in: House/apartment Stairs: Yes: Internal: 11 steps; on right going up Has following equipment at home: Single point cane and Walker - 2 wheeled  OCCUPATION: retired  PLOF: Independent  PATIENT GOALS: return to I lifestyle  NEXT MD VISIT: December 22 2022  OBJECTIVE:   DIAGNOSTIC FINDINGS: na  PATIENT SURVEYS:  FOTO 30  COGNITION: Overall cognitive status: Within functional limits for tasks assessed     SENSATION: WFL  EDEMA:  None noted POSTURE: weight shift left  PALPATION: Thickened R quads, distally especially at patellar attachments, patella with good multidirectional mobility  LOWER EXTREMITY ROM:all wnl except R knee flexion in sitting 79, flexion supine to 70 Extension in supine -7 01/03/23:  R knee flexion in sitting 84, supine to 80, R knee extension(supine R heel on roll) 0 01/10/23: R knee flexion 88 01/19/23: 93 flexion 01/31/23:  R knee flex 98 degrees  LOWER EXTREMITY MMT: R quads with 20 degree lag  for LAQ, 01/19/23: 7 degree lag, MMT 4+/5 R SLR with 10 degree lag 01/19/23: SLR with 3 degree lag R hamstring 4-, 01/19/23:  R hamstring 5/5 Hip extension, abduction wnl R plantarflexors wnl     FUNCTIONAL TESTS:  Functional gait assessment: 11     GAIT: Distance walked: within department Assistive device utilized: Single point cane Level of assistance: SBA Comments: using knee brace which was sitting on top of R foot and hindering, circumduction R LE noted   TODAY'S TREATMENT:                                                                                                                              DATE:  02/21/23:  Nustep level 6, 6 min, UE's and LE's B knee ext 10#, 2 x 10, eccentric lowering R LE  Hamstring curls R LE 20#, 2 x 10 reps Forward heel taps L from 2" block mass practice, progressed from B Ue support to R Ue to none BOSU lunges with R Le on compliant side 10 reps Bosu smooth side, static standing, rocking side to side, mini squats Gait outdoors on asphalt 300' various inclines Toe raises on black bar, 15 x 2 Leg press B Le's 40# 2 x 10 R LE only 20#, 2 x 10\ Measured r knee flexion 104    02/17/23 NuStep L5 x 6 min LE only Gait to back building 2 flights of stairs alternating pattern  R knee AROM 1-96 deg R knee PROM 105 Forward & Lateral step ups 4in box on airex x 10 Heel raises black bar 2x15 HS curls 20lb 2x10, RLE  Led Ext 10lb 2x10   02/15/23 NuStep L5 x 6 min LE only R knee PROM with end range holds S2S on airex holding blue ball 2x12 6in box on airex step ups from airex x10 each HS curls 25lb 2x12 Leg Ext 10lb 2x10 LAQ RLE 5lb 2x10 Heel raises black bar 2x15 Leg press, 40#  2x12 reps   02/10/23 NuStep L5 x 6 min LE only Bike Partial Revs x2 min  R knee PROM with end range holds LAQ 3lb RLE 2x10 Forward & Lateral step ups 4in box on airex x 10 Leg press, 40#  2x12 reps, RLE 20lb 2x12  Heel raises 2x12 HS curls 25lb  2x10   02/08/23 NuStep L5 x 6 min LE only R knee PROM with end range holds S2S on airex holding yellow ball 2x12 Leg press, 40#  2x12 reps Forward &  Lateral step ups 4in box on airex x 10 Heel raises black bar 2x12 HS curls 25lb 2x10 Leg Ext 10lb 2x10     02/02/23: Therapeutic exercise: Nustep level 4, Ue's and LE's 7 min Leg press, 40#, 2 x 10 reps Leg press R LE only 20#, 2 x 10 reps. Gait outdoors for 6 min, down/up 2 curbs, on inclined asphalt.   In ll bars for lunges on compliant side of BOSU, mass practice each leg, needed more guarding with L foot on the BOSU In ll bars for static standing on smooth side BOSU, also mini squats, all with CGA from PT Standing on BOSU in ll bars for side to side rocking, B UE support Seated long arc quads 5#, 10 x 2 Seated deep cross friction massage R distal quads, then manual stretching into flexion to tolerance, 3 x with overpressure by therapist, 10 sec holds   01/31/23: Nustep LE's 6 min Leg press 40# B 20x 20# R 20x Seated for stretch R knee into flexion, with overpressure from PT, and intervals of cross friction massage distal R quads and inferior patellar glides., measured R knee flexion 98 today Lateral step ups to 4" step with rail, 20x Seated long arc quads with 5 #, 3 sets 10 Seated hamstring curls with green band, 10 x 2 Standing for B heel raises, forefeet on black bar to stretch post chain, strengthen B plantarflexors and foot intrinsics     PATIENT EDUCATION:  Education details: POC, goals Person educated: Patient and Spouse Education method: Explanation, Demonstration, Tactile cues, Verbal cues, and Handouts Education comprehension: verbalized understanding, returned demonstration, verbal cues required, tactile cues required, and needs further education  HOME EXERCISE PROGRAM: Access Code: [email or web address] URL: [email or web address] Date: 01/03/2023 Prepared by: Linton Rump Mikayela Deats  Exercises - Supine Heel Slide  with Strap  - 1 x daily - 7 x weekly - 3 sets - 10 reps - Supine Straight Leg Raises  - 1 x daily - 7 x weekly - 3 sets - 10 reps - Seated Long Arc Quad  - 1 x daily - 7 x weekly - 3 sets - 10 reps - Sit to Stand  - 1 x daily - 7 x weekly - 3 sets - 10 reps - Standing Heel Raise with Support  - 1 x daily - 7 x weekly - 3 sets - 10 reps - Side Step Down with Unilateral Counter Support  - 1 x daily - 7 x weekly - 3 sets - 10 reps  - Lower Quarter Reach Combination  - 1 x daily - 7 x weekly - 3 sets - 10 reps Access Code: [email or web address] URL: [email or web address] Date: 12/29/2022 Prepared by: Debroah Baller  Exercises - Supine Heel Slide with Strap  - 1 x daily - 7 x weekly - 3 sets - 10 reps - Supine Straight Leg Raises  - 1 x daily - 7 x weekly - 3 sets - 10 reps - Seated Long Arc Quad  - 1 x daily - 7 x weekly - 3 sets - 10 reps - Sit to Stand  - 1 x daily - 7 x weekly - 3 sets - 10 reps - Standing Heel Raise with Support  - 1 x daily - 7 x weekly - 3 sets - 10 reps ASSESSMENT:  CLINICAL IMPRESSION: Patient is a 77 y.o. female who was seen today for physical therapy treatment for rehabilitation of function for R LE after a traumatic fracture R patella 6 months ago.  Her stability and gait deficits which were present with descending inclines, teps is much better, occasionally has habitually locked R knee into extension.  Strength and balance progressing well.  Should be finishing with [place] next week. Continues to benefit from skilled PT to recover for the remaining 3 sessions.  OBJECTIVE IMPAIRMENTS: Abnormal gait, decreased activity tolerance, decreased endurance, decreased mobility, difficulty walking, decreased ROM, decreased strength, increased fascial restrictions, improper body mechanics, and pain.   ACTIVITY LIMITATIONS: carrying, lifting, bending, sitting, squatting, and locomotion level  PARTICIPATION LIMITATIONS: meal prep, cleaning, laundry, driving, shopping, community  activity, and yard work  PERSONAL FACTORS: Age, Time since onset of injury/illness/exacerbation, and 1 comorbidity: HTN  are also affecting patient's functional outcome.   REHAB POTENTIAL: Good  CLINICAL DECISION MAKING: Stable/uncomplicated  EVALUATION COMPLEXITY: Low   GOALS: Goals reviewed with patient? Yes  SHORT TERM GOALS: Target date: 12/29/22 I HEP Baseline:established today Goal status: IN PROGRESS  2.  Improve R knee ROM to 0- 90 Baseline: -6 to 80 Goal status: IN PROGRESS 01/03/23:  flexion to 84 today 01/10/23:  flexion to 88 today 01/19/23 flexion 92, goal met  LONG TERM GOALS: Target date: 03/09/23  Improve FGA from 11 to 28 Baseline: 11 Goal status: INITIAL 01/19/23: 25/28  2.  Foto 61 Baseline: 54 Goal status: INITIAL  3.  R quads strength 4+/5 for I transfers, gait efficiency, stairs, uneven terrain Baseline: 3-/5 Goal status: IN PROGRESS 01/19/23: 4+/5 goal met so far    PLAN:  PT FREQUENCY: 2x/week  PT DURATION: 12 weeks  PLANNED INTERVENTIONS: Therapeutic exercises, Therapeutic activity, Neuromuscular re-education, Balance training, Gait training, Patient/Family education, Self Care, and Joint mobilization  PLAN FOR NEXT SESSION: progress with gentle stretching R LE, continue to address R LE strengthening open and closed chain   Caralina Nop L Monic Engelmann, PT 02/21/2023, 1:48 PM

## 2023-02-23 ENCOUNTER — Encounter: Payer: Self-pay | Admitting: Physical Therapy

## 2023-02-23 ENCOUNTER — Ambulatory Visit: Payer: Medicare PPO | Admitting: Physical Therapy

## 2023-02-23 DIAGNOSIS — S82041A Displaced comminuted fracture of right patella, initial encounter for closed fracture: Secondary | ICD-10-CM | POA: Diagnosis not present

## 2023-02-23 DIAGNOSIS — M25661 Stiffness of right knee, not elsewhere classified: Secondary | ICD-10-CM | POA: Diagnosis not present

## 2023-02-23 DIAGNOSIS — R262 Difficulty in walking, not elsewhere classified: Secondary | ICD-10-CM | POA: Diagnosis not present

## 2023-02-23 DIAGNOSIS — S76111D Strain of right quadriceps muscle, fascia and tendon, subsequent encounter: Secondary | ICD-10-CM

## 2023-02-23 NOTE — Therapy (Signed)
OUTPATIENT PHYSICAL THERAPY LOWER EXTREMITY TREATMENT     Patient Name: Danielle Yoder MRN: [id number] DOB:1946-03-05, 77 y.o., female Today's Date: 02/23/2023  END OF SESSION:  PT End of Session - 02/23/23 1303     Visit Number 20    Date for PT Re-Evaluation 03/09/23    PT Start Time 1300    PT Stop Time 1345    PT Time Calculation (min) 45 min    Activity Tolerance Patient tolerated treatment well    Behavior During Therapy WFL for tasks assessed/performed                  Past Medical History:  Diagnosis Date   Allergy    RHINITIS   Arthritis    Colonic polyp    Dyslipidemia    Hemorrhoids    Hypertension    Past Surgical History:  Procedure Laterality Date   FOOT SURGERY Right    bone shaved off; 2 surgeries (recurred 8 yrs later)   FOOT SURGERY Left    bone shaved off ; 2 surgeries (recurred)   ORIF PATELLA Right 09/08/2022   Procedure: OPEN REDUCTION INTERNAL FIXATION (ORIF) PATELLA;  Surgeon: London Sheer, MD;  Location: [facility];  Service: Orthopedics;  Laterality: Right;   Patient Active Problem List   Diagnosis Date Noted   Patellar fracture 09/08/2022   Unspecified fracture of right patella, initial encounter for closed fracture 09/07/2022   Osteopenia 02/17/2018   Family history of heart disease in female family member before age 67 05/31/2011   Arthritis 05/31/2011   Hyperlipidemia with target LDL less than 100 05/31/2011   Hypertension 05/31/2011   Allergic rhinitis due to pollen 05/31/2011   History of colonic polyps 05/31/2011    PCP: Sharlot Gowda, MD  REFERRING PROVIDER: Willia Craze, MD  REFERRING DIAG: Closed displaced comminuted fracture of right patella with routine healing, subsequent encounter, partial patellectomy.  THERAPY DIAG:  Displaced comminuted fracture of right patella, initial encounter for closed fracture  Difficulty in walking, not elsewhere classified  Strain of right quadriceps muscle, fascia and  tendon, subsequent encounter  Stiffness of right knee, not elsewhere classified  Rationale for Evaluation and Treatment: Rehabilitation  ONSET DATE: 09/08/22  SUBJECTIVE:   SUBJECTIVE STATEMENT: "I am doing good"  PERTINENT HISTORY: Fell at home on corner of air mattress at [employer], landed on R knee fractured R patella, underwent surgery to debride and repair.  Has undergone home health PT, now referred to outpatient PT to continue rehab PAIN:  Are you having pain? Yes: NPRS scale: 0/10 Pain location: R ant knee Pain description: occasional twinges Aggravating factors: certain movements Relieving factors: resting  PRECAUTIONS: Other: from MD referral; Work on Right knee active range of motion and quad strengtheningWork on Right knee active range of motion and quad strengthening  WEIGHT BEARING RESTRICTIONS: No  FALLS:  Has patient fallen in last 6 months? Yes. Number of falls 1  LIVING ENVIRONMENT: Lives with: lives with their family and lives with their spouse Lives in: House/apartment Stairs: Yes: Internal: 11 steps; on right going up Has following equipment at home: Single point cane and Walker - 2 wheeled  OCCUPATION: retired  PLOF: Independent  PATIENT GOALS: return to I lifestyle  NEXT MD VISIT: December 22 2022  OBJECTIVE:   DIAGNOSTIC FINDINGS: na  PATIENT SURVEYS:  FOTO 19  COGNITION: Overall cognitive status: Within functional limits for tasks assessed     SENSATION: WFL  EDEMA:  None noted POSTURE: weight shift  left  PALPATION: Thickened R quads, distally especially at patellar attachments, patella with good multidirectional mobility  LOWER EXTREMITY ROM:all wnl except R knee flexion in sitting 79, flexion supine to 70 Extension in supine -7 01/03/23:  R knee flexion in sitting 84, supine to 80, R knee extension(supine R heel on roll) 0 01/10/23: R knee flexion 88 01/19/23: 93 flexion 01/31/23:  R knee flex 98 degrees  LOWER EXTREMITY MMT: R  quads with 20 degree lag for LAQ, 01/19/23: 7 degree lag, MMT 4+/5 R SLR with 10 degree lag 01/19/23: SLR with 3 degree lag R hamstring 4-, 01/19/23:  R hamstring 5/5 Hip extension, abduction wnl R plantarflexors wnl     FUNCTIONAL TESTS:  Functional gait assessment: 11     GAIT: Distance walked: within department Assistive device utilized: Single point cane Level of assistance: SBA Comments: using knee brace which was sitting on top of R foot and hindering, circumduction R LE noted   TODAY'S TREATMENT:                                                                                                                              DATE:  02/22/22 NuStep L 5 x 6 min AROM R knee 1-100 S2S holding blue ball 2x15 R knee PROM  6in box on airex step ups x 10 each HS cuels 25lb 2x10, RLE 15lb 2x10 Leg Ext 10lb 2x10, RLE 5lb 2x5 some assist needed  02/21/23:  Nustep level 6, 6 min, UE's and LE's B knee ext 10#, 2 x 10, eccentric lowering R LE  Hamstring curls R LE 20#, 2 x 10 reps Forward heel taps L from 2" block mass practice, progressed from B Ue support to R Ue to none BOSU lunges with R Le on compliant side 10 reps Bosu smooth side, static standing, rocking side to side, mini squats Gait outdoors on asphalt 300' various inclines Toe raises on black bar, 15 x 2 Leg press B Le's 40# 2 x 10 R LE only 20#, 2 x 10\ Measured r knee flexion 104    02/17/23 NuStep L5 x 6 min LE only Gait to back building 2 flights of stairs alternating pattern  R knee AROM 1-96 deg R knee PROM 105 Forward & Lateral step ups 4in box on airex x 10 Heel raises black bar 2x15 HS curls 20lb 2x10, RLE  Led Ext 10lb 2x10     PATIENT EDUCATION:  Education details: POC, goals Person educated: Patient and Spouse Education method: Explanation, Demonstration, Actor cues, Verbal cues, and Handouts Education comprehension: verbalized understanding, returned demonstration, verbal cues required, tactile cues  required, and needs further education  HOME EXERCISE PROGRAM: Access Code: [email or web address] URL: [email or web address] Date: 01/03/2023 Prepared by: Caralee Ates  Exercises - Supine Heel Slide with Strap  - 1 x daily - 7 x weekly - 3 sets - 10 reps - Supine Straight Leg Raises  - 1 x daily - 7 x weekly -  3 sets - 10 reps - Seated Long Arc Quad  - 1 x daily - 7 x weekly - 3 sets - 10 reps - Sit to Stand  - 1 x daily - 7 x weekly - 3 sets - 10 reps - Standing Heel Raise with Support  - 1 x daily - 7 x weekly - 3 sets - 10 reps - Side Step Down with Unilateral Counter Support  - 1 x daily - 7 x weekly - 3 sets - 10 reps  - Lower Quarter Reach Combination  - 1 x daily - 7 x weekly - 3 sets - 10 reps Access Code: [email or web address] URL: [email or web address] Date: 12/29/2022 Prepared by: Debroah Baller  Exercises - Supine Heel Slide with Strap  - 1 x daily - 7 x weekly - 3 sets - 10 reps - Supine Straight Leg Raises  - 1 x daily - 7 x weekly - 3 sets - 10 reps - Seated Long Arc Quad  - 1 x daily - 7 x weekly - 3 sets - 10 reps - Sit to Stand  - 1 x daily - 7 x weekly - 3 sets - 10 reps - Standing Heel Raise with Support  - 1 x daily - 7 x weekly - 3 sets - 10 reps ASSESSMENT:  CLINICAL IMPRESSION: Patient is a 77 y.o. female who was seen today for physical therapy treatment for rehabilitation of function for R LE after a traumatic fracture R patella 6 months ago.  Strength and balance progressing well. ROM is limited for R knee flexion. MMT test was well but she still has some functional weakness with activity.  Should be finishing with [place] next week. Continues to benefit from skilled PT to recover.  OBJECTIVE IMPAIRMENTS: Abnormal gait, decreased activity tolerance, decreased endurance, decreased mobility, difficulty walking, decreased ROM, decreased strength, increased fascial restrictions, improper body mechanics, and pain.   ACTIVITY LIMITATIONS: carrying, lifting, bending,  sitting, squatting, and locomotion level  PARTICIPATION LIMITATIONS: meal prep, cleaning, laundry, driving, shopping, community activity, and yard work  PERSONAL FACTORS: Age, Time since onset of injury/illness/exacerbation, and 1 comorbidity: HTN  are also affecting patient's functional outcome.   REHAB POTENTIAL: Good  CLINICAL DECISION MAKING: Stable/uncomplicated  EVALUATION COMPLEXITY: Low   GOALS: Goals reviewed with patient? Yes  SHORT TERM GOALS: Target date: 12/29/22 I HEP Baseline:established today Goal status: IN PROGRESS  2.  Improve R knee ROM to 0- 90 Baseline: -6 to 80 Goal status: IN PROGRESS 01/03/23:  flexion to 84 today 01/10/23:  flexion to 88 today 01/19/23 flexion 92, goal met  LONG TERM GOALS: Target date: 03/09/23  Improve FGA from 11 to 28 Baseline: 11 Goal status: INITIAL 01/19/23: 25/28  2.  Foto 61 Baseline: 54 Goal status: Met 02/23/23 67  3.  R quads strength 4+/5 for I transfers, gait efficiency, stairs, uneven terrain Baseline: 3-/5 Goal status:  02/23/23: Met 4+/5    PLAN:  PT FREQUENCY: 2x/week  PT DURATION: 12 weeks  PLANNED INTERVENTIONS: Therapeutic exercises, Therapeutic activity, Neuromuscular re-education, Balance training, Gait training, Patient/Family education, Self Care, and Joint mobilization  PLAN FOR NEXT SESSION: progress with gentle stretching R LE, continue to address R LE strengthening open and closed chain   Grayce Sessions, PTA 02/23/2023, 1:04 PM

## 2023-02-24 ENCOUNTER — Encounter: Payer: Self-pay | Admitting: Family Medicine

## 2023-02-24 ENCOUNTER — Ambulatory Visit: Payer: Medicare PPO | Admitting: Family Medicine

## 2023-02-24 VITALS — BP 164/82 | HR 58 | Temp 97.7°F | Resp 14 | Ht 60.0 in | Wt 141.0 lb

## 2023-02-24 DIAGNOSIS — M8580 Other specified disorders of bone density and structure, unspecified site: Secondary | ICD-10-CM | POA: Diagnosis not present

## 2023-02-24 DIAGNOSIS — M1990 Unspecified osteoarthritis, unspecified site: Secondary | ICD-10-CM | POA: Diagnosis not present

## 2023-02-24 DIAGNOSIS — I10 Essential (primary) hypertension: Secondary | ICD-10-CM

## 2023-02-24 DIAGNOSIS — S82001A Unspecified fracture of right patella, initial encounter for closed fracture: Secondary | ICD-10-CM

## 2023-02-24 DIAGNOSIS — S82041D Displaced comminuted fracture of right patella, subsequent encounter for closed fracture with routine healing: Secondary | ICD-10-CM

## 2023-02-24 DIAGNOSIS — J301 Allergic rhinitis due to pollen: Secondary | ICD-10-CM

## 2023-02-24 DIAGNOSIS — Z0000 Encounter for general adult medical examination without abnormal findings: Secondary | ICD-10-CM | POA: Diagnosis not present

## 2023-02-24 DIAGNOSIS — E785 Hyperlipidemia, unspecified: Secondary | ICD-10-CM

## 2023-02-24 DIAGNOSIS — Z86010 Personal history of colonic polyps: Secondary | ICD-10-CM | POA: Diagnosis not present

## 2023-02-24 MED ORDER — ATORVASTATIN CALCIUM 40 MG PO TABS: 40.0000 mg | ORAL_TABLET | Freq: Every day | ORAL | 3 refills | Status: DC

## 2023-02-24 MED ORDER — VERAPAMIL HCL ER 240 MG PO TBCR: 240.0000 mg | EXTENDED_RELEASE_TABLET | Freq: Every day | ORAL | 3 refills | Status: DC

## 2023-02-24 MED ORDER — HYDROCHLOROTHIAZIDE 12.5 MG PO CAPS: 12.5000 mg | ORAL_CAPSULE | Freq: Every day | ORAL | 3 refills | Status: DC

## 2023-02-24 NOTE — Progress Notes (Signed)
Danielle Yoder is a 77 y.o. female who presents for annual wellness visit and follow-up on chronic medical conditions.  She did suffer a patellar fracture and has been involved in rehab and is now doing quite nicely.  She is almost back to normal walking pattern.  She does have a history of osteopenia and is presently on calcium and vitamin D.  She does have evidence of hypertension however she probably has whitecoat hypertension because her blood pressures at home with a good machine are in the normal range.  Did recommend she bring her cuff just to be safe so we can measure it against ours.  She has had colon cancer screening done.  She continues on atorvastatin, Microzide and verapamil.  Her allergies are under good control.  She does have a history of colonic polyps and is scheduled for routine follow-up on that.  Immunizations and Health Maintenance Immunization History  Administered Date(s) Administered   Fluad Quad(high Dose 65+) 07/01/2020   Influenza Split 05/31/2011, 09/26/2012   Influenza, High Dose Seasonal PF 09/18/2013, 05/17/2017, 05/20/2022   Influenza-Unspecified 09/24/2016, 05/25/2021   Moderna SARS-COV2 Booster Vaccination 06/07/2022   PFIZER Comirnaty(Gray Top)Covid-19 Tri-Sucrose Vaccine 12/24/2020   PFIZER(Purple Top)SARS-COV-2 Vaccination 10/04/2019, 10/25/2019, 06/03/2020   Pfizer Covid-19 Vaccine Bivalent Booster 57yrs & up 06/09/2021   Pneumococcal Conjugate-13 03/25/2016   Pneumococcal Polysaccharide-23 04/30/2009, 01/07/2014   Rsv, Bivalent, Protein Subunit Rsvpref,pf Verdis Frederickson) 06/30/2022   Tdap 03/13/2010, 02/02/2020, 11/14/2021   Zoster Recombinat (Shingrix) 01/12/2018, 04/24/2018   Zoster, Live 05/01/2008   Health Maintenance Due  Topic Date Due   COVID-19 Vaccine (7 - 2023-24 season) 08/02/2022   Medicare Annual Wellness (AWV)  02/06/2023    Last Pap smear: n/a Last mammogram: 12/09/2022 Last colonoscopy: 2007 Last DEXA: 08/10/2022 Dentist: with the  last year Ophtho:more than 1 year ago Exercise: attends PT twice a week  Other doctors caring for patient include:[facility]  Advanced directives: Yes. On file    Depression screen:  See questionnaire below.     02/24/2023    9:28 AM 02/05/2022   12:01 PM 02/02/2021    9:40 AM 03/19/2020    8:56 AM 03/04/2020    4:09 PM  Depression screen PHQ 2/9  Decreased Interest 0 0 0 0 0  Down, Depressed, Hopeless 0 0 0 0 0  PHQ - 2 Score 0 0 0 0 0  Altered sleeping  0     Tired, decreased energy  0     Change in appetite  0     Feeling bad or failure about yourself   0     Trouble concentrating  0     Moving slowly or fidgety/restless  0     Suicidal thoughts  0     PHQ-9 Score  0     Difficult doing work/chores  Not difficult at all       Fall Risk Screen: see questionnaire below.    02/24/2023    9:28 AM 02/05/2022   12:00 PM 02/02/2021    9:39 AM 03/04/2020    4:09 PM 01/31/2020    9:00 AM  Fall Risk   Falls in the past year? 1 1 0 0 0  Comment  tripped due to a dog     Number falls in past yr: 0 1 0    Injury with Fall? 1 0 0    Risk for fall due to : History of fall(s) Medication side effect No Fall Risks    Follow up  Falls evaluation completed Falls evaluation completed;Education provided;Falls prevention discussed Falls evaluation completed      ADL screen:  See questionnaire below Functional Status Survey: Is the patient deaf or have difficulty hearing?: No Does the patient have difficulty seeing, even when wearing glasses/contacts?: No Does the patient have difficulty concentrating, remembering, or making decisions?: No Does the patient have difficulty walking or climbing stairs?: Yes Does the patient have difficulty dressing or bathing?: No Does the patient have difficulty doing errands alone such as visiting a doctor's office or shopping?: No   Review of Systems Constitutional: -, -unexpected weight change, -anorexia, -fatigue Allergy: -sneezing, -itching,  -congestion Dermatology: denies changing moles, rash, lumps ENT: -runny nose, -ear pain, -sore throat,  Cardiology:  -chest pain, -palpitations, -orthopnea, Respiratory: -cough, -shortness of breath, -dyspnea on exertion, -wheezing,  Gastroenterology: -abdominal pain, -nausea, -vomiting, -diarrhea, -constipation, -dysphagia Hematology: -bleeding or bruising problems Musculoskeletal: -arthralgias, -myalgias, -joint swelling, -back pain, - Ophthalmology: -vision changes,  Urology: -dysuria, -difficulty urinating,  -urinary frequency, -urgency, incontinence Neurology: -, -numbness, , -memory loss, -falls, -dizziness    PHYSICAL EXAM:  Ht 5' (1.524 m)   Wt 141 lb (64 kg)   BMI 27.54 kg/m   General Appearance: Alert, cooperative, no distress, appears stated age Head: Normocephalic, without obvious abnormality, atraumatic Eyes: PERRL, conjunctiva/corneas clear, EOM's intact,  Ears: Normal TM's and external ear canals Nose: Nares normal, mucosa normal, no drainage or sinus tenderness Throat: Lips, mucosa, and tongue normal; teeth and gums normal Neck: Supple, no lymphadenopathy;  thyroid:  no enlargement/tenderness/nodules; no carotid bruit or JVD Lungs: Clear to auscultation bilaterally without wheezes, rales or ronchi; respirations unlabored Heart: Regular rate and rhythm, S1 and S2 normal, no murmur, rubor gallop Abdomen: Soft, non-tender, nondistended, normoactive bowel sounds,  no masses, no hepatosplenomegaly Extremities: No clubbing, cyanosis or edema Pulses: 2+ and symmetric all extremities Skin:  Skin color, texture, turgor normal, no rashes or lesions Lymph nodes: Cervical, supraclavicular, and axillary nodes normal Neurologic:  CNII-XII intact, normal strength, sensation and gait; reflexes 2+ and symmetric throughout Psych: Normal mood, affect, hygiene and grooming.  ASSESSMENT/PLAN: Primary hypertension - Plan: CBC with Differential/Platelet, Comprehensive metabolic  panel  Non-seasonal allergic rhinitis due to pollen  Arthritis  Osteopenia, unspecified location - Plan: VITAMIN D 25 Hydroxy (Vit-D Deficiency, Fractures)  Unspecified fracture of right patella, initial encounter for closed fracture  History of colonic polyps - Plan: CBC with Differential/Platelet, Comprehensive metabolic panel  Hyperlipidemia with target LDL less than 100 - Plan: Lipid panel, atorvastatin (LIPITOR) 40 MG tablet  Closed displaced comminuted fracture of right patella with routine healing, subsequent encounter  Essential hypertension - Plan: hydrochlorothiazide (MICROZIDE) 12.5 MG capsule, verapamil (CALAN-SR) 240 MG CR tablet    Discussed  30 minutes of aerobic activity at least 5 days/week and weight-bearing exercise 2x/week; p  healthy diet, including goals of calcium and vitamin D intake and a  Immunization recommendations discussed.  Colonoscopy recommendations reviewed   Medicare Attestation I have personally reviewed: The patient's medical and social history Their use of alcohol, tobacco or illicit drugs Their current medications and supplements The patient's functional ability including ADLs,fall risks, home safety risks, cognitive, and hearing and visual impairment Diet and physical activities Evidence for depression or mood disorders  The patient's weight, height, and BMI have been recorded in the chart.  I have made referrals, counseling, and provided education to the patient based on review of the above and I have provided the patient with a written personalized care plan for  preventive services.     Sharlot Gowda, MD   02/24/2023

## 2023-02-25 ENCOUNTER — Encounter: Payer: Self-pay | Admitting: Family Medicine

## 2023-02-25 LAB — CBC WITH DIFFERENTIAL/PLATELET
Basophils Absolute: 0.1 10*3/uL (ref 0.0–0.2)
Basos: 1 %
EOS (ABSOLUTE): 0.7 10*3/uL — ABNORMAL HIGH (ref 0.0–0.4)
Eos: 11 %
Hematocrit: 40.1 % (ref 34.0–46.6)
Hemoglobin: 13 g/dL (ref 11.1–15.9)
Immature Grans (Abs): 0 10*3/uL (ref 0.0–0.1)
Immature Granulocytes: 0 %
Lymphocytes Absolute: 2.1 10*3/uL (ref 0.7–3.1)
Lymphs: 32 %
MCH: 27.8 pg (ref 26.6–33.0)
MCHC: 32.4 g/dL (ref 31.5–35.7)
MCV: 86 fL (ref 79–97)
Monocytes Absolute: 0.7 10*3/uL (ref 0.1–0.9)
Monocytes: 11 %
Neutrophils Absolute: 2.9 10*3/uL (ref 1.4–7.0)
Neutrophils: 45 %
Platelets: 223 10*3/uL (ref 150–450)
RBC: 4.67 x10E6/uL (ref 3.77–5.28)
RDW: 12.2 % (ref 11.7–15.4)
WBC: 6.4 10*3/uL (ref 3.4–10.8)

## 2023-02-25 LAB — COMPREHENSIVE METABOLIC PANEL
ALT: 18 IU/L (ref 0–32)
AST: 23 IU/L (ref 0–40)
Albumin/Globulin Ratio: 1.6
Albumin: 4.3 g/dL (ref 3.8–4.8)
Alkaline Phosphatase: 78 IU/L (ref 44–121)
BUN/Creatinine Ratio: 22 (ref 12–28)
BUN: 18 mg/dL (ref 8–27)
Bilirubin Total: 0.5 mg/dL (ref 0.0–1.2)
CO2: 24 mmol/L (ref 20–29)
Calcium: 10.1 mg/dL (ref 8.7–10.3)
Chloride: 104 mmol/L (ref 96–106)
Creatinine, Ser: 0.82 mg/dL (ref 0.57–1.00)
Globulin, Total: 2.7 g/dL (ref 1.5–4.5)
Glucose: 84 mg/dL (ref 70–99)
Potassium: 4.4 mmol/L (ref 3.5–5.2)
Sodium: 141 mmol/L (ref 134–144)
Total Protein: 7 g/dL (ref 6.0–8.5)
eGFR: 74 mL/min/{1.73_m2} (ref >59)

## 2023-02-25 LAB — LIPID PANEL
Chol/HDL Ratio: 3.4 ratio (ref 0.0–4.4)
Cholesterol, Total: 163 mg/dL (ref 100–199)
HDL: 48 mg/dL (ref >39)
LDL Chol Calc (NIH): 95 mg/dL (ref 0–99)
Triglycerides: 113 mg/dL (ref 0–149)
VLDL Cholesterol Cal: 20 mg/dL (ref 5–40)

## 2023-02-25 LAB — VITAMIN D 25 HYDROXY (VIT D DEFICIENCY, FRACTURES): Vit D, 25-Hydroxy: 41.3 ng/mL (ref 30.0–100.0)

## 2023-02-28 ENCOUNTER — Ambulatory Visit: Payer: Medicare PPO | Admitting: Physical Therapy

## 2023-02-28 ENCOUNTER — Encounter: Payer: Self-pay | Admitting: Physical Therapy

## 2023-02-28 DIAGNOSIS — S82041A Displaced comminuted fracture of right patella, initial encounter for closed fracture: Secondary | ICD-10-CM

## 2023-02-28 DIAGNOSIS — S76111D Strain of right quadriceps muscle, fascia and tendon, subsequent encounter: Secondary | ICD-10-CM | POA: Diagnosis not present

## 2023-02-28 DIAGNOSIS — M25661 Stiffness of right knee, not elsewhere classified: Secondary | ICD-10-CM

## 2023-02-28 DIAGNOSIS — R262 Difficulty in walking, not elsewhere classified: Secondary | ICD-10-CM | POA: Diagnosis not present

## 2023-02-28 NOTE — Therapy (Signed)
OUTPATIENT PHYSICAL THERAPY LOWER EXTREMITY TREATMENT     Patient Name: Danielle Yoder MRN: [id number] DOB:[birth year], 77 y.o., female Today's Date: 02/28/2023  END OF SESSION:  PT End of Session - 02/28/23 1255     Visit Number 21    Date for PT Re-Evaluation 03/09/23    PT Start Time 1300    PT Stop Time 1345    PT Time Calculation (min) 45 min    Activity Tolerance Patient tolerated treatment well    Behavior During Therapy WFL for tasks assessed/performed                  Past Medical History:  Diagnosis Date   Allergy    RHINITIS   Arthritis    Colonic polyp    Dyslipidemia    Hemorrhoids    Hypertension    Past Surgical History:  Procedure Laterality Date   FOOT SURGERY Right    bone shaved off; 2 surgeries (recurred 8 yrs later)   FOOT SURGERY Left    bone shaved off ; 2 surgeries (recurred)   ORIF PATELLA Right 09/08/2022   Procedure: OPEN REDUCTION INTERNAL FIXATION (ORIF) PATELLA;  Surgeon: London Sheer, MD;  Location: [facility];  Service: Orthopedics;  Laterality: Right;   Patient Active Problem List   Diagnosis Date Noted   Unspecified fracture of right patella, initial encounter for closed fracture 09/07/2022   Osteopenia 02/17/2018   Family history of heart disease in female family member before age 78 05/31/2011   Arthritis 05/31/2011   Hyperlipidemia with target LDL less than 100 05/31/2011   Hypertension 05/31/2011   Allergic rhinitis due to pollen 05/31/2011   History of colonic polyps 05/31/2011    PCP: Sharlot Gowda, MD  REFERRING PROVIDER: Willia Craze, MD  REFERRING DIAG: Closed displaced comminuted fracture of right patella with routine healing, subsequent encounter, partial patellectomy.  THERAPY DIAG:  Displaced comminuted fracture of right patella, initial encounter for closed fracture  Difficulty in walking, not elsewhere classified  Strain of right quadriceps muscle, fascia and tendon, subsequent  encounter  Stiffness of right knee, not elsewhere classified  Rationale for Evaluation and Treatment: Rehabilitation  ONSET DATE: 09/08/22  SUBJECTIVE:   SUBJECTIVE STATEMENT: "Im doing, Im tired"  PERTINENT HISTORY: Fell at home on corner of air mattress at [employer], landed on R knee fractured R patella, underwent surgery to debride and repair.  Has undergone home health PT, now referred to outpatient PT to continue rehab PAIN:  Are you having pain? Yes: NPRS scale: 0/10 Pain location: R ant knee Pain description: occasional twinges Aggravating factors: certain movements Relieving factors: resting  PRECAUTIONS: Other: from MD referral; Work on Right knee active range of motion and quad strengtheningWork on Right knee active range of motion and quad strengthening  WEIGHT BEARING RESTRICTIONS: No  FALLS:  Has patient fallen in last 6 months? Yes. Number of falls 1  LIVING ENVIRONMENT: Lives with: lives with their family and lives with their spouse Lives in: House/apartment Stairs: Yes: Internal: 11 steps; on right going up Has following equipment at home: Single point cane and Walker - 2 wheeled  OCCUPATION: retired  PLOF: Independent  PATIENT GOALS: return to I lifestyle  NEXT MD VISIT: December 22 2022  OBJECTIVE:   DIAGNOSTIC FINDINGS: na  PATIENT SURVEYS:  FOTO 35  COGNITION: Overall cognitive status: Within functional limits for tasks assessed     SENSATION: WFL  EDEMA:  None noted POSTURE: weight shift left  PALPATION: Thickened R  quads, distally especially at patellar attachments, patella with good multidirectional mobility  LOWER EXTREMITY ROM:all wnl except R knee flexion in sitting 79, flexion supine to 70 Extension in supine -7 01/03/23:  R knee flexion in sitting 84, supine to 80, R knee extension(supine R heel on roll) 0 01/10/23: R knee flexion 88 01/19/23: 93 flexion 01/31/23:  R knee flex 98 degrees  LOWER EXTREMITY MMT: R quads with 20  degree lag for LAQ, 01/19/23: 7 degree lag, MMT 4+/5 R SLR with 10 degree lag 01/19/23: SLR with 3 degree lag R hamstring 4-, 01/19/23:  R hamstring 5/5 Hip extension, abduction wnl R plantarflexors wnl     FUNCTIONAL TESTS:  Functional gait assessment: 11     GAIT: Distance walked: within department Assistive device utilized: Single point cane Level of assistance: SBA Comments: using knee brace which was sitting on top of R foot and hindering, circumduction R LE noted   TODAY'S TREATMENT:                                                                                                                              DATE:  02/28/23 NuStep L5 x 6 min Leg press 50lb 3x12, Heel raises 50lb 2x15  HS cuels 35lb 2x10, RLE 15lb 2x10 Leg Ext 10lb 2x10, RLE 5lb 2x5 some assist needed LAQ RLE 3lb 2x10 Side step over foam roll ont airex 2x5 each R knee PROM   02/22/22 NuStep L 5 x 6 min AROM R knee 1-100 S2S holding blue ball 2x15 R knee PROM  6in box on airex step ups x 10 each HS cuels 25lb 2x10, RLE 15lb 2x10 Leg Ext 10lb 2x10, RLE 5lb 2x5 some assist needed  02/21/23:  Nustep level 6, 6 min, UE's and LE's B knee ext 10#, 2 x 10, eccentric lowering R LE  Hamstring curls R LE 20#, 2 x 10 reps Forward heel taps L from 2" block mass practice, progressed from B Ue support to R Ue to none BOSU lunges with R Le on compliant side 10 reps Bosu smooth side, static standing, rocking side to side, mini squats Gait outdoors on asphalt 300' various inclines Toe raises on black bar, 15 x 2 Leg press B Le's 40# 2 x 10 R LE only 20#, 2 x 10\ Measured r knee flexion 104    02/17/23 NuStep L5 x 6 min LE only Gait to back building 2 flights of stairs alternating pattern  R knee AROM 1-96 deg R knee PROM 105 Forward & Lateral step ups 4in box on airex x 10 Heel raises black bar 2x15 HS curls 20lb 2x10, RLE  Led Ext 10lb 2x10     PATIENT EDUCATION:  Education details: POC, goals Person  educated: Patient and Spouse Education method: Explanation, Demonstration, Actor cues, Verbal cues, and Handouts Education comprehension: verbalized understanding, returned demonstration, verbal cues required, tactile cues required, and needs further education  HOME EXERCISE PROGRAM: Access Code: [email or web address]  URL: [email or web address] Date: 01/03/2023 Prepared by: Linton Rump Speaks  Exercises - Supine Heel Slide with Strap  - 1 x daily - 7 x weekly - 3 sets - 10 reps - Supine Straight Leg Raises  - 1 x daily - 7 x weekly - 3 sets - 10 reps - Seated Long Arc Quad  - 1 x daily - 7 x weekly - 3 sets - 10 reps - Sit to Stand  - 1 x daily - 7 x weekly - 3 sets - 10 reps - Standing Heel Raise with Support  - 1 x daily - 7 x weekly - 3 sets - 10 reps - Side Step Down with Unilateral Counter Support  - 1 x daily - 7 x weekly - 3 sets - 10 reps  - Lower Quarter Reach Combination  - 1 x daily - 7 x weekly - 3 sets - 10 reps Access Code: [email or web address] URL: [email or web address] Date: 12/29/2022 Prepared by: Debroah Baller  Exercises - Supine Heel Slide with Strap  - 1 x daily - 7 x weekly - 3 sets - 10 reps - Supine Straight Leg Raises  - 1 x daily - 7 x weekly - 3 sets - 10 reps - Seated Long Arc Quad  - 1 x daily - 7 x weekly - 3 sets - 10 reps - Sit to Stand  - 1 x daily - 7 x weekly - 3 sets - 10 reps - Standing Heel Raise with Support  - 1 x daily - 7 x weekly - 3 sets - 10 reps ASSESSMENT:  CLINICAL IMPRESSION: Patient is a 77 y.o. female who was seen today for physical therapy treatment for rehabilitation of function for R LE after a traumatic fracture R patella 6 months ago.  Strength and balance progressing well. ROM is limited for R knee flexion. She still has some functional weakness with activity. Some instability with side step on airex.  Should be finishing with [place] this week. Continues to benefit from skilled PT to recover.  OBJECTIVE IMPAIRMENTS: Abnormal gait,  decreased activity tolerance, decreased endurance, decreased mobility, difficulty walking, decreased ROM, decreased strength, increased fascial restrictions, improper body mechanics, and pain.   ACTIVITY LIMITATIONS: carrying, lifting, bending, sitting, squatting, and locomotion level  PARTICIPATION LIMITATIONS: meal prep, cleaning, laundry, driving, shopping, community activity, and yard work  PERSONAL FACTORS: Age, Time since onset of injury/illness/exacerbation, and 1 comorbidity: HTN  are also affecting patient's functional outcome.   REHAB POTENTIAL: Good  CLINICAL DECISION MAKING: Stable/uncomplicated  EVALUATION COMPLEXITY: Low   GOALS: Goals reviewed with patient? Yes  SHORT TERM GOALS: Target date: 12/29/22 I HEP Baseline:established today Goal status: IN PROGRESS  2.  Improve R knee ROM to 0- 90 Baseline: -6 to 80 Goal status: IN PROGRESS 01/03/23:  flexion to 84 today 01/10/23:  flexion to 88 today 01/19/23 flexion 92, goal met  LONG TERM GOALS: Target date: 03/09/23  Improve FGA from 11 to 28 Baseline: 11 Goal status: INITIAL 01/19/23: 25/28  2.  Foto 61 Baseline: 54 Goal status: Met 02/23/23 67  3.  R quads strength 4+/5 for I transfers, gait efficiency, stairs, uneven terrain Baseline: 3-/5 Goal status:  02/23/23: Met 4+/5    PLAN:  PT FREQUENCY: 2x/week  PT DURATION: 12 weeks  PLANNED INTERVENTIONS: Therapeutic exercises, Therapeutic activity, Neuromuscular re-education, Balance training, Gait training, Patient/Family education, Self Care, and Joint mobilization  PLAN FOR NEXT SESSION: progress with gentle stretching R LE, continue to address R LE strengthening open and  closed chain   Grayce Sessions, PTA 02/28/2023, 12:56 PM

## 2023-03-02 ENCOUNTER — Ambulatory Visit: Payer: Medicare PPO | Admitting: Physical Therapy

## 2023-03-09 ENCOUNTER — Encounter: Payer: Self-pay | Admitting: Physical Therapy

## 2023-03-09 ENCOUNTER — Ambulatory Visit: Payer: Medicare PPO | Admitting: Physical Therapy

## 2023-03-09 DIAGNOSIS — R262 Difficulty in walking, not elsewhere classified: Secondary | ICD-10-CM

## 2023-03-09 DIAGNOSIS — S76111D Strain of right quadriceps muscle, fascia and tendon, subsequent encounter: Secondary | ICD-10-CM | POA: Diagnosis not present

## 2023-03-09 DIAGNOSIS — M25661 Stiffness of right knee, not elsewhere classified: Secondary | ICD-10-CM

## 2023-03-09 DIAGNOSIS — S82041A Displaced comminuted fracture of right patella, initial encounter for closed fracture: Secondary | ICD-10-CM | POA: Diagnosis not present

## 2023-03-09 NOTE — Therapy (Signed)
OUTPATIENT PHYSICAL THERAPY LOWER EXTREMITY TREATMENT     Patient Name: Danielle Yoder MRN: [id number] DOB:[birth year], 77 y.o., female Today's Date: 03/09/2023  END OF SESSION:  PT End of Session - 03/09/23 1341     Visit Number 22    Date for PT Re-Evaluation 03/09/23    PT Start Time 1342    PT Stop Time 1430    PT Time Calculation (min) 48 min    Activity Tolerance Patient tolerated treatment well    Behavior During Therapy WFL for tasks assessed/performed                  Past Medical History:  Diagnosis Date   Allergy    RHINITIS   Arthritis    Colonic polyp    Dyslipidemia    Hemorrhoids    Hypertension    Past Surgical History:  Procedure Laterality Date   FOOT SURGERY Right    bone shaved off; 2 surgeries (recurred 8 yrs later)   FOOT SURGERY Left    bone shaved off ; 2 surgeries (recurred)   ORIF PATELLA Right 09/08/2022   Procedure: OPEN REDUCTION INTERNAL FIXATION (ORIF) PATELLA;  Surgeon: London Sheer, MD;  Location: [facility];  Service: Orthopedics;  Laterality: Right;   Patient Active Problem List   Diagnosis Date Noted   Unspecified fracture of right patella, initial encounter for closed fracture 09/07/2022   Osteopenia 02/17/2018   Family history of heart disease in female family member before age 15 05/31/2011   Arthritis 05/31/2011   Hyperlipidemia with target LDL less than 100 05/31/2011   Hypertension 05/31/2011   Allergic rhinitis due to pollen 05/31/2011   History of colonic polyps 05/31/2011    PCP: Sharlot Gowda, MD  REFERRING PROVIDER: Willia Craze, MD  REFERRING DIAG: Closed displaced comminuted fracture of right patella with routine healing, subsequent encounter, partial patellectomy.  THERAPY DIAG:  Displaced comminuted fracture of right patella, initial encounter for closed fracture  Difficulty in walking, not elsewhere classified  Strain of right quadriceps muscle, fascia and tendon, subsequent  encounter  Stiffness of right knee, not elsewhere classified  Rationale for Evaluation and Treatment: Rehabilitation  ONSET DATE: 09/08/22  SUBJECTIVE:   SUBJECTIVE STATEMENT: "I'm ok a little stiff no pain pain"  PERTINENT HISTORY: Fell at home on corner of air mattress at [employer], landed on R knee fractured R patella, underwent surgery to debride and repair.  Has undergone home health PT, now referred to outpatient PT to continue rehab PAIN:  Are you having pain? Yes: NPRS scale: 0/10 Pain location: R ant knee Pain description: occasional twinges Aggravating factors: certain movements Relieving factors: resting  PRECAUTIONS: Other: from MD referral; Work on Right knee active range of motion and quad strengtheningWork on Right knee active range of motion and quad strengthening  WEIGHT BEARING RESTRICTIONS: No  FALLS:  Has patient fallen in last 6 months? Yes. Number of falls 1  LIVING ENVIRONMENT: Lives with: lives with their family and lives with their spouse Lives in: House/apartment Stairs: Yes: Internal: 11 steps; on right going up Has following equipment at home: Single point cane and Walker - 2 wheeled  OCCUPATION: retired  PLOF: Independent  PATIENT GOALS: return to I lifestyle  NEXT MD VISIT: December 22 2022  OBJECTIVE:   DIAGNOSTIC FINDINGS: na  PATIENT SURVEYS:  FOTO 8  COGNITION: Overall cognitive status: Within functional limits for tasks assessed     SENSATION: WFL  EDEMA:  None noted POSTURE: weight shift left  PALPATION: Thickened R quads, distally especially at patellar attachments, patella with good multidirectional mobility  LOWER EXTREMITY ROM:all wnl except R knee flexion in sitting 79, flexion supine to 70 Extension in supine -7 01/03/23:  R knee flexion in sitting 84, supine to 80, R knee extension(supine R heel on roll) 0 01/10/23: R knee flexion 88 01/19/23: 93 flexion 01/31/23:  R knee flex 98 degrees 03/09/23: R knee AROM  0-100  LOWER EXTREMITY MMT: R quads with 20 degree lag for LAQ, 01/19/23: 7 degree lag, MMT 4+/5 R SLR with 10 degree lag 01/19/23: SLR with 3 degree lag R hamstring 4-, 01/19/23:  R hamstring 5/5 Hip extension, abduction wnl R plantarflexors wnl     FUNCTIONAL TESTS:  Functional gait assessment: 11     GAIT: Distance walked: within department Assistive device utilized: Single point cane Level of assistance: SBA Comments: using knee brace which was sitting on top of R foot and hindering, circumduction R LE noted   TODAY'S TREATMENT:                                                                                                                              DATE:  03/09/23 NuStep L5 x 6 min FGA Leg press 50lb 2x10, RLE 20lb 2x10 R knee/scar massage R knee PROM and contract-relax stretching HS curls 35lb x10; 25 lb x10 (dropped weight due to pt sliding forward in chair); RLE 15lb 2x10 Knee ext 10lb 2x10; RLE 5lb 3x5 Resisted gt 30lbs forward/backward/side step x3 each way  02/28/23 NuStep L5 x 6 min Leg press 50lb 3x12, Heel raises 50lb 2x15  HS cuels 35lb 2x10, RLE 15lb 2x10 Leg Ext 10lb 2x10, RLE 5lb 2x5 some assist needed LAQ RLE 3lb 2x10 Side step over foam roll ont airex 2x5 each R knee PROM    PATIENT EDUCATION:  Education details: POC, goals Person educated: Patient and Spouse Education method: Explanation, Demonstration, Tactile cues, Verbal cues, and Handouts Education comprehension: verbalized understanding, returned demonstration, verbal cues required, tactile cues required, and needs further education  HOME EXERCISE PROGRAM: Access Code: [email or web address] URL: [email or web address] Date: 01/03/2023 Prepared by: Linton Rump Speaks  Exercises - Supine Heel Slide with Strap  - 1 x daily - 7 x weekly - 3 sets - 10 reps - Supine Straight Leg Raises  - 1 x daily - 7 x weekly - 3 sets - 10 reps - Seated Long Arc Quad  - 1 x daily - 7 x weekly - 3 sets - 10 reps -  Sit to Stand  - 1 x daily - 7 x weekly - 3 sets - 10 reps - Standing Heel Raise with Support  - 1 x daily - 7 x weekly - 3 sets - 10 reps - Side Step Down with Unilateral Counter Support  - 1 x daily - 7 x weekly - 3 sets - 10 reps  - Lower Quarter Reach Combination  - 1 x daily - 7  x weekly - 3 sets - 10 reps Access Code: [email or web address] URL: [email or web address] Date: 12/29/2022 Prepared by: Debroah Baller  Exercises - Supine Heel Slide with Strap  - 1 x daily - 7 x weekly - 3 sets - 10 reps - Supine Straight Leg Raises  - 1 x daily - 7 x weekly - 3 sets - 10 reps - Seated Long Arc Quad  - 1 x daily - 7 x weekly - 3 sets - 10 reps - Sit to Stand  - 1 x daily - 7 x weekly - 3 sets - 10 reps - Standing Heel Raise with Support  - 1 x daily - 7 x weekly - 3 sets - 10 reps ASSESSMENT:  CLINICAL IMPRESSION: Patient is a 77 y.o. female who was seen today for physical therapy treatment for rehabilitation of function for R LE after a traumatic fracture R patella 6 months ago.  Strength and balance progressing well. ROM is still limited for R knee flexion. She has progressed increasing her FTGA score. She still has some functional weakness with activity, and still ambulated with stiff RLE. Continues to benefit from skilled PT to recover.  OBJECTIVE IMPAIRMENTS: Abnormal gait, decreased activity tolerance, decreased endurance, decreased mobility, difficulty walking, decreased ROM, decreased strength, increased fascial restrictions, improper body mechanics, and pain.   ACTIVITY LIMITATIONS: carrying, lifting, bending, sitting, squatting, and locomotion level  PARTICIPATION LIMITATIONS: meal prep, cleaning, laundry, driving, shopping, community activity, and yard work  PERSONAL FACTORS: Age, Time since onset of injury/illness/exacerbation, and 1 comorbidity: HTN  are also affecting patient's functional outcome.   REHAB POTENTIAL: Good  CLINICAL DECISION MAKING:  Stable/uncomplicated  EVALUATION COMPLEXITY: Low   GOALS: Goals reviewed with patient? Yes  SHORT TERM GOALS: Target date: 12/29/22 I HEP Baseline:established today Goal status: IN PROGRESS  2.  Improve R knee ROM to 0- 90 Baseline: -6 to 80 Goal status: IN PROGRESS 01/03/23:  flexion to 84 today 01/10/23:  flexion to 88 today 01/19/23 flexion 92, goal met  LONG TERM GOALS: Target date:8/7 /24  Improve FGA from 11 to 28 Baseline: 11 Goal status: 27 Progressing 03/09/23   2.  Foto 61 Baseline: 54 Goal status: Met 02/23/23 67  3.  R quads strength 4+/5 for I transfers, gait efficiency, stairs, uneven terrain Baseline: 3-/5 Goal status:  02/23/23: Met 4+/5    PLAN:  PT FREQUENCY: 1x/week  PT DURATION: 6 weeks  PLANNED INTERVENTIONS: Therapeutic exercises, Therapeutic activity, Neuromuscular re-education, Balance training, Gait training, Patient/Family education, Self Care, and Joint mobilization  PLAN FOR NEXT SESSION: progress with gentle stretching R LE, continue to address R LE strengthening open and closed chain  I agree with the above assessment and plan. Caralee Ates, PT, DPT Grayce Sessions, PTA 03/09/2023, 1:44 PM

## 2023-03-10 NOTE — Addendum Note (Signed)
Addended byMaxcine Ham, Darsi Tien L on: 03/10/2023 05:56 PM   Modules accepted: Orders

## 2023-03-16 ENCOUNTER — Ambulatory Visit: Payer: Medicare PPO | Attending: Orthopedic Surgery | Admitting: Physical Therapy

## 2023-03-16 ENCOUNTER — Encounter: Payer: Self-pay | Admitting: Physical Therapy

## 2023-03-16 DIAGNOSIS — S76111D Strain of right quadriceps muscle, fascia and tendon, subsequent encounter: Secondary | ICD-10-CM | POA: Diagnosis not present

## 2023-03-16 DIAGNOSIS — M25661 Stiffness of right knee, not elsewhere classified: Secondary | ICD-10-CM | POA: Diagnosis not present

## 2023-03-16 DIAGNOSIS — S82041A Displaced comminuted fracture of right patella, initial encounter for closed fracture: Secondary | ICD-10-CM | POA: Diagnosis not present

## 2023-03-16 DIAGNOSIS — R262 Difficulty in walking, not elsewhere classified: Secondary | ICD-10-CM | POA: Diagnosis not present

## 2023-03-16 NOTE — Therapy (Signed)
OUTPATIENT PHYSICAL THERAPY LOWER EXTREMITY TREATMENT     Patient Name: Danielle Yoder MRN: [id number] DOB:[birth year], 77 y.o., female Today's Date: 03/16/2023  END OF SESSION:  PT End of Session - 03/16/23 1139     Visit Number 23    Date for PT Re-Evaluation 04/20/23    PT Start Time 1140    PT Stop Time 1230    PT Time Calculation (min) 50 min    Activity Tolerance Patient tolerated treatment well    Behavior During Therapy WFL for tasks assessed/performed                  Past Medical History:  Diagnosis Date   Allergy    RHINITIS   Arthritis    Colonic polyp    Dyslipidemia    Hemorrhoids    Hypertension    Past Surgical History:  Procedure Laterality Date   FOOT SURGERY Right    bone shaved off; 2 surgeries (recurred 8 yrs later)   FOOT SURGERY Left    bone shaved off ; 2 surgeries (recurred)   ORIF PATELLA Right 09/08/2022   Procedure: OPEN REDUCTION INTERNAL FIXATION (ORIF) PATELLA;  Surgeon: London Sheer, MD;  Location: [facility];  Service: Orthopedics;  Laterality: Right;   Patient Active Problem List   Diagnosis Date Noted   Unspecified fracture of right patella, initial encounter for closed fracture 09/07/2022   Osteopenia 02/17/2018   Family history of heart disease in female family member before age 67 05/31/2011   Arthritis 05/31/2011   Hyperlipidemia with target LDL less than 100 05/31/2011   Hypertension 05/31/2011   Allergic rhinitis due to pollen 05/31/2011   History of colonic polyps 05/31/2011    PCP: Sharlot Gowda, MD  REFERRING PROVIDER: Willia Craze, MD  REFERRING DIAG: Closed displaced comminuted fracture of right patella with routine healing, subsequent encounter, partial patellectomy.  THERAPY DIAG:  Displaced comminuted fracture of right patella, initial encounter for closed fracture  Difficulty in walking, not elsewhere classified  Strain of right quadriceps muscle, fascia and tendon, subsequent  encounter  Stiffness of right knee, not elsewhere classified  Rationale for Evaluation and Treatment: Rehabilitation  ONSET DATE: 09/08/22  SUBJECTIVE:   SUBJECTIVE STATEMENT: "Im doing fine" PERTINENT HISTORY: Fell at home on corner of air mattress at [employer], landed on R knee fractured R patella, underwent surgery to debride and repair.  Has undergone home health PT, now referred to outpatient PT to continue rehab PAIN:  Are you having pain? Yes: NPRS scale: 0/10 Pain location: R ant knee Pain description: occasional twinges Aggravating factors: certain movements Relieving factors: resting  PRECAUTIONS: Other: from MD referral; Work on Right knee active range of motion and quad strengtheningWork on Right knee active range of motion and quad strengthening  WEIGHT BEARING RESTRICTIONS: No  FALLS:  Has patient fallen in last 6 months? Yes. Number of falls 1  LIVING ENVIRONMENT: Lives with: lives with their family and lives with their spouse Lives in: House/apartment Stairs: Yes: Internal: 11 steps; on right going up Has following equipment at home: Single point cane and Walker - 2 wheeled  OCCUPATION: retired  PLOF: Independent  PATIENT GOALS: return to I lifestyle  NEXT MD VISIT: December 22 2022  OBJECTIVE:   DIAGNOSTIC FINDINGS: na  PATIENT SURVEYS:  FOTO 26  COGNITION: Overall cognitive status: Within functional limits for tasks assessed     SENSATION: WFL  EDEMA:  None noted POSTURE: weight shift left  PALPATION: Thickened R quads, distally  especially at patellar attachments, patella with good multidirectional mobility  LOWER EXTREMITY ROM:all wnl except R knee flexion in sitting 79, flexion supine to 70 Extension in supine -7 01/03/23:  R knee flexion in sitting 84, supine to 80, R knee extension(supine R heel on roll) 0 01/10/23: R knee flexion 88 01/19/23: 93 flexion 01/31/23:  R knee flex 98 degrees 03/09/23: R knee AROM 0-100  LOWER EXTREMITY  MMT: R quads with 20 degree lag for LAQ, 01/19/23: 7 degree lag, MMT 4+/5 R SLR with 10 degree lag 01/19/23: SLR with 3 degree lag R hamstring 4-, 01/19/23:  R hamstring 5/5 Hip extension, abduction wnl R plantarflexors wnl     FUNCTIONAL TESTS:  Functional gait assessment: 11     GAIT: Distance walked: within department Assistive device utilized: Single point cane Level of assistance: SBA Comments: using knee brace which was sitting on top of R foot and hindering, circumduction R LE noted   TODAY'S TREATMENT:                                                                                                                              DATE:  03/16/23 NuStep L 5 x 4 min Bike L 2 x 3 min  STM/PROM/stretching to R LE  LAQs with 3 sec hold 5 lb 2x10  SLS R while throwing and catching x2 CGA with min LOB  Step ups 4 in on 2 airex pads up/back with R x10; up and back with L x10 Leg press 60 lb 2x12; R only 20 lb x12 x5 STS mat table to 2 airex pads 2x15; pt favors the L hip when standing Single leg STS with R x6 min-mod A  HS curls 25 lb 2x10 Knee ext 10 lb 2x10   03/09/23 NuStep L5 x 6 min FGA Leg press 50lb 2x10, RLE 20lb 2x10 R knee/scar massage R knee PROM and contract-relax stretching HS curls 35lb x10; 25 lb x10 (dropped weight due to pt sliding forward in chair); RLE 15lb 2x10 Knee ext 10lb 2x10; RLE 5lb 3x5 Resisted gt 30lbs forward/backward/side step x3 each way  02/28/23 NuStep L5 x 6 min Leg press 50lb 3x12, Heel raises 50lb 2x15  HS cuels 35lb 2x10, RLE 15lb 2x10 Leg Ext 10lb 2x10, RLE 5lb 2x5 some assist needed LAQ RLE 3lb 2x10 Side step over foam roll ont airex 2x5 each R knee PROM    PATIENT EDUCATION:  Education details: POC, goals Person educated: Patient and Spouse Education method: Explanation, Demonstration, Tactile cues, Verbal cues, and Handouts Education comprehension: verbalized understanding, returned demonstration, verbal cues required, tactile  cues required, and needs further education  HOME EXERCISE PROGRAM: Access Code: [email or web address] URL: [email or web address] Date: 01/03/2023 Prepared by: Caralee Ates  Exercises - Supine Heel Slide with Strap  - 1 x daily - 7 x weekly - 3 sets - 10 reps - Supine Straight Leg Raises  - 1 x daily - 7 x weekly - 3  sets - 10 reps - Seated Long Arc Quad  - 1 x daily - 7 x weekly - 3 sets - 10 reps - Sit to Stand  - 1 x daily - 7 x weekly - 3 sets - 10 reps - Standing Heel Raise with Support  - 1 x daily - 7 x weekly - 3 sets - 10 reps - Side Step Down with Unilateral Counter Support  - 1 x daily - 7 x weekly - 3 sets - 10 reps  - Lower Quarter Reach Combination  - 1 x daily - 7 x weekly - 3 sets - 10 reps Access Code: [email or web address] URL: [email or web address] Date: 12/29/2022 Prepared by: Debroah Baller  Exercises - Supine Heel Slide with Strap  - 1 x daily - 7 x weekly - 3 sets - 10 reps - Supine Straight Leg Raises  - 1 x daily - 7 x weekly - 3 sets - 10 reps - Seated Long Arc Quad  - 1 x daily - 7 x weekly - 3 sets - 10 reps - Sit to Stand  - 1 x daily - 7 x weekly - 3 sets - 10 reps - Standing Heel Raise with Support  - 1 x daily - 7 x weekly - 3 sets - 10 reps ASSESSMENT:  CLINICAL IMPRESSION: Patient is a 77 y.o. female who was seen today for physical therapy treatment for rehabilitation of function for R LE after a traumatic fracture R patella 6 months ago. Distal R knee is swollen compared to L. Strength and balance progressing well. Verbal cues needed during LAQs to not use hip flexors when straightening. Verbal cues needed to remind pt to push through heels during leg press. Patient favors the left hip when standing so PTA cued them on shifting to the right. She would benefit from continued PT to improve overall balance and increase strength of her R LE.  OBJECTIVE IMPAIRMENTS: Abnormal gait, decreased activity tolerance, decreased endurance, decreased mobility,  difficulty walking, decreased ROM, decreased strength, increased fascial restrictions, improper body mechanics, and pain.   ACTIVITY LIMITATIONS: carrying, lifting, bending, sitting, squatting, and locomotion level  PARTICIPATION LIMITATIONS: meal prep, cleaning, laundry, driving, shopping, community activity, and yard work  PERSONAL FACTORS: Age, Time since onset of injury/illness/exacerbation, and 1 comorbidity: HTN  are also affecting patient's functional outcome.   REHAB POTENTIAL: Good  CLINICAL DECISION MAKING: Stable/uncomplicated  EVALUATION COMPLEXITY: Low   GOALS: Goals reviewed with patient? Yes  SHORT TERM GOALS: Target date: 12/29/22 I HEP Baseline:established today Goal status: IN PROGRESS  2.  Improve R knee ROM to 0- 90 Baseline: -6 to 80 Goal status: IN PROGRESS 01/03/23:  flexion to 84 today 01/10/23:  flexion to 88 today 01/19/23 flexion 92, goal met  LONG TERM GOALS: Target date:8/7 /24  Improve FGA from 11 to 28 Baseline: 11 Goal status: 27 Progressing 03/09/23   2.  Foto 61 Baseline: 54 Goal status: Met 02/23/23 67  3.  R quads strength 4+/5 for I transfers, gait efficiency, stairs, uneven terrain Baseline: 3-/5 Goal status:  02/23/23: Met 4+/5    PLAN:  PT FREQUENCY: 1x/week  PT DURATION: 6 weeks  PLANNED INTERVENTIONS: Therapeutic exercises, Therapeutic activity, Neuromuscular re-education, Balance training, Gait training, Patient/Family education, Self Care, and Joint mobilization  PLAN FOR NEXT SESSION: progress with gentle stretching R LE, continue to address R LE strengthening open and closed chain  I agree with the above assessment and plan. Caralee Ates, PT, DPT Grayce Sessions, PTA  03/16/2023, 11:41 AM

## 2023-03-23 ENCOUNTER — Ambulatory Visit: Payer: Medicare PPO | Admitting: Physical Therapy

## 2023-03-23 ENCOUNTER — Encounter: Payer: Self-pay | Admitting: Physical Therapy

## 2023-03-23 DIAGNOSIS — M25661 Stiffness of right knee, not elsewhere classified: Secondary | ICD-10-CM | POA: Diagnosis not present

## 2023-03-23 DIAGNOSIS — S82041A Displaced comminuted fracture of right patella, initial encounter for closed fracture: Secondary | ICD-10-CM | POA: Diagnosis not present

## 2023-03-23 DIAGNOSIS — S76111D Strain of right quadriceps muscle, fascia and tendon, subsequent encounter: Secondary | ICD-10-CM | POA: Diagnosis not present

## 2023-03-23 DIAGNOSIS — R262 Difficulty in walking, not elsewhere classified: Secondary | ICD-10-CM | POA: Diagnosis not present

## 2023-03-23 NOTE — Therapy (Signed)
OUTPATIENT PHYSICAL THERAPY LOWER EXTREMITY TREATMENT     Patient Name: Danielle Yoder MRN: [id number] DOB:Dec 26, 1945, 77 y.o., female Today's Date: 03/23/2023  END OF SESSION:  PT End of Session - 03/23/23 1144     Visit Number 24    Date for PT Re-Evaluation 04/20/23    Progress Note Due on Visit 20    PT Start Time 1145    PT Stop Time 1230    PT Time Calculation (min) 45 min    Activity Tolerance Patient tolerated treatment well    Behavior During Therapy WFL for tasks assessed/performed                  Past Medical History:  Diagnosis Date   Allergy    RHINITIS   Arthritis    Colonic polyp    Dyslipidemia    Hemorrhoids    Hypertension    Past Surgical History:  Procedure Laterality Date   FOOT SURGERY Right    bone shaved off; 2 surgeries (recurred 8 yrs later)   FOOT SURGERY Left    bone shaved off ; 2 surgeries (recurred)   ORIF PATELLA Right 09/08/2022   Procedure: OPEN REDUCTION INTERNAL FIXATION (ORIF) PATELLA;  Surgeon: London Sheer, MD;  Location: [facility];  Service: Orthopedics;  Laterality: Right;   Patient Active Problem List   Diagnosis Date Noted   Unspecified fracture of right patella, initial encounter for closed fracture 09/07/2022   Osteopenia 02/17/2018   Family history of heart disease in female family member before age 15 05/31/2011   Arthritis 05/31/2011   Hyperlipidemia with target LDL less than 100 05/31/2011   Hypertension 05/31/2011   Allergic rhinitis due to pollen 05/31/2011   History of colonic polyps 05/31/2011    PCP: Sharlot Gowda, MD  REFERRING PROVIDER: Willia Craze, MD  REFERRING DIAG: Closed displaced comminuted fracture of right patella with routine healing, subsequent encounter, partial patellectomy.  THERAPY DIAG:  Displaced comminuted fracture of right patella, initial encounter for closed fracture  Difficulty in walking, not elsewhere classified  Strain of right quadriceps muscle, fascia and  tendon, subsequent encounter  Stiffness of right knee, not elsewhere classified  Rationale for Evaluation and Treatment: Rehabilitation  ONSET DATE: 09/08/22  SUBJECTIVE:   SUBJECTIVE STATEMENT: "I ok"  PERTINENT HISTORY: Fell at home on corner of air mattress at [employer], landed on R knee fractured R patella, underwent surgery to debride and repair.  Has undergone home health PT, now referred to outpatient PT to continue rehab PAIN:  Are you having pain? Yes: NPRS scale: 0/10 Pain location: R ant knee Pain description: occasional twinges Aggravating factors: certain movements Relieving factors: resting  PRECAUTIONS: Other: from MD referral; Work on Right knee active range of motion and quad strengtheningWork on Right knee active range of motion and quad strengthening  WEIGHT BEARING RESTRICTIONS: No  FALLS:  Has patient fallen in last 6 months? Yes. Number of falls 1  LIVING ENVIRONMENT: Lives with: lives with their family and lives with their spouse Lives in: House/apartment Stairs: Yes: Internal: 11 steps; on right going up Has following equipment at home: Single point cane and Walker - 2 wheeled  OCCUPATION: retired  PLOF: Independent  PATIENT GOALS: return to I lifestyle  NEXT MD VISIT: December 22 2022  OBJECTIVE:   DIAGNOSTIC FINDINGS: na  PATIENT SURVEYS:  FOTO 12  COGNITION: Overall cognitive status: Within functional limits for tasks assessed     SENSATION: WFL  EDEMA:  None noted POSTURE:  weight shift left  PALPATION: Thickened R quads, distally especially at patellar attachments, patella with good multidirectional mobility  LOWER EXTREMITY ROM:all wnl except R knee flexion in sitting 79, flexion supine to 70 Extension in supine -7 01/03/23:  R knee flexion in sitting 84, supine to 80, R knee extension(supine R heel on roll) 0 01/10/23: R knee flexion 88 01/19/23: 93 flexion 01/31/23:  R knee flex 98 degrees 03/09/23: R knee AROM 0-100  LOWER  EXTREMITY MMT: R quads with 20 degree lag for LAQ, 01/19/23: 7 degree lag, MMT 4+/5 R SLR with 10 degree lag 01/19/23: SLR with 3 degree lag R hamstring 4-, 01/19/23:  R hamstring 5/5 Hip extension, abduction wnl R plantarflexors wnl     FUNCTIONAL TESTS:  Functional gait assessment: 11     GAIT: Distance walked: within department Assistive device utilized: Single point cane Level of assistance: SBA Comments: using knee brace which was sitting on top of R foot and hindering, circumduction R LE noted   TODAY'S TREATMENT:                                                                                                                              DATE:  03/23/23 Bike L 2 x 6 min STM/PROM/stretching to R LE Squats onto 4 in box on treadmill 2x5  Black bar PF 2x15 Leg press 60lb 3x12; 30lb RLE only 2x12 Step ups 4 in on two airex pads x15 each LE HS curls 35 lb x10, 25lb x15 Knee ext 10 lb 2x15    03/16/23 NuStep L 5 x 4 min Bike L 2 x 3 min  STM/PROM/stretching to R LE  LAQs with 3 sec hold 5 lb 2x10  SLS R while throwing and catching x2 CGA with min LOB  Step ups 4 in on 2 airex pads up/back with R x10; up and back with L x10 Leg press 60 lb 2x12; R only 20 lb x12 x5 STS mat table to 2 airex pads 2x15; pt favors the L hip when standing Single leg STS with R x6 min-mod A  HS curls 25 lb 2x10 Knee ext 10 lb 2x10   03/09/23 NuStep L5 x 6 min FGA Leg press 50lb 2x10, RLE 20lb 2x10 R knee/scar massage R knee PROM and contract-relax stretching HS curls 35lb x10; 25 lb x10 (dropped weight due to pt sliding forward in chair); RLE 15lb 2x10 Knee ext 10lb 2x10; RLE 5lb 3x5 Resisted gt 30lbs forward/backward/side step x3 each way  02/28/23 NuStep L5 x 6 min Leg press 50lb 3x12, Heel raises 50lb 2x15  HS cuels 35lb 2x10, RLE 15lb 2x10 Leg Ext 10lb 2x10, RLE 5lb 2x5 some assist needed LAQ RLE 3lb 2x10 Side step over foam roll ont airex 2x5 each R knee PROM    PATIENT  EDUCATION:  Education details: POC, goals Person educated: Patient and Spouse Education method: Explanation, Demonstration, Actor cues, Verbal cues, and Handouts Education comprehension: verbalized  understanding, returned demonstration, verbal cues required, tactile cues required, and needs further education  HOME EXERCISE PROGRAM: Access Code: [email or web address] URL: [email or web address] Date: 01/03/2023 Prepared by: Linton Rump Speaks  Exercises - Supine Heel Slide with Strap  - 1 x daily - 7 x weekly - 3 sets - 10 reps - Supine Straight Leg Raises  - 1 x daily - 7 x weekly - 3 sets - 10 reps - Seated Long Arc Quad  - 1 x daily - 7 x weekly - 3 sets - 10 reps - Sit to Stand  - 1 x daily - 7 x weekly - 3 sets - 10 reps - Standing Heel Raise with Support  - 1 x daily - 7 x weekly - 3 sets - 10 reps - Side Step Down with Unilateral Counter Support  - 1 x daily - 7 x weekly - 3 sets - 10 reps  - Lower Quarter Reach Combination  - 1 x daily - 7 x weekly - 3 sets - 10 reps Access Code: [email or web address] URL: [email or web address] Date: 12/29/2022 Prepared by: Debroah Baller  Exercises - Supine Heel Slide with Strap  - 1 x daily - 7 x weekly - 3 sets - 10 reps - Supine Straight Leg Raises  - 1 x daily - 7 x weekly - 3 sets - 10 reps - Seated Long Arc Quad  - 1 x daily - 7 x weekly - 3 sets - 10 reps - Sit to Stand  - 1 x daily - 7 x weekly - 3 sets - 10 reps - Standing Heel Raise with Support  - 1 x daily - 7 x weekly - 3 sets - 10 reps ASSESSMENT:  CLINICAL IMPRESSION: Patient is a 77 y.o. female who was seen today for physical therapy treatment for rehabilitation of function for R LE after a traumatic fracture R patella . Patient favors the L hip when sitting and standing from squat, so PTA cued her to put weight on her R, which she responded well to. Tactile cues were needed during leg press for proper feet placement. Dropped weight during HS curls so pt could sit back in  chair. She is progressing well with her strength and would benefit from continued PT to keep increasing her strength and her activity tolerance.  OBJECTIVE IMPAIRMENTS: Abnormal gait, decreased activity tolerance, decreased endurance, decreased mobility, difficulty walking, decreased ROM, decreased strength, increased fascial restrictions, improper body mechanics, and pain.   ACTIVITY LIMITATIONS: carrying, lifting, bending, sitting, squatting, and locomotion level  PARTICIPATION LIMITATIONS: meal prep, cleaning, laundry, driving, shopping, community activity, and yard work  PERSONAL FACTORS: Age, Time since onset of injury/illness/exacerbation, and 1 comorbidity: HTN  are also affecting patient's functional outcome.   REHAB POTENTIAL: Good  CLINICAL DECISION MAKING: Stable/uncomplicated  EVALUATION COMPLEXITY: Low   GOALS: Goals reviewed with patient? Yes  SHORT TERM GOALS: Target date: 12/29/22 I HEP Baseline:established today Goal status: IN PROGRESS  2.  Improve R knee ROM to 0- 90 Baseline: -6 to 80 Goal status: IN PROGRESS 01/03/23:  flexion to 84 today 01/10/23:  flexion to 88 today 01/19/23 flexion 92, goal met  LONG TERM GOALS: Target date:8/7 /24  Improve FGA from 11 to 28 Baseline: 11 Goal status: 27 Progressing 03/09/23   2.  Foto 61 Baseline: 54 Goal status: Met 02/23/23 67  3.  R quads strength 4+/5 for I transfers, gait efficiency, stairs, uneven terrain Baseline: 3-/5 Goal status:  02/23/23: Met 4+/5    PLAN:  PT  FREQUENCY: 1x/week  PT DURATION: 6 weeks  PLANNED INTERVENTIONS: Therapeutic exercises, Therapeutic activity, Neuromuscular re-education, Balance training, Gait training, Patient/Family education, Self Care, and Joint mobilization  PLAN FOR NEXT SESSION: progress with gentle stretching R LE, continue to address R LE strengthening open and closed chain  I agree with the above assessment and plan. Caralee Ates, PT, DPT Grayce Sessions,  PTA 03/23/2023, 11:44 AM

## 2023-03-28 ENCOUNTER — Ambulatory Visit: Payer: Medicare PPO | Admitting: Orthopedic Surgery

## 2023-03-28 DIAGNOSIS — S82041D Displaced comminuted fracture of right patella, subsequent encounter for closed fracture with routine healing: Secondary | ICD-10-CM | POA: Diagnosis not present

## 2023-03-28 NOTE — Progress Notes (Signed)
Orthopedic Surgery Progress Note     Assessment: Patient is a 77 y.o. female with right patella fracture status post partial patellectomy  Surgery date: 09/08/2022 (~7 months post-op)     Plan: -Operative plans complete -Pain control: OTC medications as needed -Weight-bear as tolerated, no brace -Return to full activity as tolerated -Return to clinic in 2 months, x-rays at next visit: none   ___________________________________________________________________________   Subjective: Patient has been doing well since she was last seen. She is at home. She is walking as much as she would like. Feels like she has been making progress with PT. Walking in regular shoes without assistive devices. Denies paresthesias and numbness.    Physical Exam:   General: no acute distress, appears stated age Neurologic: alert, answering questions appropriately, following commands Respiratory: unlabored breathing on room air, symmetric chest rise Psychiatric: appropriate affect, normal cadence to speech   MSK:    -Right lower extremity             Incision is well-healed without any evidence of infection             Can actively extend knee against gravity with 5 degree extensor lag             ROM at knee from 0 to 100 EHL/TA/GSC intact Plantarflexes and dorsiflexes toes Sensation intact to light touch in sural, saphenous, tibial, deep peroneal, and superficial peroneal nerve distributions Foot warm and well perfused   Imaging:  None obtained at today's visit   Patient name: Danielle Yoder Patient MRN: [id number] Date: 03/28/23

## 2023-03-30 ENCOUNTER — Encounter: Payer: Self-pay | Admitting: Physical Therapy

## 2023-03-30 ENCOUNTER — Ambulatory Visit: Payer: Medicare PPO | Admitting: Physical Therapy

## 2023-03-30 DIAGNOSIS — S76111D Strain of right quadriceps muscle, fascia and tendon, subsequent encounter: Secondary | ICD-10-CM

## 2023-03-30 DIAGNOSIS — S82041A Displaced comminuted fracture of right patella, initial encounter for closed fracture: Secondary | ICD-10-CM

## 2023-03-30 DIAGNOSIS — R262 Difficulty in walking, not elsewhere classified: Secondary | ICD-10-CM

## 2023-03-30 DIAGNOSIS — M25661 Stiffness of right knee, not elsewhere classified: Secondary | ICD-10-CM | POA: Diagnosis not present

## 2023-03-30 NOTE — Therapy (Signed)
OUTPATIENT PHYSICAL THERAPY LOWER EXTREMITY TREATMENT     Patient Name: Danielle Yoder MRN: [id number] DOB:[birth year], 77 y.o., female Today's Date: 03/30/2023  END OF SESSION:  PT End of Session - 03/30/23 1146     Visit Number 25    Date for PT Re-Evaluation 04/20/23    PT Start Time 1145    PT Stop Time 1230    PT Time Calculation (min) 45 min    Activity Tolerance Patient tolerated treatment well    Behavior During Therapy WFL for tasks assessed/performed                  Past Medical History:  Diagnosis Date   Allergy    RHINITIS   Arthritis    Colonic polyp    Dyslipidemia    Hemorrhoids    Hypertension    Past Surgical History:  Procedure Laterality Date   FOOT SURGERY Right    bone shaved off; 2 surgeries (recurred 8 yrs later)   FOOT SURGERY Left    bone shaved off ; 2 surgeries (recurred)   ORIF PATELLA Right 09/08/2022   Procedure: OPEN REDUCTION INTERNAL FIXATION (ORIF) PATELLA;  Surgeon: London Sheer, MD;  Location: [facility];  Service: Orthopedics;  Laterality: Right;   Patient Active Problem List   Diagnosis Date Noted   Unspecified fracture of right patella, initial encounter for closed fracture 09/07/2022   Osteopenia 02/17/2018   Family history of heart disease in female family member before age 41 05/31/2011   Arthritis 05/31/2011   Hyperlipidemia with target LDL less than 100 05/31/2011   Hypertension 05/31/2011   Allergic rhinitis due to pollen 05/31/2011   History of colonic polyps 05/31/2011    PCP: Sharlot Gowda, MD  REFERRING PROVIDER: Willia Craze, MD  REFERRING DIAG: Closed displaced comminuted fracture of right patella with routine healing, subsequent encounter, partial patellectomy.  THERAPY DIAG:  Displaced comminuted fracture of right patella, initial encounter for closed fracture  Difficulty in walking, not elsewhere classified  Strain of right quadriceps muscle, fascia and tendon, subsequent  encounter  Rationale for Evaluation and Treatment: Rehabilitation  ONSET DATE: 09/08/22  SUBJECTIVE:   SUBJECTIVE STATEMENT: Doing good, MD was pleased yesterday  PERTINENT HISTORY: Fell at home on corner of air mattress at [employer], landed on R knee fractured R patella, underwent surgery to debride and repair.  Has undergone home health PT, now referred to outpatient PT to continue rehab PAIN:  Are you having pain? Yes: NPRS scale: 0/10 Pain location: R ant knee Pain description: occasional twinges Aggravating factors: certain movements Relieving factors: resting  PRECAUTIONS: Other: from MD referral; Work on Right knee active range of motion and quad strengtheningWork on Right knee active range of motion and quad strengthening  WEIGHT BEARING RESTRICTIONS: No  FALLS:  Has patient fallen in last 6 months? Yes. Number of falls 1  LIVING ENVIRONMENT: Lives with: lives with their family and lives with their spouse Lives in: House/apartment Stairs: Yes: Internal: 11 steps; on right going up Has following equipment at home: Single point cane and Walker - 2 wheeled  OCCUPATION: retired  PLOF: Independent  PATIENT GOALS: return to I lifestyle  NEXT MD VISIT: December 22 2022  OBJECTIVE:   DIAGNOSTIC FINDINGS: na  PATIENT SURVEYS:  FOTO 15  COGNITION: Overall cognitive status: Within functional limits for tasks assessed     SENSATION: WFL  EDEMA:  None noted POSTURE: weight shift left  PALPATION: Thickened R quads, distally especially at patellar attachments,  patella with good multidirectional mobility  LOWER EXTREMITY ROM:all wnl except R knee flexion in sitting 79, flexion supine to 70 Extension in supine -7 01/03/23:  R knee flexion in sitting 84, supine to 80, R knee extension(supine R heel on roll) 0 01/10/23: R knee flexion 88 01/19/23: 93 flexion 01/31/23:  R knee flex 98 degrees 03/09/23: R knee AROM 0-100  LOWER EXTREMITY MMT: R quads with 20 degree lag  for LAQ, 01/19/23: 7 degree lag, MMT 4+/5 R SLR with 10 degree lag 01/19/23: SLR with 3 degree lag R hamstring 4-, 01/19/23:  R hamstring 5/5 Hip extension, abduction wnl R plantarflexors wnl     FUNCTIONAL TESTS:  Functional gait assessment: 11     GAIT: Distance walked: within department Assistive device utilized: Single point cane Level of assistance: SBA Comments: using knee brace which was sitting on top of R foot and hindering, circumduction R LE noted   TODAY'S TREATMENT:                                                                                                                              DATE:  03/30/23 Bike L 2 x 7 min HS curls 25lb 2x15; R only 15lb 2x10 Knee ext 10lb 2x10; R only 5lb 2x5 Step ups 8 in up with R x10; L x10 STS onto airex holding 10lb 2x15 Alternating tapping one cone while standing on airex x10; three cones tapping with L x5 CGA Leg press 70lb 2x12; R only 30lb x10  03/23/23 Bike L 2 x 6 min STM/PROM/stretching to R LE Squats onto 4 in box on treadmill 2x5  Black bar PF 2x15 Leg press 60lb 3x12; 30lb RLE only 2x12 Step ups 4 in on two airex pads x15 each LE HS curls 35 lb x10, 25lb x15 Knee ext 10 lb 2x15    03/16/23 NuStep L 5 x 4 min Bike L 2 x 3 min  STM/PROM/stretching to R LE  LAQs with 3 sec hold 5 lb 2x10  SLS R while throwing and catching x2 CGA with min LOB  Step ups 4 in on 2 airex pads up/back with R x10; up and back with L x10 Leg press 60 lb 2x12; R only 20 lb x12 x5 STS mat table to 2 airex pads 2x15; pt favors the L hip when standing Single leg STS with R x6 min-mod A  HS curls 25 lb 2x10 Knee ext 10 lb 2x10   03/09/23 NuStep L5 x 6 min FGA Leg press 50lb 2x10, RLE 20lb 2x10 R knee/scar massage R knee PROM and contract-relax stretching HS curls 35lb x10; 25 lb x10 (dropped weight due to pt sliding forward in chair); RLE 15lb 2x10 Knee ext 10lb 2x10; RLE 5lb 3x5 Resisted gt 30lbs forward/backward/side step x3 each  way  02/28/23 NuStep L5 x 6 min Leg press 50lb 3x12, Heel raises 50lb 2x15  HS cuels 35lb 2x10, RLE 15lb 2x10 Leg Ext 10lb  2x10, RLE 5lb 2x5 some assist needed LAQ RLE 3lb 2x10 Side step over foam roll ont airex 2x5 each R knee PROM    PATIENT EDUCATION:  Education details: POC, goals Person educated: Patient and Spouse Education method: Explanation, Demonstration, Tactile cues, Verbal cues, and Handouts Education comprehension: verbalized understanding, returned demonstration, verbal cues required, tactile cues required, and needs further education  HOME EXERCISE PROGRAM: Access Code: [email or web address] URL: [email or web address] Date: 01/03/2023 Prepared by: Linton Rump Speaks  Exercises - Supine Heel Slide with Strap  - 1 x daily - 7 x weekly - 3 sets - 10 reps - Supine Straight Leg Raises  - 1 x daily - 7 x weekly - 3 sets - 10 reps - Seated Long Arc Quad  - 1 x daily - 7 x weekly - 3 sets - 10 reps - Sit to Stand  - 1 x daily - 7 x weekly - 3 sets - 10 reps - Standing Heel Raise with Support  - 1 x daily - 7 x weekly - 3 sets - 10 reps - Side Step Down with Unilateral Counter Support  - 1 x daily - 7 x weekly - 3 sets - 10 reps  - Lower Quarter Reach Combination  - 1 x daily - 7 x weekly - 3 sets - 10 reps Access Code: [email or web address] URL: [email or web address] Date: 12/29/2022 Prepared by: Debroah Baller  Exercises - Supine Heel Slide with Strap  - 1 x daily - 7 x weekly - 3 sets - 10 reps - Supine Straight Leg Raises  - 1 x daily - 7 x weekly - 3 sets - 10 reps - Seated Long Arc Quad  - 1 x daily - 7 x weekly - 3 sets - 10 reps - Sit to Stand  - 1 x daily - 7 x weekly - 3 sets - 10 reps - Standing Heel Raise with Support  - 1 x daily - 7 x weekly - 3 sets - 10 reps ASSESSMENT:  CLINICAL IMPRESSION: Patient entered feeling no pain and reporting that her MD was pleased yesterday. She is progressing with her R knee strength. Verbal cues needed during eccentric  portion of step ups to control and go slow, which was demonstrated correctly. Pt required CGA during L foot cone tapping while R foot was on airex. Overall pt tolerated treatment well and would benefit from continued PT to further increase her R knee strength and ROM.   OBJECTIVE IMPAIRMENTS: Abnormal gait, decreased activity tolerance, decreased endurance, decreased mobility, difficulty walking, decreased ROM, decreased strength, increased fascial restrictions, improper body mechanics, and pain.   ACTIVITY LIMITATIONS: carrying, lifting, bending, sitting, squatting, and locomotion level  PARTICIPATION LIMITATIONS: meal prep, cleaning, laundry, driving, shopping, community activity, and yard work  PERSONAL FACTORS: Age, Time since onset of injury/illness/exacerbation, and 1 comorbidity: HTN  are also affecting patient's functional outcome.   REHAB POTENTIAL: Good  CLINICAL DECISION MAKING: Stable/uncomplicated  EVALUATION COMPLEXITY: Low   GOALS: Goals reviewed with patient? Yes  SHORT TERM GOALS: Target date: 12/29/22 I HEP Baseline:established today Goal status: IN PROGRESS  2.  Improve R knee ROM to 0- 90 Baseline: -6 to 80 Goal status: IN PROGRESS 01/03/23:  flexion to 84 today 01/10/23:  flexion to 88 today 01/19/23 flexion 92, goal met  LONG TERM GOALS: Target date:8/7 /24  Improve FGA from 11 to 28 Baseline: 11 Goal status: 27 Progressing 03/09/23   2.  Foto 61 Baseline: 54 Goal status: Met 02/23/23 67  3.  R quads strength 4+/5 for I transfers, gait efficiency, stairs, uneven terrain Baseline: 3-/5 Goal status:  02/23/23: Met 4+/5    PLAN:  PT FREQUENCY: 1x/week  PT DURATION: 6 weeks  PLANNED INTERVENTIONS: Therapeutic exercises, Therapeutic activity, Neuromuscular re-education, Balance training, Gait training, Patient/Family education, Self Care, and Joint mobilization  PLAN FOR NEXT SESSION: progress with gentle stretching R LE, continue to address R LE  strengthening open and closed chain  I agree with the above assessment and plan. Caralee Ates, PT, DPT Grayce Sessions, PTA 03/30/2023, 11:49 AM

## 2023-04-06 ENCOUNTER — Ambulatory Visit: Payer: Medicare PPO

## 2023-04-06 ENCOUNTER — Other Ambulatory Visit: Payer: Self-pay

## 2023-04-06 DIAGNOSIS — R262 Difficulty in walking, not elsewhere classified: Secondary | ICD-10-CM

## 2023-04-06 DIAGNOSIS — S76111D Strain of right quadriceps muscle, fascia and tendon, subsequent encounter: Secondary | ICD-10-CM

## 2023-04-06 DIAGNOSIS — M25661 Stiffness of right knee, not elsewhere classified: Secondary | ICD-10-CM

## 2023-04-06 DIAGNOSIS — S82041A Displaced comminuted fracture of right patella, initial encounter for closed fracture: Secondary | ICD-10-CM | POA: Diagnosis not present

## 2023-04-06 NOTE — Therapy (Signed)
OUTPATIENT PHYSICAL THERAPY LOWER EXTREMITY TREATMENT    Progress Note Reporting Period 02/23/23 to 04/06/23  See note below for Objective Data and Assessment of Progress/Goals.     Patient Name: Danielle Yoder MRN: [id number] DOB:[birth year], 77 y.o., female Today's Date: 04/06/2023  END OF SESSION:  PT End of Session - 04/06/23 1259     Visit Number 26    Date for PT Re-Evaluation 04/20/23    Progress Note Due on Visit 20                   Past Medical History:  Diagnosis Date   Allergy    RHINITIS   Arthritis    Colonic polyp    Dyslipidemia    Hemorrhoids    Hypertension    Past Surgical History:  Procedure Laterality Date   FOOT SURGERY Right    bone shaved off; 2 surgeries (recurred 8 yrs later)   FOOT SURGERY Left    bone shaved off ; 2 surgeries (recurred)   ORIF PATELLA Right 09/08/2022   Procedure: OPEN REDUCTION INTERNAL FIXATION (ORIF) PATELLA;  Surgeon: London Sheer, MD;  Location: [facility];  Service: Orthopedics;  Laterality: Right;   Patient Active Problem List   Diagnosis Date Noted   Unspecified fracture of right patella, initial encounter for closed fracture 09/07/2022   Osteopenia 02/17/2018   Family history of heart disease in female family member before age 28 05/31/2011   Arthritis 05/31/2011   Hyperlipidemia with target LDL less than 100 05/31/2011   Hypertension 05/31/2011   Allergic rhinitis due to pollen 05/31/2011   History of colonic polyps 05/31/2011    PCP: Sharlot Gowda, MD  REFERRING PROVIDER: Willia Craze, MD  REFERRING DIAG: Closed displaced comminuted fracture of right patella with routine healing, subsequent encounter, partial patellectomy.  THERAPY DIAG:  Displaced comminuted fracture of right patella, initial encounter for closed fracture  Difficulty in walking, not elsewhere classified  Strain of right quadriceps muscle, fascia and tendon, subsequent encounter  Stiffness of right knee, not elsewhere  classified  Rationale for Evaluation and Treatment: Rehabilitation  ONSET DATE: 09/08/22  SUBJECTIVE:   SUBJECTIVE STATEMENT: Doing good, MD was pleased yesterday  PERTINENT HISTORY: Fell at home on corner of air mattress at [employer], landed on R knee fractured R patella, underwent surgery to debride and repair.  Has undergone home health PT, now referred to outpatient PT to continue rehab PAIN:  Are you having pain? Yes: NPRS scale: 0/10 Pain location: R ant knee Pain description: occasional twinges Aggravating factors: certain movements Relieving factors: resting  PRECAUTIONS: Other: from MD referral; Work on Right knee active range of motion and quad strengtheningWork on Right knee active range of motion and quad strengthening  WEIGHT BEARING RESTRICTIONS: No  FALLS:  Has patient fallen in last 6 months? Yes. Number of falls 1  LIVING ENVIRONMENT: Lives with: lives with their family and lives with their spouse Lives in: House/apartment Stairs: Yes: Internal: 11 steps; on right going up Has following equipment at home: Single point cane and Walker - 2 wheeled  OCCUPATION: retired  PLOF: Independent  PATIENT GOALS: return to I lifestyle  NEXT MD VISIT: December 22 2022  OBJECTIVE:   DIAGNOSTIC FINDINGS: na  PATIENT SURVEYS:  FOTO 67  COGNITION: Overall cognitive status: Within functional limits for tasks assessed     SENSATION: WFL  EDEMA:  None noted POSTURE: weight shift left  PALPATION: Thickened R quads, distally especially at patellar attachments, patella with good  multidirectional mobility  LOWER EXTREMITY ROM:all wnl except R knee flexion in sitting 79, flexion supine to 70 Extension in supine -7 01/03/23:  R knee flexion in sitting 84, supine to 80, R knee extension(supine R heel on roll) 0 01/10/23: R knee flexion 88 01/19/23: 93 flexion 01/31/23:  R knee flex 98 degrees 03/09/23: R knee AROM 0-100  LOWER EXTREMITY MMT: R quads with 20 degree  lag for LAQ, 01/19/23: 7 degree lag, MMT 4+/5 R SLR with 10 degree lag 01/19/23: SLR with 3 degree lag R hamstring 4-, 01/19/23:  R hamstring 5/5 Hip extension, abduction wnl R plantarflexors wnl     FUNCTIONAL TESTS:  Functional gait assessment: 11     GAIT: Distance walked: within department Assistive device utilized: Single point cane Level of assistance: SBA Comments: using knee brace which was sitting on top of R foot and hindering, circumduction R LE noted   TODAY'S TREATMENT:                                                                                                                              DATE:  04/06/23:  Bike level 2 x 7 min Assessed unilateral balancing:  26 sec L, 20 sec R, increased sway on R Unilateral heel raises 15 x each leg R LE unilateral heel raises with forefoot on book, 15 x Deep lunge walks, with mat table for support, 8 x length of mat Lateral heel taps each leg from 6" step, with UE support to fatigue, 10 x 2 sets each side   03/30/23 Bike L 2 x 7 min HS curls 25lb 2x15; R only 15lb 2x10 Knee ext 10lb 2x10; R only 5lb 2x5 Step ups 8 in up with R x10; L x10 STS onto airex holding 10lb 2x15 Alternating tapping one cone while standing on airex x10; three cones tapping with L x5 CGA Leg press 70lb 2x12; R only 30lb x10  03/23/23 Bike L 2 x 6 min STM/PROM/stretching to R LE Squats onto 4 in box on treadmill 2x5  Black bar PF 2x15 Leg press 60lb 3x12; 30lb RLE only 2x12 Step ups 4 in on two airex pads x15 each LE HS curls 35 lb x10, 25lb x15 Knee ext 10 lb 2x15    03/16/23 NuStep L 5 x 4 min Bike L 2 x 3 min  STM/PROM/stretching to R LE  LAQs with 3 sec hold 5 lb 2x10  SLS R while throwing and catching x2 CGA with min LOB  Step ups 4 in on 2 airex pads up/back with R x10; up and back with L x10 Leg press 60 lb 2x12; R only 20 lb x12 x5 STS mat table to 2 airex pads 2x15; pt favors the L hip when standing Single leg STS with R x6 min-mod A   HS curls 25 lb 2x10 Knee ext 10 lb 2x10   03/09/23 NuStep L5 x 6 min FGA Leg press 50lb 2x10, RLE  20lb 2x10 R knee/scar massage R knee PROM and contract-relax stretching HS curls 35lb x10; 25 lb x10 (dropped weight due to pt sliding forward in chair); RLE 15lb 2x10 Knee ext 10lb 2x10; RLE 5lb 3x5 Resisted gt 30lbs forward/backward/side step x3 each way  02/28/23 NuStep L5 x 6 min Leg press 50lb 3x12, Heel raises 50lb 2x15  HS cuels 35lb 2x10, RLE 15lb 2x10 Leg Ext 10lb 2x10, RLE 5lb 2x5 some assist needed LAQ RLE 3lb 2x10 Side step over foam roll ont airex 2x5 each R knee PROM    PATIENT EDUCATION:  Education details: POC, goals Person educated: Patient and Spouse Education method: Explanation, Demonstration, Tactile cues, Verbal cues, and Handouts Education comprehension: verbalized understanding, returned demonstration, verbal cues required, tactile cues required, and needs further education  HOME EXERCISE PROGRAM: Access Code: [email or web address] URL: [email or web address] Date: 01/03/2023 Prepared by: Linton Rump Djon Tith  Exercises - Supine Heel Slide with Strap  - 1 x daily - 7 x weekly - 3 sets - 10 reps - Supine Straight Leg Raises  - 1 x daily - 7 x weekly - 3 sets - 10 reps - Seated Long Arc Quad  - 1 x daily - 7 x weekly - 3 sets - 10 reps - Sit to Stand  - 1 x daily - 7 x weekly - 3 sets - 10 reps - Standing Heel Raise with Support  - 1 x daily - 7 x weekly - 3 sets - 10 reps - Side Step Down with Unilateral Counter Support  - 1 x daily - 7 x weekly - 3 sets - 10 reps  - Lower Quarter Reach Combination  - 1 x daily - 7 x weekly - 3 sets - 10 reps Access Code: [email or web address] URL: [email or web address] Date: 12/29/2022 Prepared by: Debroah Baller  Exercises - Supine Heel Slide with Strap  - 1 x daily - 7 x weekly - 3 sets - 10 reps - Supine Straight Leg Raises  - 1 x daily - 7 x weekly - 3 sets - 10 reps - Seated Long Arc Quad  - 1 x daily - 7 x  weekly - 3 sets - 10 reps - Sit to Stand  - 1 x daily - 7 x weekly - 3 sets - 10 reps - Standing Heel Raise with Support  - 1 x daily - 7 x weekly - 3 sets - 10 reps ASSESSMENT:  CLINICAL IMPRESSION: Patient entered feeling no pain.  Reassessed R knee flexion which continues to improve.  Assessed balance, functional strength R LE.  Noted some high level deficits R plantarflexors and R quads. Recommended specific training for these muscles for patient.   Overall pt tolerated treatment well and would benefit from continued PT to further increase her R knee strength and ROM.   OBJECTIVE IMPAIRMENTS: Abnormal gait, decreased activity tolerance, decreased endurance, decreased mobility, difficulty walking, decreased ROM, decreased strength, increased fascial restrictions, improper body mechanics, and pain.   ACTIVITY LIMITATIONS: carrying, lifting, bending, sitting, squatting, and locomotion level  PARTICIPATION LIMITATIONS: meal prep, cleaning, laundry, driving, shopping, community activity, and yard work  PERSONAL FACTORS: Age, Time since onset of injury/illness/exacerbation, and 1 comorbidity: HTN  are also affecting patient's functional outcome.   REHAB POTENTIAL: Good  CLINICAL DECISION MAKING: Stable/uncomplicated  EVALUATION COMPLEXITY: Low   GOALS: Goals reviewed with patient? Yes  SHORT TERM GOALS: Target date: 12/29/22 I HEP Baseline:established today Goal status: IN PROGRESS  2.  Improve R knee ROM to 0- 90 Baseline: -6  to 80 Goal status: IN PROGRESS 01/03/23:  flexion to 84 today 01/10/23:  flexion to 88 today 01/19/23 flexion 92, goal met 04/06/23: 106  goal met  LONG TERM GOALS: Target date:8/7 /24  Improve FGA from 11 to 28 Baseline: 11 Goal status: 27 Progressing 03/09/23   2.  Foto 61 Baseline: 54 Goal status: Met 02/23/23 67  3.  R quads strength 4+/5 for I transfers, gait efficiency, stairs, uneven terrain Baseline: 3-/5 Goal status:  02/23/23: Met 4+/5 04/06/23:   quads, hip flexors 5/5 with burst testing, R plantarflexors 4+/5 Also decreased control/ strength with deep squatting R leg as compared to L  PLAN:  PT FREQUENCY: 1x/week  PT DURATION: 6 weeks  PLANNED INTERVENTIONS: Therapeutic exercises, Therapeutic activity, Neuromuscular re-education, Balance training, Gait training, Patient/Family education, Self Care, and Joint mobilization  PLAN FOR NEXT SESSION: patient plans DC next session Zania Kalisz L Diezel Mazur, PT, DPT [facility] 04/06/2023, 2:50 PM

## 2023-04-13 ENCOUNTER — Ambulatory Visit: Payer: Medicare PPO | Admitting: Physical Therapy

## 2023-04-13 ENCOUNTER — Encounter: Payer: Self-pay | Admitting: Physical Therapy

## 2023-04-13 DIAGNOSIS — S82041A Displaced comminuted fracture of right patella, initial encounter for closed fracture: Secondary | ICD-10-CM | POA: Diagnosis not present

## 2023-04-13 DIAGNOSIS — M25661 Stiffness of right knee, not elsewhere classified: Secondary | ICD-10-CM

## 2023-04-13 DIAGNOSIS — S76111D Strain of right quadriceps muscle, fascia and tendon, subsequent encounter: Secondary | ICD-10-CM | POA: Diagnosis not present

## 2023-04-13 DIAGNOSIS — R262 Difficulty in walking, not elsewhere classified: Secondary | ICD-10-CM | POA: Diagnosis not present

## 2023-04-13 NOTE — Therapy (Unsigned)
OUTPATIENT PHYSICAL THERAPY LOWER EXTREMITY TREATMENT      Patient Name: Danielle Yoder MRN: [id number] DOB:[birth year], 77 y.o., female Today's Date: 04/13/2023  END OF SESSION:  PT End of Session - 04/13/23 1145     Visit Number 27    Date for PT Re-Evaluation 04/20/23    PT Start Time 1145    PT Stop Time 1230    PT Time Calculation (min) 45 min    Activity Tolerance Patient tolerated treatment well    Behavior During Therapy WFL for tasks assessed/performed                   Past Medical History:  Diagnosis Date   Allergy    RHINITIS   Arthritis    Colonic polyp    Dyslipidemia    Hemorrhoids    Hypertension    Past Surgical History:  Procedure Laterality Date   FOOT SURGERY Right    bone shaved off; 2 surgeries (recurred 8 yrs later)   FOOT SURGERY Left    bone shaved off ; 2 surgeries (recurred)   ORIF PATELLA Right 09/08/2022   Procedure: OPEN REDUCTION INTERNAL FIXATION (ORIF) PATELLA;  Surgeon: London Sheer, MD;  Location: [facility];  Service: Orthopedics;  Laterality: Right;   Patient Active Problem List   Diagnosis Date Noted   Unspecified fracture of right patella, initial encounter for closed fracture 09/07/2022   Osteopenia 02/17/2018   Family history of heart disease in female family member before age 34 05/31/2011   Arthritis 05/31/2011   Hyperlipidemia with target LDL less than 100 05/31/2011   Hypertension 05/31/2011   Allergic rhinitis due to pollen 05/31/2011   History of colonic polyps 05/31/2011    PCP: Sharlot Gowda, MD  REFERRING PROVIDER: Willia Craze, MD  REFERRING DIAG: Closed displaced comminuted fracture of right patella with routine healing, subsequent encounter, partial patellectomy.  THERAPY DIAG:  Displaced comminuted fracture of right patella, initial encounter for closed fracture  Difficulty in walking, not elsewhere classified  Strain of right quadriceps muscle, fascia and tendon, subsequent  encounter  Stiffness of right knee, not elsewhere classified  Rationale for Evaluation and Treatment: Rehabilitation  ONSET DATE: 09/08/22  SUBJECTIVE:   SUBJECTIVE STATEMENT: Doing good, MD was pleased yesterday  PERTINENT HISTORY: Fell at home on corner of air mattress at [employer], landed on R knee fractured R patella, underwent surgery to debride and repair.  Has undergone home health PT, now referred to outpatient PT to continue rehab PAIN:  Are you having pain? Yes: NPRS scale: 0/10 Pain location: R ant knee Pain description: occasional twinges Aggravating factors: certain movements Relieving factors: resting  PRECAUTIONS: Other: from MD referral; Work on Right knee active range of motion and quad strengtheningWork on Right knee active range of motion and quad strengthening  WEIGHT BEARING RESTRICTIONS: No  FALLS:  Has patient fallen in last 6 months? Yes. Number of falls 1  LIVING ENVIRONMENT: Lives with: lives with their family and lives with their spouse Lives in: House/apartment Stairs: Yes: Internal: 11 steps; on right going up Has following equipment at home: Single point cane and Walker - 2 wheeled  OCCUPATION: retired  PLOF: Independent  PATIENT GOALS: return to I lifestyle  NEXT MD VISIT: December 22 2022  OBJECTIVE:   DIAGNOSTIC FINDINGS: na  PATIENT SURVEYS:  FOTO 71  COGNITION: Overall cognitive status: Within functional limits for tasks assessed     SENSATION: WFL  EDEMA:  None noted POSTURE: weight shift left  PALPATION: Thickened R quads, distally especially at patellar attachments, patella with good multidirectional mobility  LOWER EXTREMITY ROM:all wnl except R knee flexion in sitting 79, flexion supine to 70 Extension in supine -7 01/03/23:  R knee flexion in sitting 84, supine to 80, R knee extension(supine R heel on roll) 0 01/10/23: R knee flexion 88 01/19/23: 93 flexion 01/31/23:  R knee flex 98 degrees 03/09/23: R knee AROM  0-100  LOWER EXTREMITY MMT: R quads with 20 degree lag for LAQ, 01/19/23: 7 degree lag, MMT 4+/5 R SLR with 10 degree lag 01/19/23: SLR with 3 degree lag R hamstring 4-, 01/19/23:  R hamstring 5/5 Hip extension, abduction wnl R plantarflexors wnl     FUNCTIONAL TESTS:  Functional gait assessment: 11     GAIT: Distance walked: within department Assistive device utilized: Single point cane Level of assistance: SBA Comments: using knee brace which was sitting on top of R foot and hindering, circumduction R LE noted   TODAY'S TREATMENT:                                                                                                                              DATE:  04/13/23 NuStep L5 x 6 min L only SLS 3x10'' each R knee AROM 0-100 S2S holding blue ball  HS curls 25lb 2x12 Leg ext 10lb 2x10 Heel raises black bar 2x10 Lateral 6in step ups  04/06/23:  Bike level 2 x 7 min Assessed unilateral balancing:  26 sec L, 20 sec R, increased sway on R Unilateral heel raises 15 x each leg R LE unilateral heel raises with forefoot on book, 15 x Deep lunge walks, with mat table for support, 8 x length of mat Lateral heel taps each leg from 6" step, with UE support to fatigue, 10 x 2 sets each side   03/30/23 Bike L 2 x 7 min HS curls 25lb 2x15; R only 15lb 2x10 Knee ext 10lb 2x10; R only 5lb 2x5 Step ups 8 in up with R x10; L x10 STS onto airex holding 10lb 2x15 Alternating tapping one cone while standing on airex x10; three cones tapping with L x5 CGA Leg press 70lb 2x12; R only 30lb x10  03/23/23 Bike L 2 x 6 min STM/PROM/stretching to R LE Squats onto 4 in box on treadmill 2x5  Black bar PF 2x15 Leg press 60lb 3x12; 30lb RLE only 2x12 Step ups 4 in on two airex pads x15 each LE HS curls 35 lb x10, 25lb x15 Knee ext 10 lb 2x15    03/16/23 NuStep L 5 x 4 min Bike L 2 x 3 min  STM/PROM/stretching to R LE  LAQs with 3 sec hold 5 lb 2x10  SLS R while throwing and catching x2 CGA  with min LOB  Step ups 4 in on 2 airex pads up/back with R x10; up and back with L x10 Leg press 60 lb 2x12; R only 20 lb  x12 x5 STS mat table to 2 airex pads 2x15; pt favors the L hip when standing Single leg STS with R x6 min-mod A  HS curls 25 lb 2x10 Knee ext 10 lb 2x10   03/09/23 NuStep L5 x 6 min FGA Leg press 50lb 2x10, RLE 20lb 2x10 R knee/scar massage R knee PROM and contract-relax stretching HS curls 35lb x10; 25 lb x10 (dropped weight due to pt sliding forward in chair); RLE 15lb 2x10 Knee ext 10lb 2x10; RLE 5lb 3x5 Resisted gt 30lbs forward/backward/side step x3 each way  02/28/23 NuStep L5 x 6 min Leg press 50lb 3x12, Heel raises 50lb 2x15  HS cuels 35lb 2x10, RLE 15lb 2x10 Leg Ext 10lb 2x10, RLE 5lb 2x5 some assist needed LAQ RLE 3lb 2x10 Side step over foam roll ont airex 2x5 each R knee PROM    PATIENT EDUCATION:  Education details: POC, goals Person educated: Patient and Spouse Education method: Explanation, Demonstration, Tactile cues, Verbal cues, and Handouts Education comprehension: verbalized understanding, returned demonstration, verbal cues required, tactile cues required, and needs further education  HOME EXERCISE PROGRAM: Access Code: [email or web address] URL: [email or web address] Date: 01/03/2023 Prepared by: Linton Rump Speaks  Exercises - Supine Heel Slide with Strap  - 1 x daily - 7 x weekly - 3 sets - 10 reps - Supine Straight Leg Raises  - 1 x daily - 7 x weekly - 3 sets - 10 reps - Seated Long Arc Quad  - 1 x daily - 7 x weekly - 3 sets - 10 reps - Sit to Stand  - 1 x daily - 7 x weekly - 3 sets - 10 reps - Standing Heel Raise with Support  - 1 x daily - 7 x weekly - 3 sets - 10 reps - Side Step Down with Unilateral Counter Support  - 1 x daily - 7 x weekly - 3 sets - 10 reps  - Lower Quarter Reach Combination  - 1 x daily - 7 x weekly - 3 sets - 10 reps Access Code: [email or web address] URL: [email or web address] Date:  12/29/2022 Prepared by: Debroah Baller  Exercises - Supine Heel Slide with Strap  - 1 x daily - 7 x weekly - 3 sets - 10 reps - Supine Straight Leg Raises  - 1 x daily - 7 x weekly - 3 sets - 10 reps - Seated Long Arc Quad  - 1 x daily - 7 x weekly - 3 sets - 10 reps - Sit to Stand  - 1 x daily - 7 x weekly - 3 sets - 10 reps - Standing Heel Raise with Support  - 1 x daily - 7 x weekly - 3 sets - 10 reps ASSESSMENT:  CLINICAL IMPRESSION: Patient entered feeling no pain and doing well. She is pleased with her current functional status despite having some limitation with R knee flexion. All interventions completed well. Cues for eccentric control with HS curls. Pt reports no functional limitations..   OBJECTIVE IMPAIRMENTS: Abnormal gait, decreased activity tolerance, decreased endurance, decreased mobility, difficulty walking, decreased ROM, decreased strength, increased fascial restrictions, improper body mechanics, and pain.   ACTIVITY LIMITATIONS: carrying, lifting, bending, sitting, squatting, and locomotion level  PARTICIPATION LIMITATIONS: meal prep, cleaning, laundry, driving, shopping, community activity, and yard work  PERSONAL FACTORS: Age, Time since onset of injury/illness/exacerbation, and 1 comorbidity: HTN  are also affecting patient's functional outcome.   REHAB POTENTIAL: Good  CLINICAL DECISION MAKING: Stable/uncomplicated  EVALUATION COMPLEXITY: Low   GOALS: Goals reviewed with patient?  Yes  SHORT TERM GOALS: Target date: 12/29/22 I HEP Baseline:established today Goal status: IN PROGRESS  2.  Improve R knee ROM to 0- 90 Baseline: -6 to 80 Goal status: IN PROGRESS 01/03/23:  flexion to 84 today 01/10/23:  flexion to 88 today 01/19/23 flexion 92, goal met 04/06/23: 106  goal met  LONG TERM GOALS: Target date:8/7 /24  Improve FGA from 11 to 28 Baseline: 11 Goal status: 27 Progressing 03/09/23   2.  Foto 61 Baseline: 54 Goal status: Met 02/23/23 67  3.  R  quads strength 4+/5 for I transfers, gait efficiency, stairs, uneven terrain Baseline: 3-/5 Goal status:  02/23/23: Met 4+/5 04/06/23:  quads, hip flexors 5/5 with burst testing, R plantarflexors 4+/5 Also decreased control/ strength with deep squatting R leg as compared to L  PLAN:  PT FREQUENCY: 1x/week  PT DURATION: 6 weeks  PLANNED INTERVENTIONS: Therapeutic exercises, Therapeutic activity, Neuromuscular re-education, Balance training, Gait training, Patient/Family education, Self Care, and Joint mobilization  PLAN FOR NEXT SESSION:DC next session  PHYSICAL THERAPY DISCHARGE SUMMARY  Visits from Start of Care: 27  Patient agrees to discharge. Patient goals were partially met. Patient is being discharged due to being pleased with the current functional level.  Grayce Sessions, PTA,  04/13/2023, 11:45 AM

## 2023-04-20 ENCOUNTER — Ambulatory Visit: Payer: Medicare PPO | Admitting: Physical Therapy

## 2023-05-17 DIAGNOSIS — H43813 Vitreous degeneration, bilateral: Secondary | ICD-10-CM | POA: Diagnosis not present

## 2023-05-17 DIAGNOSIS — H25813 Combined forms of age-related cataract, bilateral: Secondary | ICD-10-CM | POA: Diagnosis not present

## 2023-05-17 DIAGNOSIS — H43391 Other vitreous opacities, right eye: Secondary | ICD-10-CM | POA: Diagnosis not present

## 2023-05-30 ENCOUNTER — Ambulatory Visit: Payer: Medicare PPO | Admitting: Orthopedic Surgery

## 2023-06-01 ENCOUNTER — Ambulatory Visit: Payer: Medicare PPO | Admitting: Orthopedic Surgery

## 2023-06-01 DIAGNOSIS — S82041D Displaced comminuted fracture of right patella, subsequent encounter for closed fracture with routine healing: Secondary | ICD-10-CM

## 2023-06-01 NOTE — Progress Notes (Signed)
Orthopedic Surgery Progress Note     Assessment: Patient is a 77 y.o. female with right patella fracture status post partial patellectomy  Surgery date: 09/08/2022 (~9 months post-op)     Plan: -Operative plans complete -Pain control: OTC medications as needed -Return to full activity as tolerated -Continue to do home exercise program -Return to clinic in 3 months, x-rays at next visit: none   ___________________________________________________________________________   Subjective:  Patient continues to do well. Ambulating without assistive devices. Sometimes has stiffness in the and that resolves if she works it out. No other complaints. Has continued to do her home exercises.    Physical Exam:   General: no acute distress, appears stated age Neurologic: alert, answering questions appropriately, following commands Respiratory: unlabored breathing on room air, symmetric chest rise Psychiatric: appropriate affect, normal cadence to speech   MSK:    -Right lower extremity             Incision is well healed             ROM at knee from 0 to 110 EHL/TA/GSC intact Plantarflexes and dorsiflexes toes Sensation intact to light touch in sural, saphenous, tibial, deep peroneal, and superficial peroneal nerve distributions Foot warm and well perfused   Imaging:  None obtained at today's visit   Patient name: Danielle Yoder Patient MRN: [id number] Date: 06/01/23

## 2023-06-02 ENCOUNTER — Telehealth: Payer: Self-pay | Admitting: Physical Medicine and Rehabilitation

## 2023-06-02 NOTE — Telephone Encounter (Signed)
Received call from patient. She would like to get copy of records from treatment with Dr. Alvester Morin. She would to get today. I advised I would have ready for her today and she will sign authorization when she comes in to pick up.

## 2023-07-11 ENCOUNTER — Other Ambulatory Visit: Payer: Medicare PPO

## 2023-07-11 ENCOUNTER — Telehealth: Payer: Self-pay | Admitting: Family Medicine

## 2023-07-11 NOTE — Telephone Encounter (Signed)
Pt is wanting to know if she is up to date with her pneumonia shots?

## 2023-07-11 NOTE — Telephone Encounter (Signed)
Notified wife and schedule her and husband for prevnar 20

## 2023-07-12 ENCOUNTER — Other Ambulatory Visit (INDEPENDENT_AMBULATORY_CARE_PROVIDER_SITE_OTHER): Payer: Medicare PPO

## 2023-07-12 DIAGNOSIS — Z23 Encounter for immunization: Secondary | ICD-10-CM | POA: Diagnosis not present

## 2023-08-25 ENCOUNTER — Ambulatory Visit: Payer: Medicare PPO | Admitting: Orthopedic Surgery

## 2023-08-25 DIAGNOSIS — S82041D Displaced comminuted fracture of right patella, subsequent encounter for closed fracture with routine healing: Secondary | ICD-10-CM

## 2023-08-25 NOTE — Progress Notes (Signed)
Orthopedic Surgery Progress Note     Assessment: Patient is a 77 y.o. female with right patella fracture status post partial patellectomy  Surgery date: 09/08/2022 (~12 months post-op)     Plan: -Operative plans complete -Pain control: OTC medications as needed -Return to full activity as tolerated -Return to clinic on an as needed basis   ___________________________________________________________________________   Subjective:  Patient has been doing well since she was last seen.  She has had some knee soreness with the change in the weather.  She is only using Tylenol to control the pain.  She is not limited in any activities she wants to do.  She is pleased with her surgical outcome at this time.   Physical Exam:   General: no acute distress, appears stated age Neurologic: alert, answering questions appropriately, following commands Respiratory: unlabored breathing on room air, symmetric chest rise Psychiatric: appropriate affect, normal cadence to speech   MSK:    -Right lower extremity             Incision is well healed             ROM at knee from 0 to 110 EHL/TA/GSC intact Plantarflexes and dorsiflexes toes Sensation intact to light touch in sural, saphenous, tibial, deep peroneal, and superficial peroneal nerve distributions Foot warm and well perfused   Imaging:  None obtained at today's visit    Patient name: Danielle Yoder Patient MRN: [id number] Date: 08/25/23

## 2023-11-01 ENCOUNTER — Other Ambulatory Visit: Payer: Self-pay | Admitting: Family Medicine

## 2023-11-01 DIAGNOSIS — Z1231 Encounter for screening mammogram for malignant neoplasm of breast: Secondary | ICD-10-CM

## 2023-11-08 ENCOUNTER — Encounter: Payer: Self-pay | Admitting: Internal Medicine

## 2023-11-08 NOTE — Telephone Encounter (Unsigned)
 Copied from CRM [id number]. Topic: General - Other >> Nov 08, 2023 10:54 AM Kristie Cowman wrote: Reason for CRM: Patient called in stating that she received her COVID-10 shot and does not see it updated in her chart.  She would like that information added.  [facility] 05/23/2023 Hazle Coca

## 2023-11-29 ENCOUNTER — Telehealth: Payer: Self-pay | Admitting: Family Medicine

## 2023-11-29 NOTE — Telephone Encounter (Signed)
 This RN spoke to pt and advised that per her chart it appears her last Covid booster was 09/23, pt reports she believes this is accurate. Advised pt she could receive a booster if she would like, she reports she gets all of her vaccines at [facility]. This RN encouraged pt to also discuss vaccine with pharmacist prior to administration if she had additional questions. Pt verbalized understanding and agrees to plan.  Copied from CRM [id number]. Topic: Clinical - Medical Advice >> Nov 29, 2023  4:35 PM Higinio Roger wrote: Reason for CRM: Patient states she received a text from [employer] stating there was a new covid shot out and that she may be eligible. She is wanting to know if she should take it or if it time for another one.    Callback #: [phone]

## 2023-12-12 ENCOUNTER — Ambulatory Visit
Admission: RE | Admit: 2023-12-12 | Discharge: 2023-12-12 | Disposition: A | Discharge status: Home / Self Care | Payer: Medicare PPO | Source: Ambulatory Visit | Attending: Family Medicine | Admitting: Family Medicine

## 2023-12-12 DIAGNOSIS — Z1231 Encounter for screening mammogram for malignant neoplasm of breast: Secondary | ICD-10-CM | POA: Diagnosis not present

## 2023-12-23 IMAGING — DX DG LUMBAR SPINE COMPLETE 4+V
5 series · 5 of 5 positions shown · non-contrast
Comparison: None.

CLINICAL DATA: Fall, back pain

EXAM:
LUMBAR SPINE - COMPLETE 4+ VIEW

[l-spine ap]
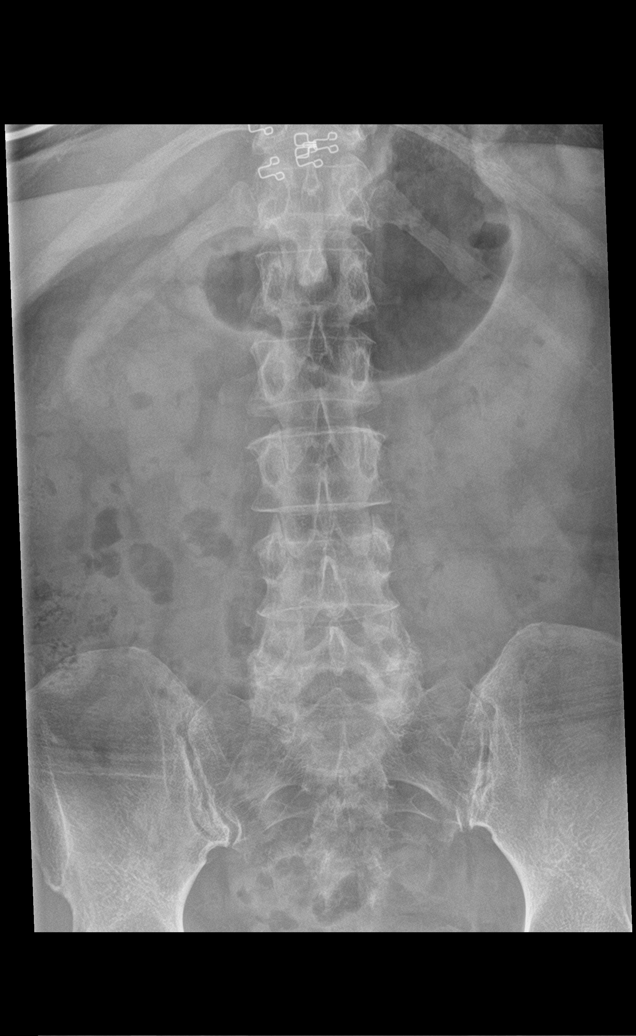

[l-spine obl (1 of 2)]
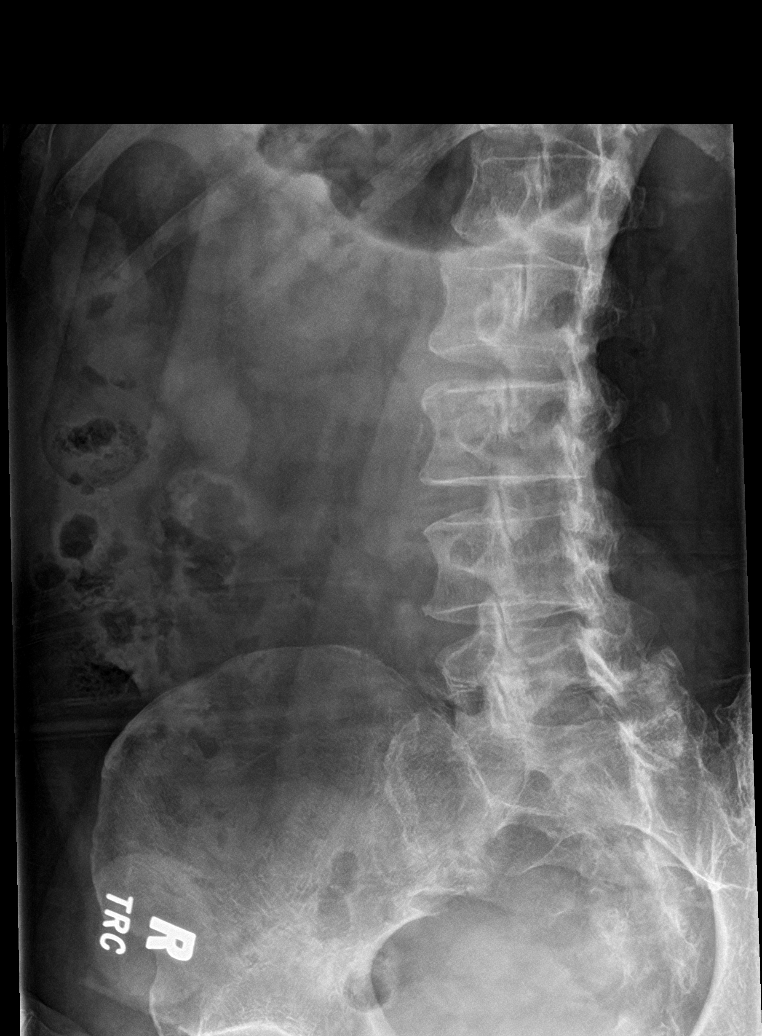

[l-spine obl (2 of 2)]
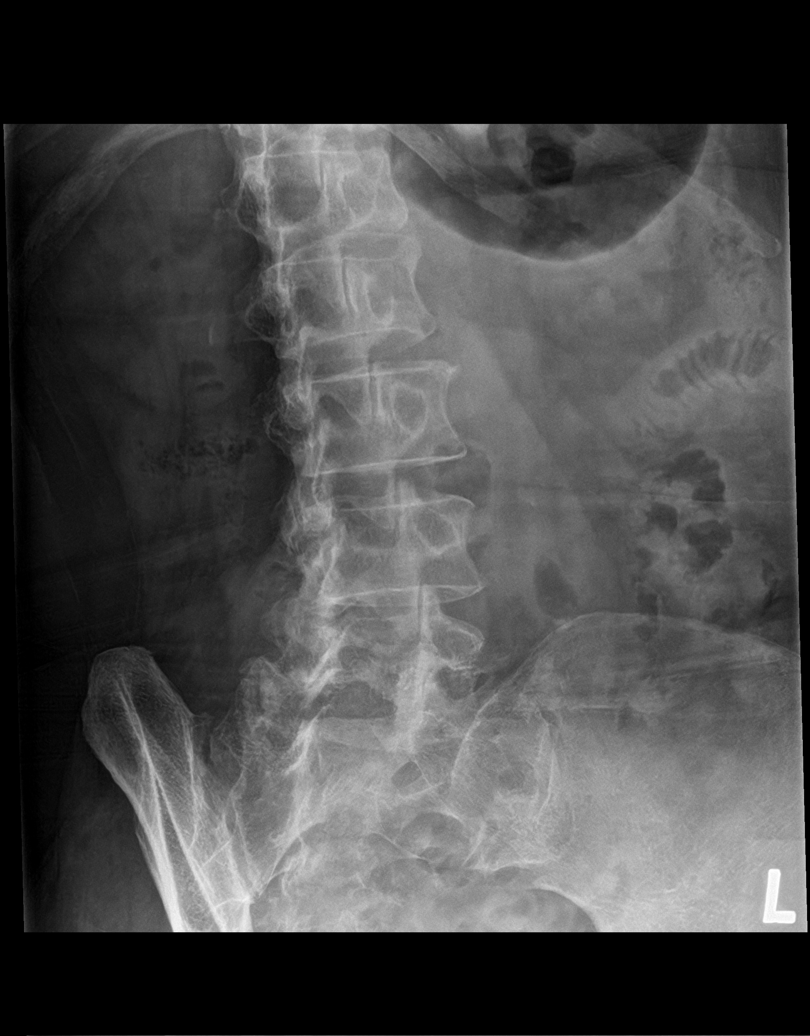

[l-spine lat]
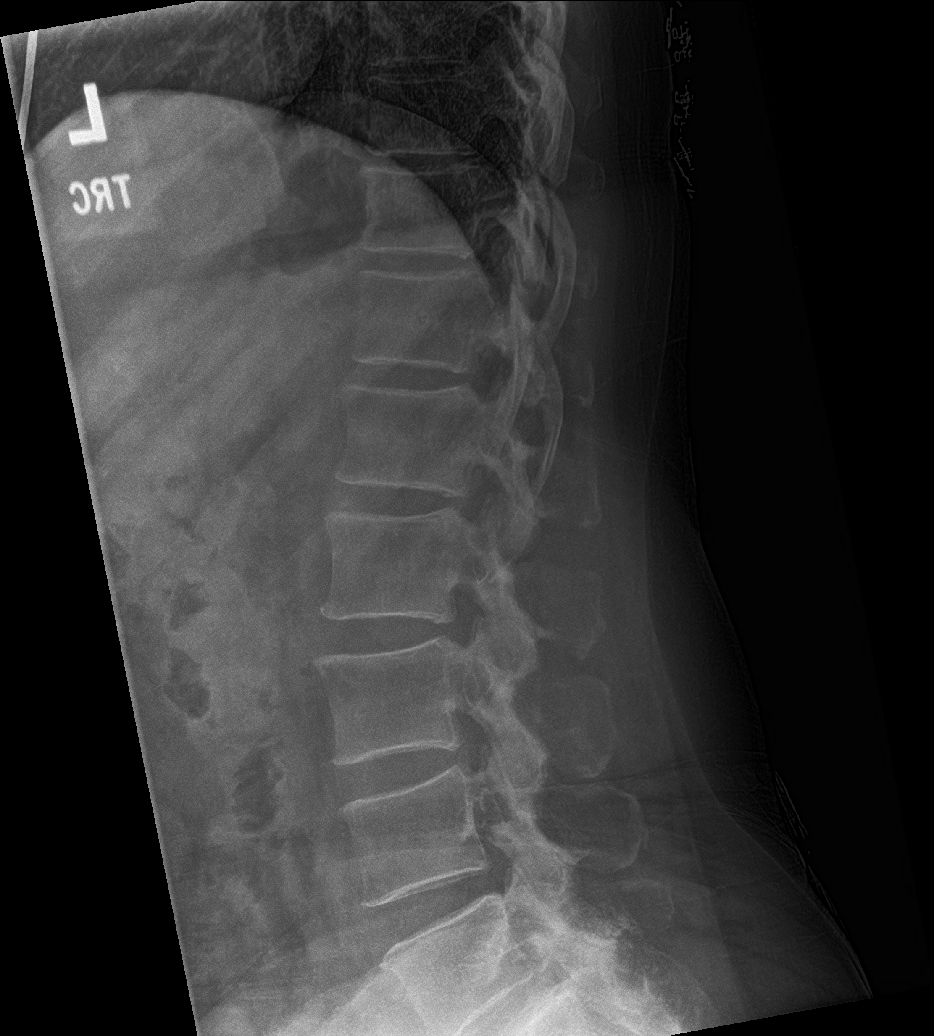

[l-spine spot]
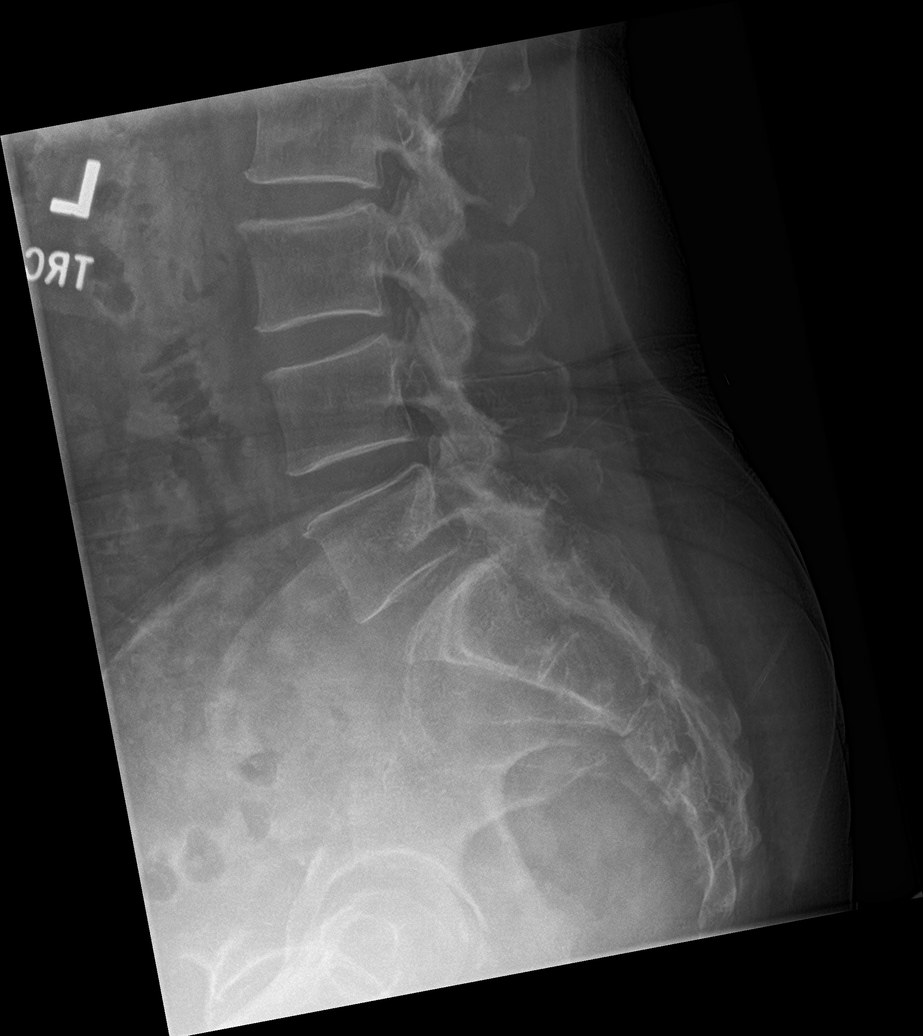

[5 of 5 positions shown; findings below may reference images not displayed]

FINDINGS: There is no evidence of lumbar spine fracture. Grade 1
anterolisthesis of L5 on S1. Mild multilevel degenerate disc disease
of the lumbar spine. Facet joint arthropathy at L5-S1.
IMPRESSION: 1.  No evidence of acute fracture.

2.  Mild multilevel degenerate disc disease.

## 2024-02-08 ENCOUNTER — Other Ambulatory Visit: Payer: Self-pay | Admitting: Family Medicine

## 2024-02-08 DIAGNOSIS — I10 Essential (primary) hypertension: Secondary | ICD-10-CM

## 2024-02-08 DIAGNOSIS — E785 Hyperlipidemia, unspecified: Secondary | ICD-10-CM

## 2024-03-13 ENCOUNTER — Encounter: Payer: Self-pay | Admitting: Family Medicine

## 2024-03-13 ENCOUNTER — Ambulatory Visit: Payer: Medicare PPO | Admitting: Family Medicine

## 2024-03-13 VITALS — BP 138/96 | HR 63 | Ht 60.0 in | Wt 143.6 lb

## 2024-03-13 DIAGNOSIS — J301 Allergic rhinitis due to pollen: Secondary | ICD-10-CM

## 2024-03-13 DIAGNOSIS — M8580 Other specified disorders of bone density and structure, unspecified site: Secondary | ICD-10-CM | POA: Diagnosis not present

## 2024-03-13 DIAGNOSIS — M1990 Unspecified osteoarthritis, unspecified site: Secondary | ICD-10-CM | POA: Diagnosis not present

## 2024-03-13 DIAGNOSIS — Z8249 Family history of ischemic heart disease and other diseases of the circulatory system: Secondary | ICD-10-CM | POA: Diagnosis not present

## 2024-03-13 DIAGNOSIS — Z860100 Personal history of colon polyps, unspecified: Secondary | ICD-10-CM | POA: Diagnosis not present

## 2024-03-13 DIAGNOSIS — E785 Hyperlipidemia, unspecified: Secondary | ICD-10-CM | POA: Diagnosis not present

## 2024-03-13 DIAGNOSIS — Z0000 Encounter for general adult medical examination without abnormal findings: Secondary | ICD-10-CM

## 2024-03-13 DIAGNOSIS — I10 Essential (primary) hypertension: Secondary | ICD-10-CM | POA: Diagnosis not present

## 2024-03-13 LAB — COMPREHENSIVE METABOLIC PANEL WITH GFR
ALT: 17 IU/L (ref 0–32)
AST: 21 IU/L (ref 0–40)
Albumin: 4.4 g/dL (ref 3.8–4.8)
Alkaline Phosphatase: 74 IU/L (ref 44–121)
BUN/Creatinine Ratio: 20 (ref 12–28)
BUN: 16 mg/dL (ref 8–27)
Bilirubin Total: 0.4 mg/dL (ref 0.0–1.2)
CO2: 20 mmol/L (ref 20–29)
Calcium: 10.4 mg/dL — ABNORMAL HIGH (ref 8.7–10.3)
Chloride: 105 mmol/L (ref 96–106)
Creatinine, Ser: 0.81 mg/dL (ref 0.57–1.00)
Globulin, Total: 2.4 g/dL (ref 1.5–4.5)
Glucose: 80 mg/dL (ref 70–99)
Potassium: 4.3 mmol/L (ref 3.5–5.2)
Sodium: 141 mmol/L (ref 134–144)
Total Protein: 6.8 g/dL (ref 6.0–8.5)
eGFR: 75 mL/min/{1.73_m2} (ref >59)

## 2024-03-13 LAB — CBC WITH DIFFERENTIAL/PLATELET
Basophils Absolute: 0 10*3/uL (ref 0.0–0.2)
Basos: 1 %
EOS (ABSOLUTE): 0.5 10*3/uL — ABNORMAL HIGH (ref 0.0–0.4)
Eos: 8 %
Hematocrit: 42.9 % (ref 34.0–46.6)
Hemoglobin: 14.3 g/dL (ref 11.1–15.9)
Immature Grans (Abs): 0 10*3/uL (ref 0.0–0.1)
Immature Granulocytes: 0 %
Lymphocytes Absolute: 2.2 10*3/uL (ref 0.7–3.1)
Lymphs: 34 %
MCH: 29.4 pg (ref 26.6–33.0)
MCHC: 33.3 g/dL (ref 31.5–35.7)
MCV: 88 fL (ref 79–97)
Monocytes Absolute: 0.6 10*3/uL (ref 0.1–0.9)
Monocytes: 9 %
Neutrophils Absolute: 3.2 10*3/uL (ref 1.4–7.0)
Neutrophils: 48 %
Platelets: 226 10*3/uL (ref 150–450)
RBC: 4.87 x10E6/uL (ref 3.77–5.28)
RDW: 13.2 % (ref 11.7–15.4)
WBC: 6.6 10*3/uL (ref 3.4–10.8)

## 2024-03-13 LAB — LIPID PANEL
Chol/HDL Ratio: 3.6 ratio (ref 0.0–4.4)
Cholesterol, Total: 171 mg/dL (ref 100–199)
HDL: 48 mg/dL (ref >39)
LDL Chol Calc (NIH): 95 mg/dL (ref 0–99)
Triglycerides: 159 mg/dL — ABNORMAL HIGH (ref 0–149)
VLDL Cholesterol Cal: 28 mg/dL (ref 5–40)

## 2024-03-13 MED ORDER — HYDROCHLOROTHIAZIDE 12.5 MG PO CAPS: 12.5000 mg | ORAL_CAPSULE | Freq: Every day | ORAL | 3 refills | Status: AC

## 2024-03-13 MED ORDER — ATORVASTATIN CALCIUM 40 MG PO TABS: 40.0000 mg | ORAL_TABLET | Freq: Every day | ORAL | 3 refills | Status: AC

## 2024-03-13 MED ORDER — VERAPAMIL HCL ER 240 MG PO TBCR: 240.0000 mg | EXTENDED_RELEASE_TABLET | Freq: Every day | ORAL | 3 refills | Status: AC

## 2024-03-13 NOTE — Patient Instructions (Signed)
  Danielle Yoder , Thank you for taking time to come for your Medicare Wellness Visit. I appreciate your ongoing commitment to your health goals. Please review the following plan we discussed and let me know if I can assist you in the future.   These are the goals we discussed:  Goals      Patient Stated     02/05/2022, wants to lose weight        This is a list of the screening recommended for you and due dates:  Health Maintenance  Topic Date Due   COVID-19 Vaccine (8 - 2024-25 season) 07/18/2023   Flu Shot  04/13/2024   Medicare Annual Wellness Visit  03/13/2025   DTaP/Tdap/Td vaccine (4 - Td or Tdap) 11/15/2031   Pneumococcal Vaccine for age over 103  Completed   DEXA scan (bone density measurement)  Completed   Hepatitis C Screening  Completed   Zoster (Shingles) Vaccine  Completed   Hepatitis B Vaccine  Aged Out   HPV Vaccine  Aged Out   Meningitis B Vaccine  Aged Out   Cologuard (Stool DNA test)  Discontinued

## 2024-03-13 NOTE — Progress Notes (Signed)
 Danielle Yoder is a 78 y.o. female who presents for annual wellness visit ,CPE and follow-up on chronic medical conditions.  She has no particular concerns or complaints.  She seems to be recovering nicely from right patellar fracture and is up walking regularly.  Continues on verapamil  and hydrochlorothiazide  as well as atorvastatin .  She does have a history of osteopenia and has been taking extra calcium .  She does have a history of colonic polyps however I do not see the record in there however she has had a negative Cologuard and will get another one next year.  Her allergies seem to be under good control.  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   Immunizations and Health Maintenance Immunization History  Administered Date(s) Administered   Fluad Quad(high Dose 65+) 07/01/2020   Influenza Split 05/31/2011, 09/26/2012   Influenza, High Dose Seasonal PF 09/18/2013, 05/17/2017, 05/20/2022   Influenza-Unspecified 09/24/2016, 05/25/2021, 06/13/2023   Moderna SARS-COV2 Booster Vaccination 06/07/2022   PFIZER Comirnaty(Gray Top)Covid-19 Tri-Sucrose Vaccine 12/24/2020, 05/23/2023   PFIZER(Purple Top)SARS-COV-2 Vaccination 10/04/2019, 10/25/2019, 06/03/2020   PNEUMOCOCCAL CONJUGATE-20 07/12/2023   Pfizer Covid-19 Vaccine Bivalent Booster 67yrs & up 06/09/2021   Pneumococcal Conjugate-13 03/25/2016   Pneumococcal Polysaccharide-23 04/30/2009, 01/07/2014   Rsv, Bivalent, Protein Subunit Rsvpref,pf Marlow) 06/30/2022   Tdap 03/13/2010, 02/02/2020, 11/14/2021   Zoster Recombinant(Shingrix) 01/12/2018, 04/24/2018   Zoster, Live 05/01/2008   Health Maintenance Due  Topic Date Due   COVID-19 Vaccine (8 - 2024-25 season) 07/18/2023   Medicare Annual Wellness (AWV)  02/24/2024    Last Pap smear: N/A Last mammogram: 12/14/2023 Last colonoscopy: 12/22/2005 and cologuard  Last DEXA: 08/10/2024 Dentist: within the last year Ophtho: within  the last year Exercise: walks daily  Other doctors caring for patient include:[facility]   Advanced directives: Does Patient Have a Medical Advance Directive?: Yes Type of Advance Directive: Healthcare Power of Attorney, Out of facility DNR (pink MOST or yellow form), Living will Does patient want to make changes to medical advance directive?: Yes (Inpatient - patient defers changing a medical advance directive and declines information at this time) Copy of Healthcare Power of Attorney in Chart?: Yes - validated most recent copy scanned in chart (See row information)  Depression screen:  See questionnaire below.     03/13/2024   10:02 AM 02/24/2023    9:28 AM 02/05/2022   12:01 PM 02/02/2021    9:40 AM 03/19/2020    8:56 AM  Depression screen PHQ 2/9  Decreased Interest 0 0 0 0 0  Down, Depressed, Hopeless 0 0 0 0 0  PHQ - 2 Score 0 0 0 0 0  Altered sleeping   0    Tired, decreased energy   0    Change in appetite   0    Feeling bad or failure about yourself    0    Trouble concentrating   0    Moving slowly or fidgety/restless   0    Suicidal thoughts   0    PHQ-9 Score   0    Difficult doing work/chores   Not difficult at all      Fall Risk Screen: see questionnaire below.    03/13/2024   10:02 AM 02/24/2023    9:28 AM 02/05/2022   12:00 PM 02/02/2021    9:39 AM 03/04/2020    4:09 PM  Fall Risk   Falls in the past year? 0 1 1  0 0  Comment   tripped due to a dog    Number falls in past yr: 0 0 1 0   Injury with Fall? 0 1 0 0   Risk for fall due to : No Fall Risks History of fall(s) Medication side effect No Fall Risks   Follow up Falls evaluation completed Falls evaluation completed Falls evaluation completed;Education provided;Falls prevention discussed  Falls evaluation completed       Data saved with a previous flowsheet row definition    ADL screen:  See questionnaire below Functional Status Survey: Is the patient deaf or have difficulty hearing?: No Does the patient have  difficulty seeing, even when wearing glasses/contacts?: No Does the patient have difficulty concentrating, remembering, or making decisions?: No Does the patient have difficulty walking or climbing stairs?: Yes Does the patient have difficulty dressing or bathing?: No Does the patient have difficulty doing errands alone such as visiting a doctor's office or shopping?: No   Review of Systems Constitutional: -, -unexpected weight change, -anorexia, -fatigue Allergy: -sneezing, -itching, -congestion Dermatology: denies changing moles, rash, lumps ENT: -runny nose, -ear pain, -sore throat,  Cardiology:  -chest pain, -palpitations, -orthopnea, Respiratory: -cough, -shortness of breath, -dyspnea on exertion, -wheezing,  Gastroenterology: -abdominal pain, -nausea, -vomiting, -diarrhea, -constipation, -dysphagia Hematology: -bleeding or bruising problems Musculoskeletal: -arthralgias, -myalgias, -joint swelling, -back pain, - Ophthalmology: -vision changes,  Urology: -dysuria, -difficulty urinating,  -urinary frequency, -urgency, incontinence Neurology: -, -numbness, , -memory loss, -falls, -dizziness    PHYSICAL EXAM:   General Appearance: Alert, cooperative, no distress, appears stated age Head: Normocephalic, without obvious abnormality, atraumatic Eyes: PERRL, conjunctiva/corneas clear, EOM's intact, fundi benign Ears: Normal TM's and external ear canals Nose: Nares normal, mucosa normal, no drainage or sinus tenderness Throat: Lips, mucosa, and tongue normal; teeth and gums normal Neck: Supple, no lymphadenopathy;  thyroid:  no enlargement/tenderness/nodules; no carotid bruit or JVD Lungs: Clear to auscultation bilaterally without wheezes, rales or ronchi; respirations unlabored Heart: Regular rate and rhythm, S1 and S2 normal, no murmur, rubor gallop Abdomen: Soft, non-tender, nondistended, normoactive bowel sounds,  no masses, no hepatosplenomegaly Extremities: No clubbing,  cyanosis or edema Pulses: 2+ and symmetric all extremities Skin:  Skin color, texture, turgor normal, no rashes or lesions Lymph nodes: Cervical, supraclavicular, and axillary nodes normal Neurologic:  CNII-XII intact, normal strength, sensation and gait; reflexes 2+ and symmetric throughout Psych: Normal mood, affect, hygiene and grooming.  ASSESSMENT/PLAN: Routine general medical examination at a health care facility  Osteopenia, unspecified location - Plan: DG Bone Density  Primary hypertension - Plan: CBC with Differential/Platelet, Comprehensive metabolic panel with GFR  Hyperlipidemia with target LDL less than 100 - Plan: Lipid panel, atorvastatin  (LIPITOR) 40 MG tablet  History of colonic polyps  Family history of heart disease in female family member before age 63  Arthritis  Seasonal allergic rhinitis due to pollen  Essential hypertension - Plan: hydrochlorothiazide  (MICROZIDE ) 12.5 MG capsule, verapamil  (CALAN -SR) 240 MG CR tablet    Discussed breast exams and yearly mammograms; at least 30 minutes of aerobic activity at least 5 days/week and weight-bearing exercise 2x/week;  vitamin D  intake a Immunization recommendations discussed.  Colonoscopy recommendations reviewed   Medicare Attestation I have personally reviewed: The patient's medical and social history Their use of alcohol, tobacco or illicit drugs Their current medications and supplements The patient's functional ability including ADLs,fall risks, home safety risks, cognitive, and hearing and visual impairment Diet and physical activities Evidence for depression or mood disorders  The patient's  weight, height, and BMI have been recorded in the chart.  I have made referrals, counseling, and provided education to the patient based on review of the above and I have provided the patient with a written personalized care plan for preventive services.     Norleen Jobs, MD   03/13/2024

## 2024-03-14 ENCOUNTER — Ambulatory Visit: Payer: Self-pay | Admitting: Family Medicine

## 2024-04-09 ENCOUNTER — Ambulatory Visit: Admitting: Family Medicine

## 2024-04-09 ENCOUNTER — Ambulatory Visit: Payer: Self-pay

## 2024-04-09 ENCOUNTER — Encounter: Payer: Self-pay | Admitting: Family Medicine

## 2024-04-09 VITALS — BP 140/88 | HR 76 | Wt 144.4 lb

## 2024-04-09 DIAGNOSIS — L858 Other specified epidermal thickening: Secondary | ICD-10-CM

## 2024-04-09 NOTE — Telephone Encounter (Signed)
  Reason for Disposition  [1] Follow-up call from patient regarding patient's clinical status AND [2] information NON-URGENT  Answer Assessment - Initial Assessment Questions 1. REASON FOR CALL or QUESTION: What is your reason for calling today? or How can I best     Patient states her pharmacies do not have 20%. Please have your staff contact pt and advise on next recommendation. States they do have OTC but it is cream form and she wants to confirm this is appropriate.  Protocols used: PCP Call - No Triage-A-AH Copied from CRM [id number]. Topic: Clinical - Medication Question >> Apr 09, 2024  1:36 PM Rea C wrote: Reason for CRM: Patient is currently at [facility]. She said she's been [facility] so far and was prescribed by Dr. Vita to get a urea 20% ointment over the counter. She said they don't have the 20% but only 10% and she is wondering should she still get that or are there any other options.

## 2024-04-09 NOTE — Progress Notes (Signed)
   Name: Danielle Yoder   Date of Visit: 04/09/24   Date of last visit with me: Visit date not found   CHIEF COMPLAINT:  Chief Complaint  Patient presents with   Acute Visit    Itchy rash. On both arms, not sure if its sunburn or what's going on.        HPI:  Patient presenting with bilateral itching of the dorsal side of the forearms.  Patient notes that it has been going on for a few days.  Patient states that he tried Benadryl  last night but that only made her sleepy.  Patient notes that usually occurs at night but can also occur during the day.  Patient notes some mild skin changes but no redness.  Patient otherwise has no other concerns at this time.  OBJECTIVE:   BP Readings from Last 3 Encounters:  04/09/24 (!) 140/88  03/13/24 (!) 138/96  02/24/23 (!) 164/82    BP (!) 140/88   Pulse 76   Wt 144 lb 6.4 oz (65.5 kg)   BMI 28.20 kg/m    Physical Exam Skin:    Comments: Inspection reveals some keratosis pilaris noted on the bilateral dorsal side of the forearms.  No redness, erythema or signs of any infection.     ASSESSMENT/PLAN:   Assessment & Plan Keratosis pilaris - Suspect patient is likely dealing with keratosis pilaris at this time.  Will go ahead and do a urea 20% ointment and have patient apply it to both arms daily, will have patient follow-up in 2 to 3 weeks if no improvement or as needed. - Patient to call if issues with medication.    Kalasia Yoder A. Vita MD [facility]

## 2024-04-10 ENCOUNTER — Telehealth: Payer: Self-pay | Admitting: *Deleted

## 2024-04-10 ENCOUNTER — Other Ambulatory Visit: Payer: Self-pay | Admitting: *Deleted

## 2024-04-10 NOTE — Telephone Encounter (Signed)
 Patient seen yesterday. Cannot find 20% urea ointment, (I am assuming does not want to order online). She found 10% cream and states is does not work. Her [facility] does not carry, nor does [facility]. I called [facility] and they can compound a 25% or 30% (can't do anything commercially offered) ointment 120grams. Please advise if you would like to try this. Thanks.

## 2024-04-10 NOTE — Telephone Encounter (Signed)
 Called in 120grams to [facility].

## 2024-05-30 DIAGNOSIS — H43813 Vitreous degeneration, bilateral: Secondary | ICD-10-CM | POA: Diagnosis not present

## 2024-05-30 DIAGNOSIS — H25813 Combined forms of age-related cataract, bilateral: Secondary | ICD-10-CM | POA: Diagnosis not present

## 2024-07-16 ENCOUNTER — Encounter: Payer: Self-pay | Admitting: Radiology

## 2025-03-18 ENCOUNTER — Ambulatory Visit: Payer: Self-pay | Admitting: Family Medicine
